# Patient Record
Sex: Male | Born: 1965 | Race: White | Hispanic: No | Marital: Married | Smoking: Never smoker
Health system: Southern US, Community
[De-identification: ages and names within clinical notes are randomized; demographics above are authoritative.]

## PROBLEM LIST (undated history)

## (undated) DIAGNOSIS — E785 Hyperlipidemia, unspecified: Secondary | ICD-10-CM

## (undated) DIAGNOSIS — E669 Obesity, unspecified: Secondary | ICD-10-CM

## (undated) DIAGNOSIS — R768 Other specified abnormal immunological findings in serum: Secondary | ICD-10-CM

## (undated) DIAGNOSIS — M79641 Pain in right hand: Secondary | ICD-10-CM

## (undated) DIAGNOSIS — Z0000 Encounter for general adult medical examination without abnormal findings: Secondary | ICD-10-CM

## (undated) DIAGNOSIS — G709 Myoneural disorder, unspecified: Secondary | ICD-10-CM

## (undated) DIAGNOSIS — R519 Headache, unspecified: Secondary | ICD-10-CM

## (undated) DIAGNOSIS — M545 Low back pain: Secondary | ICD-10-CM

## (undated) DIAGNOSIS — F32A Depression, unspecified: Secondary | ICD-10-CM

## (undated) DIAGNOSIS — T753XXA Motion sickness, initial encounter: Secondary | ICD-10-CM

## (undated) DIAGNOSIS — M25561 Pain in right knee: Secondary | ICD-10-CM

## (undated) DIAGNOSIS — Z9889 Other specified postprocedural states: Secondary | ICD-10-CM

## (undated) DIAGNOSIS — N529 Male erectile dysfunction, unspecified: Secondary | ICD-10-CM

## (undated) DIAGNOSIS — G959 Disease of spinal cord, unspecified: Secondary | ICD-10-CM

## (undated) DIAGNOSIS — R112 Nausea with vomiting, unspecified: Secondary | ICD-10-CM

## (undated) DIAGNOSIS — Z86010 Personal history of colonic polyps: Secondary | ICD-10-CM

## (undated) DIAGNOSIS — R51 Headache: Secondary | ICD-10-CM

## (undated) DIAGNOSIS — G4730 Sleep apnea, unspecified: Secondary | ICD-10-CM

## (undated) DIAGNOSIS — R42 Dizziness and giddiness: Secondary | ICD-10-CM

## (undated) HISTORY — DX: Other specified abnormal immunological findings in serum: R76.8

## (undated) HISTORY — DX: Personal history of colonic polyps: Z86.010

## (undated) HISTORY — DX: Pain in right knee: M25.561

## (undated) HISTORY — PX: POLYPECTOMY: SHX149

## (undated) HISTORY — DX: Myoneural disorder, unspecified: G70.9

## (undated) HISTORY — DX: Dizziness and giddiness: R42

## (undated) HISTORY — PX: SPINE SURGERY: SHX786

## (undated) HISTORY — DX: Other specified postprocedural states: Z98.890

## (undated) HISTORY — DX: Depression, unspecified: F32.A

## (undated) HISTORY — DX: Obesity, unspecified: E66.9

## (undated) HISTORY — DX: Pain in right hand: M79.641

## (undated) HISTORY — DX: Encounter for general adult medical examination without abnormal findings: Z00.00

## (undated) HISTORY — DX: Motion sickness, initial encounter: T75.3XXA

## (undated) HISTORY — DX: Low back pain: M54.5

## (undated) HISTORY — PX: SKIN BIOPSY: SHX1

## (undated) HISTORY — DX: Hyperlipidemia, unspecified: E78.5

## (undated) HISTORY — DX: Headache: R51

## (undated) HISTORY — DX: Sleep apnea, unspecified: G47.30

## (undated) HISTORY — DX: Other specified postprocedural states: R11.2

## (undated) HISTORY — PX: VARICOCELE EXCISION: SUR582

## (undated) HISTORY — PX: VASECTOMY: SHX75

## (undated) HISTORY — DX: Headache, unspecified: R51.9

## (undated) HISTORY — DX: Male erectile dysfunction, unspecified: N52.9

---

## 2009-04-29 LAB — HM COLONOSCOPY

## 2010-04-29 HISTORY — PX: COLONOSCOPY: SHX174

## 2014-03-15 ENCOUNTER — Encounter (HOSPITAL_BASED_OUTPATIENT_CLINIC_OR_DEPARTMENT_OTHER): Payer: Self-pay

## 2014-03-15 ENCOUNTER — Emergency Department (HOSPITAL_BASED_OUTPATIENT_CLINIC_OR_DEPARTMENT_OTHER): Payer: 59

## 2014-03-15 ENCOUNTER — Emergency Department (HOSPITAL_BASED_OUTPATIENT_CLINIC_OR_DEPARTMENT_OTHER)
Admission: EM | Admit: 2014-03-15 | Discharge: 2014-03-15 | Disposition: A | Discharge status: Home / Self Care | Payer: 59 | Attending: Emergency Medicine | Admitting: Emergency Medicine

## 2014-03-15 DIAGNOSIS — R110 Nausea: Secondary | ICD-10-CM | POA: Diagnosis not present

## 2014-03-15 DIAGNOSIS — R51 Headache: Secondary | ICD-10-CM | POA: Insufficient documentation

## 2014-03-15 DIAGNOSIS — H538 Other visual disturbances: Secondary | ICD-10-CM | POA: Diagnosis not present

## 2014-03-15 DIAGNOSIS — R42 Dizziness and giddiness: Secondary | ICD-10-CM

## 2014-03-15 LAB — CBC WITH DIFFERENTIAL/PLATELET
Basophils Absolute: 0 10*3/uL (ref 0.0–0.1)
Basophils Relative: 0 % (ref 0–1)
Eosinophils Absolute: 0.2 10*3/uL (ref 0.0–0.7)
Eosinophils Relative: 4 % (ref 0–5)
HEMATOCRIT: 43.5 % (ref 39.0–52.0)
Hemoglobin: 14.9 g/dL (ref 13.0–17.0)
LYMPHS PCT: 26 % (ref 12–46)
Lymphs Abs: 1.5 10*3/uL (ref 0.7–4.0)
MCH: 31.3 pg (ref 26.0–34.0)
MCHC: 34.3 g/dL (ref 30.0–36.0)
MCV: 91.4 fL (ref 78.0–100.0)
MONO ABS: 0.5 10*3/uL (ref 0.1–1.0)
MONOS PCT: 9 % (ref 3–12)
NEUTROS ABS: 3.6 10*3/uL (ref 1.7–7.7)
Neutrophils Relative %: 61 % (ref 43–77)
Platelets: 215 10*3/uL (ref 150–400)
RBC: 4.76 MIL/uL (ref 4.22–5.81)
RDW: 12.6 % (ref 11.5–15.5)
WBC: 5.9 10*3/uL (ref 4.0–10.5)

## 2014-03-15 LAB — COMPREHENSIVE METABOLIC PANEL
ALT: 18 U/L (ref 0–53)
AST: 21 U/L (ref 0–37)
Albumin: 4.2 g/dL (ref 3.5–5.2)
Alkaline Phosphatase: 80 U/L (ref 39–117)
Anion gap: 13 (ref 5–15)
BUN: 14 mg/dL (ref 6–23)
CO2: 27 mEq/L (ref 19–32)
Calcium: 9.4 mg/dL (ref 8.4–10.5)
Chloride: 102 mEq/L (ref 96–112)
Creatinine, Ser: 1 mg/dL (ref 0.50–1.35)
GFR calc Af Amer: >90 mL/min (ref >90)
GFR calc non Af Amer: 86 mL/min — ABNORMAL LOW (ref >90)
Glucose, Bld: 97 mg/dL (ref 70–99)
Potassium: 3.9 mEq/L (ref 3.7–5.3)
Sodium: 142 mEq/L (ref 137–147)
Total Bilirubin: 0.8 mg/dL (ref 0.3–1.2)
Total Protein: 7.5 g/dL (ref 6.0–8.3)

## 2014-03-15 NOTE — ED Notes (Signed)
Pt ambulated independently to vision chart, denied dizziness.

## 2014-03-15 NOTE — Discharge Instructions (Signed)
Your CT scan today does not show bleeding into your brain or a tumor, however, it is important that you follow up with a Neurologist for further evaluation of your dizziness.

## 2014-03-15 NOTE — ED Provider Notes (Signed)
CSN: [id number]     Arrival date & time 03/15/14  1348 History   First MD Initiated Contact with Patient 03/15/14 1414     Chief Complaint  Patient presents with  . Dizziness     (Consider location/radiation/quality/duration/timing/severity/associated sxs/prior Treatment) Patient is a 48 y.o. male presenting with dizziness.  Dizziness Onset quality:  Gradual Duration:  6 weeks Timing:  Intermittent Progression:  Worsening Chronicity:  New Context: head movement   Relieved by:  Closing eyes Worsened by:  Movement Associated symptoms: headaches, nausea and vision changes   Associated symptoms: no chest pain, no diarrhea, no palpitations, no shortness of breath and no vomiting    Ronald Griffin is a 48 y.o. male who presents to the ED with dizziness that has been off and on x 6 weeks. He describes the symptoms as nausea with a dizzy feeling that comes and goes with occasional episode of blurred vision but other times no visual problems at all. He describes his symptoms as like car sickness with a mild headache. The light headache is constant but the dizziness comes and goes. He denies any focal weakness. Patient went to the eye doctor and and had a check up and they recommended OTC eye glasses but nothing else. Patient does not currently have a PCP. He move here from [place] about a year ago and has not had any problems.   History reviewed. No pertinent past medical history. Past Surgical History  Procedure Laterality Date  . Varicocele excision     No family history on file. History  Substance Use Topics  . Smoking status: Never Smoker   . Smokeless tobacco: Not on file  . Alcohol Use: Yes     Comment: weekly    Review of Systems  Constitutional: Negative for fever and chills.  Eyes: Positive for visual disturbance. Negative for photophobia, pain and itching.  Respiratory: Negative for cough, chest tightness, shortness of breath and wheezing.   Cardiovascular: Negative for chest  pain, palpitations and leg swelling.  Gastrointestinal: Positive for nausea. Negative for vomiting, abdominal pain, diarrhea and constipation.  Genitourinary: Negative for dysuria, frequency and decreased urine volume.  Musculoskeletal: Negative for myalgias, back pain and joint swelling.  Skin: Negative for rash.  Neurological: Positive for dizziness, light-headedness and headaches. Negative for seizures, syncope, facial asymmetry and speech difficulty.  Psychiatric/Behavioral: Negative for confusion. The patient is not nervous/anxious.       Allergies  Review of patient's allergies indicates no known allergies.  Home Medications   Prior to Admission medications   Not on File   BP 121/72 mmHg  Pulse 57  Temp(Src) 98.8 F (37.1 C) (Oral)  Resp 18  Ht 5\' 10"  (1.778 m)  Wt 221 lb (100.245 kg)  BMI 31.71 kg/m2  SpO2 97% Physical Exam  Constitutional: He is oriented to person, place, and time. He appears well-developed and well-nourished. No distress.  HENT:  Head: Normocephalic and atraumatic.  Right Ear: Tympanic membrane normal.  Left Ear: Tympanic membrane normal.  Nose: Nose normal.  Mouth/Throat: Uvula is midline, oropharynx is clear and moist and mucous membranes are normal.  Eyes: Conjunctivae and EOM are normal.  Neck: Normal range of motion. Neck supple.  Cardiovascular: Normal rate and regular rhythm.   Pulmonary/Chest: Effort normal. He has no wheezes. He has no rales.  Abdominal: Soft. Bowel sounds are normal. He exhibits no mass. There is no tenderness.  Musculoskeletal: He exhibits no edema.  Radial and pedal pulses strong, adequate circulation, good touch  sensation.  Neurological: He is alert and oriented to person, place, and time. He has normal strength. No cranial nerve deficit or sensory deficit. He displays a negative Romberg sign. Gait normal.  Reflex Scores:      Bicep reflexes are 2+ on the right side and 2+ on the left side.      Brachioradialis  reflexes are 2+ on the right side and 2+ on the left side.      Patellar reflexes are 2+ on the right side and 2+ on the left side.      Achilles reflexes are 2+ on the right side and 2+ on the left side. Rapid alternating movement without difficulty. Stands on one foot without difficulty. Patient felt dizzy when he bent forward and then stood up.  Psychiatric: He has a normal mood and affect. His behavior is normal.    ED Course  Procedures  I discussed this case with Dr. Colin Rhein.  Results for orders placed or performed during the hospital encounter of 03/15/14 (from the past 24 hour(s))  CBC with Differential     Status: None   Collection Time: 03/15/14  2:15 PM  Result Value Ref Range   WBC 5.9 4.0 - 10.5 K/uL   RBC 4.76 4.22 - 5.81 MIL/uL   Hemoglobin 14.9 13.0 - 17.0 g/dL   HCT 43.5 39.0 - 52.0 %   MCV 91.4 78.0 - 100.0 fL   MCH 31.3 26.0 - 34.0 pg   MCHC 34.3 30.0 - 36.0 g/dL   RDW 12.6 11.5 - 15.5 %   Platelets 215 150 - 400 K/uL   Neutrophils Relative % 61 43 - 77 %   Neutro Abs 3.6 1.7 - 7.7 K/uL   Lymphocytes Relative 26 12 - 46 %   Lymphs Abs 1.5 0.7 - 4.0 K/uL   Monocytes Relative 9 3 - 12 %   Monocytes Absolute 0.5 0.1 - 1.0 K/uL   Eosinophils Relative 4 0 - 5 %   Eosinophils Absolute 0.2 0.0 - 0.7 K/uL   Basophils Relative 0 0 - 1 %   Basophils Absolute 0.0 0.0 - 0.1 K/uL  Comprehensive metabolic panel     Status: Abnormal   Collection Time: 03/15/14  2:15 PM  Result Value Ref Range   Sodium 142 137 - 147 mEq/L   Potassium 3.9 3.7 - 5.3 mEq/L   Chloride 102 96 - 112 mEq/L   CO2 27 19 - 32 mEq/L   Glucose, Bld 97 70 - 99 mg/dL   BUN 14 6 - 23 mg/dL   Creatinine, Ser 1.00 0.50 - 1.35 mg/dL   Calcium 9.4 8.4 - 10.5 mg/dL   Total Protein 7.5 6.0 - 8.3 g/dL   Albumin 4.2 3.5 - 5.2 g/dL   AST 21 0 - 37 U/L   ALT 18 0 - 53 U/L   Alkaline Phosphatase 80 39 - 117 U/L   Total Bilirubin 0.8 0.3 - 1.2 mg/dL   GFR calc non Af Amer 86 (L) >90 mL/min   GFR calc Af  Amer >90 >90 mL/min   Anion gap 13 5 - 15    Ct Head Wo Contrast  03/15/2014   CLINICAL DATA:  Headaches, dizziness, no head injury  EXAM: CT HEAD WITHOUT CONTRAST  TECHNIQUE: Contiguous axial images were obtained from the base of the skull through the vertex without intravenous contrast.  COMPARISON:  None.  FINDINGS: There is no evidence of mass effect, midline shift or extra-axial fluid collections. There is  no evidence of a space-occupying lesion or intracranial hemorrhage. There is no evidence of a cortical-based area of acute infarction.  The ventricles and sulci are appropriate for the patient's age. The basal cisterns are patent.  Visualized portions of the orbits are unremarkable. The visualized portions of the paranasal sinuses and mastoid air cells are unremarkable.  The osseous structures are unremarkable.  IMPRESSION: Normal CT of the brain without intravenous contrast.   Electronically Signed   By: Kathreen Devoid   On: 03/15/2014 16:41    MDM  48 y.o. male with intermittent dizziness x 6 weeks. Stable for discharge without neuro deficits. I have reviewed this patient's vital signs, nurses notes, appropriate labs and imaging.  I have discussed findings with the patient. I also discussed the importance of follow up with a neurologist for further evaluation and the patient voices understanding and agrees with plan. He will call for an appointment. BP 121/72 mmHg  Pulse 57  Temp(Src) 98.8 F (37.1 C) (Oral)  Resp 18  Ht 5\' 10"  (1.778 m)  Wt 221 lb (100.245 kg)  BMI 31.71 kg/m2  SpO2 97%     [facility], NP 03/15/14 2207  Debby Freiberg, MD 03/16/14 [phone]

## 2014-03-15 NOTE — ED Notes (Signed)
Intermittent dizziness x 6 weeks.  Symptoms seem worse today and associated with blurred vision.

## 2014-03-18 NOTE — ED Notes (Signed)
[name] called to confirm that he did have a referral to [facility].  Reviewed discharge instructions.

## 2014-03-29 ENCOUNTER — Ambulatory Visit (INDEPENDENT_AMBULATORY_CARE_PROVIDER_SITE_OTHER): Payer: 59 | Admitting: Neurology

## 2014-03-29 ENCOUNTER — Encounter: Payer: Self-pay | Admitting: Neurology

## 2014-03-29 VITALS — BP 102/62 | HR 53 | Ht 70.0 in | Wt 230.0 lb

## 2014-03-29 DIAGNOSIS — H538 Other visual disturbances: Secondary | ICD-10-CM | POA: Insufficient documentation

## 2014-03-29 DIAGNOSIS — R51 Headache: Secondary | ICD-10-CM

## 2014-03-29 DIAGNOSIS — R519 Headache, unspecified: Secondary | ICD-10-CM | POA: Insufficient documentation

## 2014-03-29 DIAGNOSIS — R42 Dizziness and giddiness: Secondary | ICD-10-CM

## 2014-03-29 HISTORY — DX: Headache, unspecified: R51.9

## 2014-03-29 NOTE — Progress Notes (Signed)
PATIENT: Ronald Griffin DOB: 11/21/1965  HISTORICAL  Ronald Griffin is a 48 years old right-handed male, referred by emergency room for evaluation of dizziness  He was previously healthy, in March 15 2014, he presented to the emergency room for few weeks history of feeling dizzy, lightheaded, unsteady gait, blurry vision, sometimes worse than the others, also with frequent headaches, CBC CMP was normal, CT head without contrast was normal  Symptom onset was since February 10 2014, progressive, getting more frequent, he works as a International aid/development worker, [place]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   No current facility-administered medications on file prior to visit.    PAST MEDICAL HISTORY: Past Medical History  Diagnosis Date  . Dizziness     PAST SURGICAL HISTORY: Past Surgical History  Procedure Laterality Date  . Varicocele excision      FAMILY HISTORY: Family History  Problem Relation Age of Onset  . Cancer Mother   . Lung cancer Mother   . Breast cancer Mother   . Bipolar disorder Father     SOCIAL HISTORY:  History   Social History  . Marital Status: Married    Spouse Name: [name]    Number of Children: 2  . Years of Education: college   Occupational History  . Juvenile detention   Social History Main Topics  . Smoking status: Never Smoker   . Smokeless tobacco: Never Used  . Alcohol Use: 1.2 oz/week    2 Cans of beer per week     Comment: weekly  . Drug Use: No  . Sexual Activity: Not on file   Other Topics Concern  . Not on file   Social History Narrative   Patient works full time for [employer]. Patient lives at home with his wife  [name]).   Education college.   Right handed.   Caffeine 4-6 cups daily.      PHYSICAL EXAM   Filed Vitals:   03/29/14 0923  BP: 102/62  Pulse: 53  Height: 5\' 10"  (1.778 m)  Weight: 230 lb (104.327 kg)    Not recorded     Blood pressure lying down 130/ out of 80, standing up 130/ 90 Body mass index is 33 kg/(m^2).   Generalized: In no acute distress  Neck: Supple, no carotid bruits   Cardiac: Regular rate rhythm  Pulmonary: Clear to auscultation bilaterally  Musculoskeletal: No deformity  Neurological examination  Mentation: Alert oriented to time, place, history taking, and causual conversation  Cranial nerve II-XII: Pupils were equal round reactive to light. Extraocular movements were full.  Visual field were full on confrontational test. Bilateral fundi were sharp.  Facial sensation and strength were normal. Hearing was intact to finger rubbing bilaterally. Uvula tongue midline.  Head turning and shoulder shrug and were normal and symmetric.Tongue protrusion into cheek strength was normal.  Motor: Normal tone, bulk and strength.  Sensory: Intact to fine touch, pinprick, preserved vibratory sensation, and proprioception at toes.  Coordination: Normal finger to nose, heel-to-shin bilaterally there was no truncal ataxia  Gait: Rising up from seated position without assistance, normal stance, without trunk ataxia, moderate stride, good arm swing, smooth turning, able to perform tiptoe, and heel walking  without difficulty.   Romberg signs: Negative  Deep tendon reflexes: Brachioradialis 2/2, biceps 2/2, triceps 2/2, patellar 2/2, Achilles 2/2, plantar responses were flexor bilaterally.   DIAGNOSTIC DATA (LABS, IMAGING, TESTING) - I reviewed patient records, labs, notes, testing and imaging myself where available.  Lab Results  Component Value Date   WBC 5.9 03/15/2014   HGB 14.9 03/15/2014   HCT 43.5 03/15/2014   MCV 91.4 03/15/2014   PLT 215 03/15/2014       Component Value Date/Time   NA 142 03/15/2014 1415   K 3.9 03/15/2014 1415   CL 102 03/15/2014 1415   CO2 27 03/15/2014 1415   GLUCOSE 97 03/15/2014 1415   BUN 14 03/15/2014 1415   CREATININE 1.00 03/15/2014 1415   CALCIUM 9.4 03/15/2014 1415   PROT 7.5 03/15/2014 1415   ALBUMIN 4.2 03/15/2014 1415   AST 21 03/15/2014 1415   ALT 18 03/15/2014 1415   ALKPHOS 80 03/15/2014 1415   BILITOT 0.8 03/15/2014 1415   GFRNONAA 86* 03/15/2014 1415   GFRAA >90 03/15/2014 1415   ASSESSMENT AND PLAN  Ronald Griffin is a 48 y.o. male complains of  few months history of intermittent dizziness, not feeling well, frequent headaches, Normal neurological examination, there was no orthostatic blood pressure changes,  1, need to rule out central nervous system, brainstem/cerebellum structural lesions, MRI of brain  2. Laboratory evaluations 3. Return to clinic in 2 weeks   Marcial Pacas, M.D. Ph.D.  [facility] [address], [place], [place] [address] [phone]

## 2014-03-31 ENCOUNTER — Telehealth: Payer: Self-pay | Admitting: Neurology

## 2014-03-31 NOTE — Telephone Encounter (Signed)
I have called him, left message, may repeat lab test for positive ANA, SSB, keep follow up appt in Dec 15th

## 2014-04-12 ENCOUNTER — Encounter: Payer: Self-pay | Admitting: *Deleted

## 2014-04-12 ENCOUNTER — Ambulatory Visit: Payer: 59 | Admitting: Neurology

## 2014-04-12 LAB — ANA W/REFLEX IF POSITIVE
ANA: POSITIVE — AB
Anti JO-1: <0.2 AI (ref 0.0–0.9)
Centromere Ab Screen: <0.2 AI (ref 0.0–0.9)
Chromatin Ab SerPl-aCnc: <0.2 AI (ref 0.0–0.9)
ENA RNP Ab: 0.2 AI (ref 0.0–0.9)
ENA SSA (RO) Ab: <0.2 AI (ref 0.0–0.9)
ENA SSB (LA) Ab: 1.3 AI — ABNORMAL HIGH (ref 0.0–0.9)
dsDNA Ab: <1 IU/mL (ref 0–9)

## 2014-04-12 LAB — C-REACTIVE PROTEIN: CRP: <0.1 mg/L (ref 0.0–4.9)

## 2014-04-12 LAB — THYROID PANEL WITH TSH
FREE THYROXINE INDEX: 1.5 (ref 1.2–4.9)
T3 Uptake Ratio: 30 % (ref 24–39)
T4, Total: 5.1 ug/dL (ref 4.5–12.0)
TSH: 3.39 u[IU]/mL (ref 0.450–4.500)

## 2014-04-12 LAB — ACETYLCHOLINE RECEPTOR, MODULATING

## 2014-04-12 LAB — ACETYLCHOLINE RECEPTOR, BINDING

## 2014-04-12 LAB — SEDIMENTATION RATE: Sed Rate: 6 mm/hr (ref 0–30)

## 2014-04-12 LAB — VITAMIN B12: VITAMIN B 12: 379 pg/mL (ref 211–946)

## 2014-04-15 ENCOUNTER — Telehealth: Payer: Self-pay | Admitting: Neurology

## 2014-04-15 NOTE — Telephone Encounter (Signed)
I have called and left message, will try again later for blood test result, only abnormality is positive ANA, SSB,   MRI brain is pending

## 2014-04-20 ENCOUNTER — Ambulatory Visit (INDEPENDENT_AMBULATORY_CARE_PROVIDER_SITE_OTHER): Payer: 59

## 2014-04-20 DIAGNOSIS — R42 Dizziness and giddiness: Secondary | ICD-10-CM

## 2014-04-20 DIAGNOSIS — H538 Other visual disturbances: Secondary | ICD-10-CM

## 2014-04-27 ENCOUNTER — Telehealth: Payer: Self-pay

## 2014-04-27 NOTE — Progress Notes (Signed)
Quick Note:  Spoke to patient told him Normal MRI. ______

## 2014-04-27 NOTE — Telephone Encounter (Signed)
Spoke to patient and told him MRI was Normal and he wants to know what the next step is.

## 2014-05-03 NOTE — Telephone Encounter (Signed)
I have called patient, he has tried Estate agent, he feels better.  MRI brain is normal.  Only abnormality is positive ANA, SSB,  He is in the process of finding a primary care physician, I have advised him continue moderate exercise, relay laboratory and imaging findings to his primary care physician

## 2014-11-18 ENCOUNTER — Telehealth: Payer: Self-pay | Admitting: Behavioral Health

## 2014-11-18 NOTE — Telephone Encounter (Signed)
Unable to reach patient at time of Pre-Visit Call.  Left message for patient to return call when available.    

## 2014-11-21 ENCOUNTER — Ambulatory Visit (INDEPENDENT_AMBULATORY_CARE_PROVIDER_SITE_OTHER): Payer: 59 | Admitting: Family Medicine

## 2014-11-21 ENCOUNTER — Encounter: Payer: Self-pay | Admitting: Family Medicine

## 2014-11-21 VITALS — BP 110/76 | HR 60 | Temp 98.0°F | Resp 18 | Ht 71.0 in | Wt 222.0 lb

## 2014-11-21 DIAGNOSIS — T753XXA Motion sickness, initial encounter: Secondary | ICD-10-CM

## 2014-11-21 DIAGNOSIS — Z860100 Personal history of colon polyps, unspecified: Secondary | ICD-10-CM

## 2014-11-21 DIAGNOSIS — M25561 Pain in right knee: Secondary | ICD-10-CM | POA: Insufficient documentation

## 2014-11-21 DIAGNOSIS — R768 Other specified abnormal immunological findings in serum: Secondary | ICD-10-CM

## 2014-11-21 DIAGNOSIS — E785 Hyperlipidemia, unspecified: Secondary | ICD-10-CM | POA: Diagnosis not present

## 2014-11-21 DIAGNOSIS — Z8042 Family history of malignant neoplasm of prostate: Secondary | ICD-10-CM

## 2014-11-21 DIAGNOSIS — Z0000 Encounter for general adult medical examination without abnormal findings: Secondary | ICD-10-CM | POA: Diagnosis not present

## 2014-11-21 DIAGNOSIS — Z1211 Encounter for screening for malignant neoplasm of colon: Secondary | ICD-10-CM

## 2014-11-21 DIAGNOSIS — N529 Male erectile dysfunction, unspecified: Secondary | ICD-10-CM

## 2014-11-21 HISTORY — DX: Other specified abnormal immunological findings in serum: R76.8

## 2014-11-21 HISTORY — DX: Personal history of colonic polyps: Z86.010

## 2014-11-21 HISTORY — DX: Motion sickness, initial encounter: T75.3XXA

## 2014-11-21 HISTORY — DX: Pain in right knee: M25.561

## 2014-11-21 HISTORY — DX: Encounter for general adult medical examination without abnormal findings: Z00.00

## 2014-11-21 HISTORY — DX: Male erectile dysfunction, unspecified: N52.9

## 2014-11-21 HISTORY — DX: Personal history of colon polyps, unspecified: Z86.0100

## 2014-11-21 LAB — CBC
HCT: 41.5 % (ref 39.0–52.0)
HEMOGLOBIN: 13.9 g/dL (ref 13.0–17.0)
MCHC: 33.5 g/dL (ref 30.0–36.0)
MCV: 92.3 fl (ref 78.0–100.0)
PLATELETS: 211 10*3/uL (ref 150.0–400.0)
RBC: 4.5 Mil/uL (ref 4.22–5.81)
RDW: 13.4 % (ref 11.5–15.5)
WBC: 5.3 10*3/uL (ref 4.0–10.5)

## 2014-11-21 LAB — COMPREHENSIVE METABOLIC PANEL
ALBUMIN: 4.5 g/dL (ref 3.5–5.2)
ALK PHOS: 62 U/L (ref 39–117)
ALT: 13 U/L (ref 0–53)
AST: 15 U/L (ref 0–37)
BUN: 15 mg/dL (ref 6–23)
CHLORIDE: 106 meq/L (ref 96–112)
CO2: 27 mEq/L (ref 19–32)
Calcium: 9.3 mg/dL (ref 8.4–10.5)
Creatinine, Ser: 0.86 mg/dL (ref 0.40–1.50)
GFR: 99.71 mL/min (ref >60.00)
Glucose, Bld: 94 mg/dL (ref 70–99)
Potassium: 4.1 mEq/L (ref 3.5–5.1)
SODIUM: 141 meq/L (ref 135–145)
Total Bilirubin: 0.7 mg/dL (ref 0.2–1.2)
Total Protein: 6.6 g/dL (ref 6.0–8.3)

## 2014-11-21 LAB — LIPID PANEL
Cholesterol: 181 mg/dL (ref 0–200)
HDL: 49.4 mg/dL (ref >39.00)
LDL CALC: 108 mg/dL — AB (ref 0–99)
NonHDL: 131.6
Total CHOL/HDL Ratio: 4
Triglycerides: 118 mg/dL (ref 0.0–149.0)
VLDL: 23.6 mg/dL (ref 0.0–40.0)

## 2014-11-21 LAB — TSH: TSH: 2.84 u[IU]/mL (ref 0.35–4.50)

## 2014-11-21 LAB — PSA: PSA: 0.5 ng/mL (ref 0.10–4.00)

## 2014-11-21 MED ORDER — TADALAFIL 5 MG PO TABS: 5.0000 mg | ORAL_TABLET | Freq: Every day | ORAL | Status: DC | PRN

## 2014-11-21 MED ORDER — SCOPOLAMINE 1 MG/3DAYS TD PT72: 1.0000 | MEDICATED_PATCH | TRANSDERMAL | Status: DC

## 2014-11-21 NOTE — Progress Notes (Signed)
Pre visit review using our clinic review tool, if applicable. No additional management support is needed unless otherwise documented below in the visit note. 

## 2014-11-21 NOTE — Assessment & Plan Note (Signed)
Was having daily headaches in December and then had an episode of blurry vision which prompted a work up which was negative. Was persistent til last month. With 10 pound weight loss, massage therapy and chiropractic now headaches are better. Encouraged moist heat and gentle stretching as tolerated. May try NSAIDs and prescription meds as directed and report if symptoms worsen or seek immediate care, report if symptoms return

## 2014-11-21 NOTE — Assessment & Plan Note (Signed)
Referred to gastroenterology to for surveillance

## 2014-11-21 NOTE — Assessment & Plan Note (Signed)
Going on a cruise, given Scopolamine patches to use prn

## 2014-11-21 NOTE — Assessment & Plan Note (Signed)
Encouraged heart healthy diet, increase exercise, avoid trans fats, consider a krill oil cap daily, check lipids

## 2014-11-21 NOTE — Patient Instructions (Signed)
Preventive Care for Adults A healthy lifestyle and preventive care can promote health and wellness. Preventive health guidelines for men include the following key practices:  A routine yearly physical is a good way to check with your health care provider about your health and preventative screening. It is a chance to share any concerns and updates on your health and to receive a thorough exam.  Visit your dentist for a routine exam and preventative care every 6 months. Brush your teeth twice a day and floss once a day. Good oral hygiene prevents tooth decay and gum disease.  The frequency of eye exams is based on your age, health, family medical history, use of contact lenses, and other factors. Follow your health care provider's recommendations for frequency of eye exams.  Eat a healthy diet. Foods such as vegetables, fruits, whole grains, low-fat dairy products, and lean protein foods contain the nutrients you need without too many calories. Decrease your intake of foods high in solid fats, added sugars, and salt. Eat the right amount of calories for you.Get information about a proper diet from your health care provider, if necessary.  Regular physical exercise is one of the most important things you can do for your health. Most adults should get at least 150 minutes of moderate-intensity exercise (any activity that increases your heart rate and causes you to sweat) each week. In addition, most adults need muscle-strengthening exercises on 2 or more days a week.  Maintain a healthy weight. The body mass index (BMI) is a screening tool to identify possible weight problems. It provides an estimate of body fat based on height and weight. Your health care provider can find your BMI and can help you achieve or maintain a healthy weight.For adults 20 years and older:  A BMI below 18.5 is considered underweight.  A BMI of 18.5 to 24.9 is normal.  A BMI of 25 to 29.9 is considered overweight.  A BMI  of 30 and above is considered obese.  Maintain normal blood lipids and cholesterol levels by exercising and minimizing your intake of saturated fat. Eat a balanced diet with plenty of fruit and vegetables. Blood tests for lipids and cholesterol should begin at age 50 and be repeated every 5 years. If your lipid or cholesterol levels are high, you are over 50, or you are at high risk for heart disease, you may need your cholesterol levels checked more frequently.Ongoing high lipid and cholesterol levels should be treated with medicines if diet and exercise are not working.  If you smoke, find out from your health care provider how to quit. If you do not use tobacco, do not start.  Lung cancer screening is recommended for adults aged 73-80 years who are at high risk for developing lung cancer because of a history of smoking. A yearly low-dose CT scan of the lungs is recommended for people who have at least a 30-pack-year history of smoking and are a current smoker or have quit within the past 15 years. A pack year of smoking is smoking an average of 1 pack of cigarettes a day for 1 year (for example: 1 pack a day for 30 years or 2 packs a day for 15 years). Yearly screening should continue until the smoker has stopped smoking for at least 15 years. Yearly screening should be stopped for people who develop a health problem that would prevent them from having lung cancer treatment.  If you choose to drink alcohol, do not have more than  2 drinks per day. One drink is considered to be 12 ounces (355 mL) of beer, 5 ounces (148 mL) of wine, or 1.5 ounces (44 mL) of liquor.  Avoid use of street drugs. Do not share needles with anyone. Ask for help if you need support or instructions about stopping the use of drugs.  High blood pressure causes heart disease and increases the risk of stroke. Your blood pressure should be checked at least every 1-2 years. Ongoing high blood pressure should be treated with  medicines, if weight loss and exercise are not effective.  If you are 45-79 years old, ask your health care provider if you should take aspirin to prevent heart disease.  Diabetes screening involves taking a blood sample to check your fasting blood sugar level. This should be done once every 3 years, after age 45, if you are within normal weight and without risk factors for diabetes. Testing should be considered at a younger age or be carried out more frequently if you are overweight and have at least 1 risk factor for diabetes.  Colorectal cancer can be detected and often prevented. Most routine colorectal cancer screening begins at the age of 50 and continues through age 75. However, your health care provider may recommend screening at an earlier age if you have risk factors for colon cancer. On a yearly basis, your health care provider may provide home test kits to check for hidden blood in the stool. Use of a small camera at the end of a tube to directly examine the colon (sigmoidoscopy or colonoscopy) can detect the earliest forms of colorectal cancer. Talk to your health care provider about this at age 50, when routine screening begins. Direct exam of the colon should be repeated every 5-10 years through age 75, unless early forms of precancerous polyps or small growths are found.  People who are at an increased risk for hepatitis B should be screened for this virus. You are considered at high risk for hepatitis B if:  You were born in a country where hepatitis B occurs often. Talk with your health care provider about which countries are considered high risk.  Your parents were born in a high-risk country and you have not received a shot to protect against hepatitis B (hepatitis B vaccine).  You have HIV or AIDS.  You use needles to inject street drugs.  You live with, or have sex with, someone who has hepatitis B.  You are a man who has sex with other men (MSM).  You get hemodialysis  treatment.  You take certain medicines for conditions such as cancer, organ transplantation, and autoimmune conditions.  Hepatitis C blood testing is recommended for all people born from 1945 through 1965 and any individual with known risks for hepatitis C.  Practice safe sex. Use condoms and avoid high-risk sexual practices to reduce the spread of sexually transmitted infections (STIs). STIs include gonorrhea, chlamydia, syphilis, trichomonas, herpes, HPV, and human immunodeficiency virus (HIV). Herpes, HIV, and HPV are viral illnesses that have no cure. They can result in disability, cancer, and death.  If you are at risk of being infected with HIV, it is recommended that you take a prescription medicine daily to prevent HIV infection. This is called preexposure prophylaxis (PrEP). You are considered at risk if:  You are a man who has sex with other men (MSM) and have other risk factors.  You are a heterosexual man, are sexually active, and are at increased risk for HIV infection.    You take drugs by injection.  You are sexually active with a partner who has HIV.  Talk with your health care provider about whether you are at high risk of being infected with HIV. If you choose to begin PrEP, you should first be tested for HIV. You should then be tested every 3 months for as long as you are taking PrEP.  A one-time screening for abdominal aortic aneurysm (AAA) and surgical repair of large AAAs by ultrasound are recommended for men ages 32 to 67 years who are current or former smokers.  Healthy men should no longer receive prostate-specific antigen (PSA) blood tests as part of routine cancer screening. Talk with your health care provider about prostate cancer screening.  Testicular cancer screening is not recommended for adult males who have no symptoms. Screening includes self-exam, a health care provider exam, and other screening tests. Consult with your health care provider about any symptoms  you have or any concerns you have about testicular cancer.  Use sunscreen. Apply sunscreen liberally and repeatedly throughout the day. You should seek shade when your shadow is shorter than you. Protect yourself by wearing long sleeves, pants, a wide-brimmed hat, and sunglasses year round, whenever you are outdoors.  Once a month, do a whole-body skin exam, using a mirror to look at the skin on your back. Tell your health care provider about new moles, moles that have irregular borders, moles that are larger than a pencil eraser, or moles that have changed in shape or color.  Stay current with required vaccines (immunizations).  Influenza vaccine. All adults should be immunized every year.  Tetanus, diphtheria, and acellular pertussis (Td, Tdap) vaccine. An adult who has not previously received Tdap or who does not know his vaccine status should receive 1 dose of Tdap. This initial dose should be followed by tetanus and diphtheria toxoids (Td) booster doses every 10 years. Adults with an unknown or incomplete history of completing a 3-dose immunization series with Td-containing vaccines should begin or complete a primary immunization series including a Tdap dose. Adults should receive a Td booster every 10 years.  Varicella vaccine. An adult without evidence of immunity to varicella should receive 2 doses or a second dose if he has previously received 1 dose.  Human papillomavirus (HPV) vaccine. Males aged 68-21 years who have not received the vaccine previously should receive the 3-dose series. Males aged 22-26 years may be immunized. Immunization is recommended through the age of 6 years for any male who has sex with males and did not get any or all doses earlier. Immunization is recommended for any person with an immunocompromised condition through the age of 49 years if he did not get any or all doses earlier. During the 3-dose series, the second dose should be obtained 4-8 weeks after the first  dose. The third dose should be obtained 24 weeks after the first dose and 16 weeks after the second dose.  Zoster vaccine. One dose is recommended for adults aged 50 years or older unless certain conditions are present.  Measles, mumps, and rubella (MMR) vaccine. Adults born before [birth year] generally are considered immune to measles and mumps. Adults born in [birth year] or later should have 1 or more doses of MMR vaccine unless there is a contraindication to the vaccine or there is laboratory evidence of immunity to each of the three diseases. A routine second dose of MMR vaccine should be obtained at least 28 days after the first dose for students attending postsecondary  schools, health care workers, or international travelers. People who received inactivated measles vaccine or an unknown type of measles vaccine during 1963-1967 should receive 2 doses of MMR vaccine. People who received inactivated mumps vaccine or an unknown type of mumps vaccine before 1979 and are at high risk for mumps infection should consider immunization with 2 doses of MMR vaccine. Unvaccinated health care workers born before 1957 who lack laboratory evidence of measles, mumps, or rubella immunity or laboratory confirmation of disease should consider measles and mumps immunization with 2 doses of MMR vaccine or rubella immunization with 1 dose of MMR vaccine.  Pneumococcal 13-valent conjugate (PCV13) vaccine. When indicated, a person who is uncertain of his immunization history and has no record of immunization should receive the PCV13 vaccine. An adult aged 19 years or older who has certain medical conditions and has not been previously immunized should receive 1 dose of PCV13 vaccine. This PCV13 should be followed with a dose of pneumococcal polysaccharide (PPSV23) vaccine. The PPSV23 vaccine dose should be obtained at least 8 weeks after the dose of PCV13 vaccine. An adult aged 19 years or older who has certain medical conditions and  previously received 1 or more doses of PPSV23 vaccine should receive 1 dose of PCV13. The PCV13 vaccine dose should be obtained 1 or more years after the last PPSV23 vaccine dose.  Pneumococcal polysaccharide (PPSV23) vaccine. When PCV13 is also indicated, PCV13 should be obtained first. All adults aged 65 years and older should be immunized. An adult younger than age 65 years who has certain medical conditions should be immunized. Any person who resides in a nursing home or long-term care facility should be immunized. An adult smoker should be immunized. People with an immunocompromised condition and certain other conditions should receive both PCV13 and PPSV23 vaccines. People with human immunodeficiency virus (HIV) infection should be immunized as soon as possible after diagnosis. Immunization during chemotherapy or radiation therapy should be avoided. Routine use of PPSV23 vaccine is not recommended for American Indians, [place] Natives, or people younger than 65 years unless there are medical conditions that require PPSV23 vaccine. When indicated, people who have unknown immunization and have no record of immunization should receive PPSV23 vaccine. One-time revaccination 5 years after the first dose of PPSV23 is recommended for people aged 19-64 years who have chronic kidney failure, nephrotic syndrome, asplenia, or immunocompromised conditions. People who received 1-2 doses of PPSV23 before age 65 years should receive another dose of PPSV23 vaccine at age 65 years or later if at least 5 years have passed since the previous dose. Doses of PPSV23 are not needed for people immunized with PPSV23 at or after age 65 years.  Meningococcal vaccine. Adults with asplenia or persistent complement component deficiencies should receive 2 doses of quadrivalent meningococcal conjugate (MenACWY-D) vaccine. The doses should be obtained at least 2 months apart. Microbiologists working with certain meningococcal bacteria,  military recruits, people at risk during an outbreak, and people who travel to or live in countries with a high rate of meningitis should be immunized. A first-year college student up through age 21 years who is living in a residence hall should receive a dose if he did not receive a dose on or after his 16th birthday. Adults who have certain high-risk conditions should receive one or more doses of vaccine.  Hepatitis A vaccine. Adults who wish to be protected from this disease, have certain high-risk conditions, work with hepatitis A-infected animals, work in hepatitis A research labs, or   travel to or work in countries with a high rate of hepatitis A should be immunized. Adults who were previously unvaccinated and who anticipate close contact with an international adoptee during the first 60 days after arrival in the [place] from a country with a high rate of hepatitis A should be immunized.  Hepatitis B vaccine. Adults should be immunized if they wish to be protected from this disease, have certain high-risk conditions, may be exposed to blood or other infectious body fluids, are household contacts or sex partners of hepatitis B positive people, are clients or workers in certain care facilities, or travel to or work in countries with a high rate of hepatitis B.  Haemophilus influenzae type b (Hib) vaccine. A previously unvaccinated person with asplenia or sickle cell disease or having a scheduled splenectomy should receive 1 dose of Hib vaccine. Regardless of previous immunization, a recipient of a hematopoietic stem cell transplant should receive a 3-dose series 6-12 months after his successful transplant. Hib vaccine is not recommended for adults with HIV infection. Preventive Service / Frequency Ages 52 to 17  Blood pressure check.** / Every 1 to 2 years.  Lipid and cholesterol check.** / Every 5 years beginning at age 69.  Hepatitis C blood test.** / For any individual with known risks for  hepatitis C.  Skin self-exam. / Monthly.  Influenza vaccine. / Every year.  Tetanus, diphtheria, and acellular pertussis (Tdap, Td) vaccine.** / Consult your health care provider. 1 dose of Td every 10 years.  Varicella vaccine.** / Consult your health care provider.  HPV vaccine. / 3 doses over 6 months, if 72 or younger.  Measles, mumps, rubella (MMR) vaccine.** / You need at least 1 dose of MMR if you were born in 1957 or later. You may also need a second dose.  Pneumococcal 13-valent conjugate (PCV13) vaccine.** / Consult your health care provider.  Pneumococcal polysaccharide (PPSV23) vaccine.** / 1 to 2 doses if you smoke cigarettes or if you have certain conditions.  Meningococcal vaccine.** / 1 dose if you are age 35 to 60 years and a Market researcher living in a residence hall, or have one of several medical conditions. You may also need additional booster doses.  Hepatitis A vaccine.** / Consult your health care provider.  Hepatitis B vaccine.** / Consult your health care provider.  Haemophilus influenzae type b (Hib) vaccine.** / Consult your health care provider. Ages 35 to 8  Blood pressure check.** / Every 1 to 2 years.  Lipid and cholesterol check.** / Every 5 years beginning at age 57.  Lung cancer screening. / Every year if you are aged 44-80 years and have a 30-pack-year history of smoking and currently smoke or have quit within the past 15 years. Yearly screening is stopped once you have quit smoking for at least 15 years or develop a health problem that would prevent you from having lung cancer treatment.  Fecal occult blood test (FOBT) of stool. / Every year beginning at age 55 and continuing until age 73. You may not have to do this test if you get a colonoscopy every 10 years.  Flexible sigmoidoscopy** or colonoscopy.** / Every 5 years for a flexible sigmoidoscopy or every 10 years for a colonoscopy beginning at age 28 and continuing until age  1.  Hepatitis C blood test.** / For all people born from [birth year] through 1965 and any individual with known risks for hepatitis C.  Skin self-exam. / Monthly.  Influenza vaccine. / Every  year.  Tetanus, diphtheria, and acellular pertussis (Tdap/Td) vaccine.** / Consult your health care provider. 1 dose of Td every 10 years.  Varicella vaccine.** / Consult your health care provider.  Zoster vaccine.** / 1 dose for adults aged 53 years or older.  Measles, mumps, rubella (MMR) vaccine.** / You need at least 1 dose of MMR if you were born in 1957 or later. You may also need a second dose.  Pneumococcal 13-valent conjugate (PCV13) vaccine.** / Consult your health care provider.  Pneumococcal polysaccharide (PPSV23) vaccine.** / 1 to 2 doses if you smoke cigarettes or if you have certain conditions.  Meningococcal vaccine.** / Consult your health care provider.  Hepatitis A vaccine.** / Consult your health care provider.  Hepatitis B vaccine.** / Consult your health care provider.  Haemophilus influenzae type b (Hib) vaccine.** / Consult your health care provider. Ages 77 and over  Blood pressure check.** / Every 1 to 2 years.  Lipid and cholesterol check.**/ Every 5 years beginning at age 85.  Lung cancer screening. / Every year if you are aged 55-80 years and have a 30-pack-year history of smoking and currently smoke or have quit within the past 15 years. Yearly screening is stopped once you have quit smoking for at least 15 years or develop a health problem that would prevent you from having lung cancer treatment.  Fecal occult blood test (FOBT) of stool. / Every year beginning at age 33 and continuing until age 11. You may not have to do this test if you get a colonoscopy every 10 years.  Flexible sigmoidoscopy** or colonoscopy.** / Every 5 years for a flexible sigmoidoscopy or every 10 years for a colonoscopy beginning at age 28 and continuing until age 73.  Hepatitis C blood  test.** / For all people born from [birth year] through 1965 and any individual with known risks for hepatitis C.  Abdominal aortic aneurysm (AAA) screening.** / A one-time screening for ages 50 to 27 years who are current or former smokers.  Skin self-exam. / Monthly.  Influenza vaccine. / Every year.  Tetanus, diphtheria, and acellular pertussis (Tdap/Td) vaccine.** / 1 dose of Td every 10 years.  Varicella vaccine.** / Consult your health care provider.  Zoster vaccine.** / 1 dose for adults aged 34 years or older.  Pneumococcal 13-valent conjugate (PCV13) vaccine.** / Consult your health care provider.  Pneumococcal polysaccharide (PPSV23) vaccine.** / 1 dose for all adults aged 63 years and older.  Meningococcal vaccine.** / Consult your health care provider.  Hepatitis A vaccine.** / Consult your health care provider.  Hepatitis B vaccine.** / Consult your health care provider.  Haemophilus influenzae type b (Hib) vaccine.** / Consult your health care provider. **Family history and personal history of risk and conditions may change your health care provider's recommendations. Document Released: 06/11/2001 Document Revised: 04/20/2013 Document Reviewed: 09/10/2010 [facility] Patient Information 2015 [place], [place]. This information is not intended to replace advice given to you by your health care provider. Make sure you discuss any questions you have with your health care provider.

## 2014-11-21 NOTE — Assessment & Plan Note (Signed)
Encouraged Curcumen, Krill oil caps and vitamin D daily, report worsening symptoms

## 2014-11-21 NOTE — Progress Notes (Signed)
Ronald Griffin  [id number] 1966/03/30 11/21/2014      Progress Note-Follow Up  Subjective  Chief Complaint  Chief Complaint  Patient presents with  . Establish Care    patient states he has a bad right shoulder, interested in being referred for cortisone shots, would like referral to urology     HPI  Patient is a 49 y.o. male in today for routine medical care. Patient is in today to establish care. Overall feels well but is is in need of a primary care doctor. Reports that he has been told to have colonoscopies every 5 years due to a strong family history of colon cancer and his last colonoscopy was roughly 5 years ago. He denies any bowel concerns or changes in bowel habits. He does note some occasional right knee pain and crepitus especially with exercise but no swelling or warmth. No recent injury but he has done a lot of weight lifting in the past. He needs refill on Cialis which he uses intermittently for ED but he denies any other urologic concerns such as urinary hesitancy. He was struggling with frequent headaches late last year and has been seen by neurology since. Workup did show a positive ANA but he's not complaining of any other significant systemic symptoms at this time and headaches have improved.  Past Medical History  Diagnosis Date  . Dizziness   . Frequent headaches   . Preventative health care 11/21/2014  . History of colonic polyps 11/21/2014  . Headache 03/29/2014  . Hyperlipidemia   . Erectile dysfunction 11/21/2014  . Motion sickness 11/21/2014  . Knee pain, right 11/21/2014    Past Surgical History  Procedure Laterality Date  . Varicocele excision    . Vasectomy    . Skin biopsy      Family History  Problem Relation Age of Onset  . Lung cancer Mother   . Breast cancer Mother   . Cancer Mother     breast age 69s, colon late 94s, lung, liver, skin  . Bipolar disorder Father   . Cancer Father 12    colon cancer  . Dementia Father   . Cancer Maternal  Grandmother     ?  Marland Kitchen Heart disease Maternal Grandfather     MI, sudden  . Stroke Paternal Grandmother 109  . Bipolar disorder Paternal Grandfather   . Dementia Paternal Grandfather   . Cancer Paternal Grandfather     colon  . Obesity Brother     History   Social History  . Marital Status: Married    Spouse Name: Ronald Griffin  . Number of Children: 2  . Years of Education: college   Occupational History  . Not on file.   Social History Main Topics  . Smoking status: Never Smoker   . Smokeless tobacco: Never Used  . Alcohol Use: 1.2 oz/week    2 Cans of beer per week     Comment: weekly  . Drug Use: No  . Sexual Activity: Yes     Comment: lives with wife, works in [employer], part time at [employer], no dietary restrictions, exercises regularly   Other Topics Concern  . Not on file   Social History Narrative   Patient works full time for [employer]. [employer]. Patient lives at home with his wife Ronald Griffin).   Education college.   Right handed.   Caffeine 4-6 cups daily.     No current outpatient prescriptions on file prior to visit.   No  current facility-administered medications on file prior to visit.    No Known Allergies  Review of Systems  Review of Systems  Constitutional: Negative for fever, chills and malaise/fatigue.  HENT: Negative for congestion, hearing loss and nosebleeds.   Eyes: Negative for discharge.  Respiratory: Negative for cough, sputum production, shortness of breath and wheezing.   Cardiovascular: Negative for chest pain, palpitations and leg swelling.  Gastrointestinal: Negative for heartburn, nausea, vomiting, abdominal pain, diarrhea, constipation and blood in stool.  Genitourinary: Negative for dysuria, urgency, frequency and hematuria.  Musculoskeletal: Positive for joint pain. Negative for myalgias, back pain and falls.  Skin: Negative for rash.  Neurological: Negative for dizziness, tremors, sensory change, focal weakness,  loss of consciousness, weakness and headaches.  Endo/Heme/Allergies: Negative for polydipsia. Does not bruise/bleed easily.  Psychiatric/Behavioral: Negative for depression and suicidal ideas. The patient is not nervous/anxious and does not have insomnia.     Objective  BP 110/76 mmHg  Pulse 60  Temp(Src) 98 F (36.7 C) (Oral)  Resp 18  Ht 5\' 11"  (1.803 m)  Wt 222 lb (100.699 kg)  BMI 30.98 kg/m2  SpO2 97%  Physical Exam  Physical Exam  Constitutional: He is oriented to person, place, and time and well-developed, well-nourished, and in no distress. No distress.  HENT:  Head: Normocephalic and atraumatic.  Eyes: Conjunctivae are normal.  Neck: Neck supple. No thyromegaly present.  Cardiovascular: Normal rate, regular rhythm and normal heart sounds.   No murmur heard. Pulmonary/Chest: Effort normal and breath sounds normal. No respiratory distress.  Abdominal: He exhibits no distension and no mass. There is no tenderness.  Musculoskeletal: He exhibits no edema.  Neurological: He is alert and oriented to person, place, and time.  Skin: Skin is warm.  Psychiatric: Memory, affect and judgment normal.    Lab Results  Component Value Date   TSH 3.390 03/29/2014   Lab Results  Component Value Date   WBC 5.9 03/15/2014   HGB 14.9 03/15/2014   HCT 43.5 03/15/2014   MCV 91.4 03/15/2014   PLT 215 03/15/2014   Lab Results  Component Value Date   CREATININE 1.00 03/15/2014   BUN 14 03/15/2014   NA 142 03/15/2014   K 3.9 03/15/2014   CL 102 03/15/2014   CO2 27 03/15/2014   Lab Results  Component Value Date   ALT 18 03/15/2014   AST 21 03/15/2014   ALKPHOS 80 03/15/2014   BILITOT 0.8 03/15/2014     Assessment & Plan  History of colonic polyps Referred to gastroenterology to for surveillance  Headache Was having daily headaches in December and then had an episode of blurry vision which prompted a work up which was negative. Was persistent til last month. With 10  pound weight loss, massage therapy and chiropractic now headaches are better. Encouraged moist heat and gentle stretching as tolerated. May try NSAIDs and prescription meds as directed and report if symptoms worsen or seek immediate care, report if symptoms return  Erectile dysfunction Has used Cialis 5 mg daily prn, will refer to urology   Motion sickness Going on a cruise, given Scopolamine patches to use prn  Knee pain, right Encouraged Curcumen, Krill oil caps and vitamin D daily, report worsening symptoms  Hyperlipidemia Encouraged heart healthy diet, increase exercise, avoid trans fats, consider a krill oil cap daily, check lipids  Preventative health care Patient encouraged to maintain heart healthy diet, regular exercise, adequate sleep. Consider daily probiotics. Take medications as prescribed. Given and reviewed copy of  ACP documents from [place] and encouraged to complete and return. Annual labs ordered today

## 2014-11-21 NOTE — Assessment & Plan Note (Signed)
Has used Cialis 5 mg daily prn, will refer to urology

## 2014-11-21 NOTE — Assessment & Plan Note (Signed)
Patient encouraged to maintain heart healthy diet, regular exercise, adequate sleep. Consider daily probiotics. Take medications as prescribed. Given and reviewed copy of ACP documents from [employer] and encouraged to complete and return. Annual labs ordered today

## 2014-12-17 ENCOUNTER — Encounter: Payer: Self-pay | Admitting: Family Medicine

## 2015-04-03 ENCOUNTER — Ambulatory Visit (INDEPENDENT_AMBULATORY_CARE_PROVIDER_SITE_OTHER): Payer: 59 | Admitting: Physician Assistant

## 2015-04-03 ENCOUNTER — Encounter: Payer: Self-pay | Admitting: Physician Assistant

## 2015-04-03 ENCOUNTER — Ambulatory Visit (HOSPITAL_BASED_OUTPATIENT_CLINIC_OR_DEPARTMENT_OTHER)
Admission: RE | Admit: 2015-04-03 | Discharge: 2015-04-03 | Disposition: A | Discharge status: Home / Self Care | Payer: 59 | Source: Ambulatory Visit | Attending: Physician Assistant | Admitting: Physician Assistant

## 2015-04-03 VITALS — BP 118/80 | HR 60 | Temp 98.1°F | Ht 71.0 in | Wt 234.0 lb

## 2015-04-03 DIAGNOSIS — M25511 Pain in right shoulder: Secondary | ICD-10-CM

## 2015-04-03 DIAGNOSIS — G8929 Other chronic pain: Secondary | ICD-10-CM | POA: Insufficient documentation

## 2015-04-03 DIAGNOSIS — M549 Dorsalgia, unspecified: Secondary | ICD-10-CM | POA: Insufficient documentation

## 2015-04-03 DIAGNOSIS — M545 Low back pain, unspecified: Secondary | ICD-10-CM

## 2015-04-03 DIAGNOSIS — M19011 Primary osteoarthritis, right shoulder: Secondary | ICD-10-CM | POA: Insufficient documentation

## 2015-04-03 HISTORY — DX: Low back pain, unspecified: M54.50

## 2015-04-03 MED ORDER — CYCLOBENZAPRINE HCL 10 MG PO TABS: 10.0000 mg | ORAL_TABLET | Freq: Three times a day (TID) | ORAL | Status: DC | PRN

## 2015-04-03 MED ORDER — MELOXICAM 15 MG PO TABS: 15.0000 mg | ORAL_TABLET | Freq: Every day | ORAL | Status: DC

## 2015-04-03 NOTE — Assessment & Plan Note (Signed)
Rx Mobic daily with food. ES tylenol for breakthrough pain. Flexeril as directed. Rest. No heavy lifting. Alternate ice/heat. Topical Aspercreme as directed. Follow-up if not resolving.

## 2015-04-03 NOTE — Patient Instructions (Signed)
Please go downstairs for x-ray. I will call you with your results.  You will be contacted by Sports Medicine for assessment.   Please take the Mobic daily with food. Use flexeril as directed. No heavy lifting or overexertion. Alternate ice and heat to the area. Topical Aspercreme may also be beneficial.

## 2015-04-03 NOTE — Assessment & Plan Note (Signed)
Will obtain x-ray to assess. Supportive measures reviewed. Referral placed to Sports Medicine for assessment.

## 2015-04-03 NOTE — Progress Notes (Signed)
Pre visit review using our clinic review tool, if applicable. No additional management support is needed unless otherwise documented below in the visit note. 

## 2015-04-03 NOTE — Progress Notes (Addendum)
Patient presents to clinic today c/o right sided low back pain since Saturday occurring after an episode of heavy lifting at work. Tried to use his inversion table without improvement in symptoms. Describes pain as sharp and radiating to upper leg. Has tried OTC medications without relief of symptoms. Denies numbness, tingling of extremities. Denies saddle anesthesia. Denies change to bowel habits.  Patient also c/o chronic R shoulder pain x 3+ years described as pain with ROM, specially overhead and flexion of shoulder. Has been getting progressively worse over the past several months. Has history of neck injury but denies shoulder injury. Has noted weakness with pain but ROM is intact. Has never had imaging of the shoulder.   Past Medical History  Diagnosis Date  . Dizziness   . Frequent headaches   . Preventative health care 11/21/2014  . History of colonic polyps 11/21/2014  . Headache 03/29/2014  . Hyperlipidemia   . Erectile dysfunction 11/21/2014  . Motion sickness 11/21/2014  . Knee pain, right 11/21/2014  . Elevated antinuclear antibody (ANA) level 11/21/2014    No current outpatient prescriptions on file prior to visit.   No current facility-administered medications on file prior to visit.    No Known Allergies  Family History  Problem Relation Age of Onset  . Lung cancer Mother   . Breast cancer Mother   . Cancer Mother     breast age 73s, colon late 26s, lung, liver, skin  . Bipolar disorder Father   . Cancer Father 110    colon cancer  . Dementia Father   . Cancer Maternal Grandmother     ?  Marland Kitchen Heart disease Maternal Grandfather     MI, sudden  . Stroke Paternal Grandmother 3  . Bipolar disorder Paternal Grandfather   . Dementia Paternal Grandfather   . Cancer Paternal Grandfather     colon  . Obesity Brother     Social History   Social History  . Marital Status: Married    Spouse Name: [name]  . Number of Children: 2  . Years of Education: college    Social History Main Topics  . Smoking status: Never Smoker   . Smokeless tobacco: Never Used  . Alcohol Use: 1.2 oz/week    2 Cans of beer per week     Comment: weekly  . Drug Use: No  . Sexual Activity: Yes     Comment: lives with wife, works in [employer], part time at [employer], no dietary restrictions, exercises regularly   Other Topics Concern  . None   Social History Narrative   Patient works full time for [employer]. Patient lives at home with his wife [name]).   Education college.   Right handed.   Caffeine 4-6 cups daily.    Review of Systems - See HPI.  All other ROS are negative.  BP 118/80 mmHg  Pulse 60  Temp(Src) 98.1 F (36.7 C) (Oral)  Ht 5\' 11"  (1.803 m)  Wt 234 lb (106.142 kg)  BMI 32.65 kg/m2  SpO2 97%  Physical Exam  Constitutional: He is oriented to person, place, and time and well-developed, well-nourished, and in no distress.  HENT:  Head: Normocephalic and atraumatic.  Eyes: Conjunctivae are normal.  Neck: Neck supple.  Cardiovascular: Normal rate, regular rhythm, normal heart sounds and intact distal pulses.   Pulmonary/Chest: Effort normal and breath sounds normal. No respiratory distress. He has no wheezes. He has no rales. He exhibits no tenderness.  Musculoskeletal:  Right shoulder: Normal.       Lumbar back: He exhibits tenderness, pain and spasm. He exhibits no bony tenderness.  Neurological: He is alert and oriented to person, place, and time.  Skin: Skin is warm and dry. No rash noted.  Psychiatric: Affect normal.  Vitals reviewed.  No results found for this or any previous visit (from the past 2160 hour(s)).  Assessment/Plan: Chronic right shoulder pain Will obtain x-ray to assess. Supportive measures reviewed. Referral placed to Sports Medicine for assessment.  Right-sided low back pain without sciatica Rx Mobic daily with food. ES tylenol for breakthrough pain. Flexeril as directed. Rest. No  heavy lifting. Alternate ice/heat. Topical Aspercreme as directed. Follow-up if not resolving.     Marland Kitchen

## 2015-05-04 ENCOUNTER — Telehealth: Payer: Self-pay | Admitting: Family Medicine

## 2015-05-04 NOTE — Telephone Encounter (Signed)
Called patient.  He says that he hurt himself at work lifting relatively heavy boxes.  States he was lifting throughout the day.  Back was getting tight.  Was totally painful by the end of the day.  By the weekend, he was bedridden.  States pain was in his lower back and radiated down his right leg.  Described as sciatic nerve pain.  States he's better now, but has occasional flare ups.

## 2015-05-04 NOTE — Telephone Encounter (Signed)
Relation to pt: self Call back number: [phone] and fax [phone] and Policy # [id number]   Reason for call:  Patient requesting office notes from 04/03/2015 appointment for his lower back pain. Patient brought form in at the time of appointment and PA filled out and checked box stating "No injury was caused by accident" as per patient this is false therefore requesting notes stating "injury was caused by accident" and please fax.

## 2015-05-04 NOTE — Telephone Encounter (Signed)
When seen in office we discussed back pain after heavy lifting at work -- I apologize if the wrong box was marked. I can take care of that for him tomorrow by writing a letter and providing his office notes. Since I will not be changing forms but writing a letter if you could verify with him exactly what he was doing at work when he injured his back giving more details that will be helpful. Should have this completed by end-of-day tomorrow.

## 2015-05-04 NOTE — Telephone Encounter (Signed)
Left a message for call back.  

## 2015-05-05 ENCOUNTER — Encounter: Payer: Self-pay | Admitting: Physician Assistant

## 2015-05-05 NOTE — Addendum Note (Signed)
Addended by: Raiford Noble on: 05/05/2015 12:29 PM   Modules accepted: Miquel Dunn

## 2015-05-05 NOTE — Telephone Encounter (Signed)
Per Ashlee ok to fax per pt request - faxed to [phone] and pt at machine waiting to receive.

## 2015-05-05 NOTE — Telephone Encounter (Signed)
Note has been addended and letter written. It has been placed at front desk in a folder with his name on it. Please inform him and have him let us know if anything else is needed.

## 2015-05-05 NOTE — Telephone Encounter (Signed)
Left a message for call back.  

## 2015-10-04 ENCOUNTER — Telehealth: Payer: Self-pay | Admitting: Family Medicine

## 2015-10-04 NOTE — Telephone Encounter (Signed)
Relation to [id number] Call back number:[phone]   Reason for call:  Patient requesting a referral to [facility]  due to tore bicep while at work. Please advise

## 2015-10-04 NOTE — Telephone Encounter (Signed)
Called patient to set up appointment with provider. States he has already scheduled appointment with Orthopedics. Advised I would let Dr. Charlett Blake know.

## 2016-12-23 ENCOUNTER — Ambulatory Visit (INDEPENDENT_AMBULATORY_CARE_PROVIDER_SITE_OTHER): Payer: 59 | Admitting: Medical

## 2016-12-23 ENCOUNTER — Encounter: Payer: Self-pay | Admitting: Medical

## 2016-12-23 VITALS — BP 122/72 | HR 66 | Temp 98.2°F | Resp 16 | Ht 71.0 in | Wt 235.8 lb

## 2016-12-23 DIAGNOSIS — M546 Pain in thoracic spine: Secondary | ICD-10-CM | POA: Diagnosis not present

## 2016-12-23 DIAGNOSIS — R252 Cramp and spasm: Secondary | ICD-10-CM

## 2016-12-23 MED ORDER — DICLOFENAC SODIUM 75 MG PO TBEC: 75.0000 mg | DELAYED_RELEASE_TABLET | Freq: Two times a day (BID) | ORAL | 0 refills | Status: DC

## 2016-12-23 NOTE — Progress Notes (Signed)
Subjective:    Patient ID: Ronald Griffin, male    DOB: [birth year], 51 y.o.   MRN: [id number]  HPI  Pt in for recent back pain.   Pt states he was lifting some weights at home and hurt his back. He did this on Saturday evening. Pt had to take 2 days off work. 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 He states he has some training tomorrow and he feels ready to do that training.   He expresses that he had pain in the same area intermittently. He feels that he knows when he is ready to work.   Review of Systems  Constitutional: Negative for chills, fatigue and fever.  Respiratory: Negative for cough, chest tightness, shortness of breath and wheezing.   Cardiovascular: Negative for chest pain and palpitations.  Musculoskeletal: Positive for back pain. Negative for arthralgias, joint swelling, myalgias and neck stiffness.       Rt thumb- mild twitch at base of the thumb.  At the end of the interview he added question about his intermittent twitching of right thumb. He notes it only occurs when he holds things for extended period of time. About 45 seconds to a minute.  Skin: Negative for rash.  Neurological: Negative for dizziness, syncope, speech difficulty, numbness and headaches.  Hematological: Negative for adenopathy. Does not bruise/bleed easily.  Psychiatric/Behavioral: Negative for agitation, confusion, dysphoric mood, self-injury and suicidal ideas. The patient is not nervous/anxious.     Past Medical History:  Diagnosis Date  . Dizziness   . Elevated antinuclear antibody (ANA) level 11/21/2014  . Erectile dysfunction 11/21/2014  . Frequent headaches   . Headache 03/29/2014  . History of colonic polyps 11/21/2014  . Hyperlipidemia   . Knee pain, right 11/21/2014  . Motion sickness 11/21/2014  . Preventative health care 11/21/2014     Social History   Social History  . Marital status: Married    Spouse name: [name]  . Number of  children: 2  . Years of education: college   Occupational History  . Not on file.   Social History Main Topics  . Smoking status: Never Smoker  . Smokeless tobacco: Never Used  . Alcohol use 1.2 oz/week    2 Cans of beer per week     Comment: weekly  . Drug use: No  . Sexual activity: Yes     Comment: lives with wife, works in [employer], part time at [employer], no dietary restrictions, exercises regularly   Other Topics Concern  . Not on file   Social History Narrative   Patient works full time for [employer]. [employer]. Patient lives at home with his wife [name]).   Education college.   Right handed.   Caffeine 4-6 cups daily.     Past Surgical History:  Procedure Laterality Date  . SKIN BIOPSY    . VARICOCELE EXCISION    . VASECTOMY      Family History  Problem Relation Age of Onset  . Lung cancer Mother   . Breast cancer Mother   . Cancer Mother        breast age 59s, colon late 42s, lung, liver, skin  . Bipolar disorder Father   . Cancer Father 24       colon cancer  . Dementia Father   . Cancer Maternal Grandmother        ?  Marland Kitchen Heart disease Maternal Grandfather  MI, sudden  . Stroke Paternal Grandmother 80  . Bipolar disorder Paternal Grandfather   . Dementia Paternal Grandfather   . Cancer Paternal Grandfather        colon  . Obesity Brother     No Known Allergies  Current Outpatient Prescriptions on File Prior to Visit  Medication Sig Dispense Refill  . cyclobenzaprine (FLEXERIL) 10 MG tablet Take 1 tablet (10 mg total) by mouth 3 (three) times daily as needed for muscle spasms. 30 tablet 0  . meloxicam (MOBIC) 15 MG tablet Take 1 tablet (15 mg total) by mouth daily. 30 tablet 0   No current facility-administered medications on file prior to visit.     BP 122/72   Pulse 66   Temp 98.2 F (36.8 C) (Oral)   Resp 16   Ht 5\' 11"  (1.803 m)   Wt 235 lb 12.8 oz (107 kg)   SpO2 97%   BMI 32.89 kg/m       Objective:    Physical Exam  General Mental Status- Alert. General Appearance- Not in acute distress.   Skin General: Color- Normal Color. Moisture- Normal Moisture.  Neck Carotid Arteries- Normal color. Moisture- Normal Moisture. No carotid bruits. No JVD.  Chest and Lung Exam Auscultation: Breath Sounds:-Normal.  Cardiovascular Auscultation:Rythm- Regular. Murmurs & Other Heart Sounds:Auscultation of the heart reveals- No Murmurs.  Abdomen Inspection:-Inspeection Normal. Palpation/Percussion:Note:No mass. Palpation and Percussion of the abdomen reveal- Non Tender, Non Distended + BS, no rebound or guarding.  Neurologic Cranial Nerve exam:- CN III-XII intact(No nystagmus), symmetric smile. Strength:- 5/5 equal and symmetric strength both upper and lower extremities.  Back- no mid t-spine tenderness to palpation. Some paraspinal tenderness left of mid spine.  Right hand on holding his cell phone between the thumb and index finger he demonstrates that the right thenar eminence started to twitch.     Assessment & Plan:  For your back pain left side thorax will rx diclofenac nsaid. Can continue work tomorrow but if pain re- flares let us know. Will fill out your insurance form.   For rt thumb/thenar imminence cramping and twitching will get cmp and magnesium level. Will follow these labs and notify you.  If other area have thumb twitching  Worsening or new areas then refer to neurologist.  Will write return to work note.  Follow up 7-10 days or as needed.  Brax Walen, Percell Miller, PA-C

## 2016-12-23 NOTE — Patient Instructions (Addendum)
For your back pain left side thorax will rx diclofenac nsaid. Can continue work tomorrow but if pain re- flares let us know. Will fill out your insurance form.   For rt thumb/thenar imminence cramping and twitching will get cmp and magnesium level. Will follow these labs and notify you.   Will write return to work note.  Follow up 7-10 days or as needed.

## 2016-12-24 ENCOUNTER — Telehealth: Payer: Self-pay | Admitting: Medical

## 2016-12-24 LAB — MAGNESIUM: Magnesium: 2.1 mg/dL (ref 1.5–2.5)

## 2016-12-24 LAB — COMPLETE METABOLIC PANEL WITH GFR
ALT: 20 U/L (ref 9–46)
AST: 18 U/L (ref 10–35)
Albumin: 4.7 g/dL (ref 3.6–5.1)
Alkaline Phosphatase: 74 U/L (ref 40–115)
BUN: 22 mg/dL (ref 7–25)
CHLORIDE: 106 mmol/L (ref 98–110)
CO2: 23 mmol/L (ref 20–32)
Calcium: 9.7 mg/dL (ref 8.6–10.3)
Creat: 0.93 mg/dL (ref 0.70–1.33)
GFR, Est African American: >89 mL/min (ref >=60)
GFR, Est Non African American: >89 mL/min (ref >=60)
GLUCOSE: 91 mg/dL (ref 65–99)
POTASSIUM: 5.2 mmol/L (ref 3.5–5.3)
SODIUM: 142 mmol/L (ref 135–146)
Total Bilirubin: 0.6 mg/dL (ref 0.2–1.2)
Total Protein: 7.1 g/dL (ref 6.1–8.1)

## 2016-12-24 NOTE — Telephone Encounter (Signed)
I believe you sent me result note on this patient but I accidentally clicked off of it before answering the message.  I can't find a message in the chart? Will you send it again.

## 2016-12-25 ENCOUNTER — Telehealth: Payer: Self-pay | Admitting: Medical

## 2016-12-25 NOTE — Telephone Encounter (Signed)
Pt can pick up his form but for section that states charges he needs to call billing. Number given on sheet.

## 2016-12-25 NOTE — Telephone Encounter (Signed)
Left pt a message letting him know form is ready for pick up at front desk.

## 2016-12-31 ENCOUNTER — Encounter (HOSPITAL_BASED_OUTPATIENT_CLINIC_OR_DEPARTMENT_OTHER): Payer: Self-pay

## 2016-12-31 ENCOUNTER — Emergency Department (HOSPITAL_BASED_OUTPATIENT_CLINIC_OR_DEPARTMENT_OTHER): Payer: Worker's Compensation

## 2016-12-31 DIAGNOSIS — Y9389 Activity, other specified: Secondary | ICD-10-CM | POA: Insufficient documentation

## 2016-12-31 DIAGNOSIS — X58XXXA Exposure to other specified factors, initial encounter: Secondary | ICD-10-CM | POA: Insufficient documentation

## 2016-12-31 DIAGNOSIS — Y999 Unspecified external cause status: Secondary | ICD-10-CM | POA: Diagnosis not present

## 2016-12-31 DIAGNOSIS — S20212A Contusion of left front wall of thorax, initial encounter: Secondary | ICD-10-CM | POA: Insufficient documentation

## 2016-12-31 DIAGNOSIS — Z79899 Other long term (current) drug therapy: Secondary | ICD-10-CM | POA: Diagnosis not present

## 2016-12-31 DIAGNOSIS — Y9289 Other specified places as the place of occurrence of the external cause: Secondary | ICD-10-CM | POA: Diagnosis not present

## 2016-12-31 DIAGNOSIS — S299XXA Unspecified injury of thorax, initial encounter: Secondary | ICD-10-CM | POA: Diagnosis present

## 2016-12-31 NOTE — ED Triage Notes (Signed)
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

## 2017-01-01 ENCOUNTER — Emergency Department (HOSPITAL_BASED_OUTPATIENT_CLINIC_OR_DEPARTMENT_OTHER)
Admission: EM | Admit: 2017-01-01 | Discharge: 2017-01-01 | Disposition: A | Discharge status: Home / Self Care | Payer: Worker's Compensation | Attending: Emergency Medicine | Admitting: Emergency Medicine

## 2017-01-01 DIAGNOSIS — S20212A Contusion of left front wall of thorax, initial encounter: Secondary | ICD-10-CM

## 2017-01-01 NOTE — Discharge Instructions (Signed)
X-ray was negative for any rib fractures.  You can take 800 mg of ibuprofen every 8 hours with food as needed for pain control. You can use a pillow under your left arm when urine at home to help control your pain.   Please make sure to take deep breaths several times throughout the day to decrease your risk for pneumonia.   If you have any new or worsening symptoms, including another injury, or shortness of breath, please return to the emergency department for reevaluation.
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
## 2017-02-07 ENCOUNTER — Telehealth: Payer: Self-pay | Admitting: Family Medicine

## 2017-02-07 NOTE — Telephone Encounter (Signed)
Would you like patient to come in for a OV to discuss new Cialis/Viagra that is suppose to be cheaper or do you know which one it is?   Please advise

## 2017-02-07 NOTE — Telephone Encounter (Signed)
°  Relation to WH:QPRF Call back [phone] Pharmacy: [facility] - [place], [place] - [address] [phone] (Phone) [phone] (Fax)     Reason for call:  Patient states theres a new cialis / viagra and it suppose to be much cheaper,patient would like discuss the new medication and would like PCP to send Rx, please advise

## 2017-02-08 NOTE — Telephone Encounter (Signed)
He can likely have it but I have not seen him in 2 years so would need to check labs and vitals then prescribe to be the safest. Then we can discuss side effects and prescribe

## 2017-02-10 NOTE — Telephone Encounter (Signed)
lvm advising patient of message below °

## 2017-02-14 ENCOUNTER — Ambulatory Visit (INDEPENDENT_AMBULATORY_CARE_PROVIDER_SITE_OTHER): Payer: 59 | Admitting: Family Medicine

## 2017-02-14 DIAGNOSIS — E669 Obesity, unspecified: Secondary | ICD-10-CM

## 2017-02-14 DIAGNOSIS — N529 Male erectile dysfunction, unspecified: Secondary | ICD-10-CM | POA: Diagnosis not present

## 2017-02-14 DIAGNOSIS — E785 Hyperlipidemia, unspecified: Secondary | ICD-10-CM

## 2017-02-14 DIAGNOSIS — M79641 Pain in right hand: Secondary | ICD-10-CM

## 2017-02-14 DIAGNOSIS — Z1211 Encounter for screening for malignant neoplasm of colon: Secondary | ICD-10-CM | POA: Diagnosis not present

## 2017-02-14 LAB — LIPID PANEL
Cholesterol: 234 mg/dL — ABNORMAL HIGH (ref 0–200)
HDL: 53.3 mg/dL (ref >39.00)
NONHDL: 180.57
Total CHOL/HDL Ratio: 4
Triglycerides: 201 mg/dL — ABNORMAL HIGH (ref 0.0–149.0)
VLDL: 40.2 mg/dL — ABNORMAL HIGH (ref 0.0–40.0)

## 2017-02-14 LAB — COMPREHENSIVE METABOLIC PANEL
ALK PHOS: 67 U/L (ref 39–117)
ALT: 21 U/L (ref 0–53)
AST: 21 U/L (ref 0–37)
Albumin: 4.5 g/dL (ref 3.5–5.2)
BILIRUBIN TOTAL: 1 mg/dL (ref 0.2–1.2)
BUN: 15 mg/dL (ref 6–23)
CO2: 29 mEq/L (ref 19–32)
Calcium: 9.6 mg/dL (ref 8.4–10.5)
Chloride: 101 mEq/L (ref 96–112)
Creatinine, Ser: 0.9 mg/dL (ref 0.40–1.50)
GFR: 93.79 mL/min (ref >60.00)
GLUCOSE: 99 mg/dL (ref 70–99)
Potassium: 4.5 mEq/L (ref 3.5–5.1)
Sodium: 138 mEq/L (ref 135–145)
TOTAL PROTEIN: 7.2 g/dL (ref 6.0–8.3)

## 2017-02-14 LAB — CBC
HCT: 44.1 % (ref 39.0–52.0)
Hemoglobin: 14.7 g/dL (ref 13.0–17.0)
MCHC: 33.4 g/dL (ref 30.0–36.0)
MCV: 93.8 fl (ref 78.0–100.0)
Platelets: 217 10*3/uL (ref 150.0–400.0)
RBC: 4.7 Mil/uL (ref 4.22–5.81)
RDW: 13.4 % (ref 11.5–15.5)
WBC: 5.5 10*3/uL (ref 4.0–10.5)

## 2017-02-14 LAB — TSH: TSH: 4.45 u[IU]/mL (ref 0.35–4.50)

## 2017-02-14 LAB — LDL CHOLESTEROL, DIRECT: Direct LDL: 140 mg/dL

## 2017-02-14 MED ORDER — SILDENAFIL CITRATE 20 MG PO TABS: 20.0000 mg | ORAL_TABLET | Freq: Three times a day (TID) | ORAL | 1 refills | Status: DC

## 2017-02-14 NOTE — Progress Notes (Signed)
Subjective:  I acted as a Education administrator for Dr. Charlett Blake. [name], [place]  Patient ID: Ronald Griffin, male    DOB: [birth year], 51 y.o.   MRN: [id number]  No chief complaint on file.   HPI  Patient is in today for an acute visit to restart his care. He feels well today. No recent febrile illness or hospitalizations. He is requesting a prescription for Revatio for his ED. He did not pick it up previously due to cost. He is noting some pain and numbness into right thumb recently to obvious injury. Denies CP/palp/SOB/HA/congestion/fevers/GI or GU c/o. Taking meds as prescribed  Patient Care Team: Mosie Lukes, MD as PCP - General (Family Medicine)   Past Medical History:  Diagnosis Date  . Dizziness   . Elevated antinuclear antibody (ANA) level 11/21/2014  . Erectile dysfunction 11/21/2014  . Frequent headaches   . Headache 03/29/2014  . History of colonic polyps 11/21/2014  . Hyperlipidemia   . Knee pain, right 11/21/2014  . Motion sickness 11/21/2014  . Obesity 02/16/2017  . Preventative health care 11/21/2014    Past Surgical History:  Procedure Laterality Date  . SKIN BIOPSY    . VARICOCELE EXCISION    . VASECTOMY      Family History  Problem Relation Age of Onset  . Lung cancer Mother   . Breast cancer Mother   . Cancer Mother        breast age 28s, colon late 32s, lung, liver, skin  . Bipolar disorder Father   . Cancer Father 33       colon cancer  . Dementia Father   . Cancer Maternal Grandmother        ?  Marland Kitchen Heart disease Maternal Grandfather        MI, sudden  . Stroke Paternal Grandmother 90  . Bipolar disorder Paternal Grandfather   . Dementia Paternal Grandfather   . Cancer Paternal Grandfather        colon  . Obesity Brother     Social History   Social History  . Marital status: Married    Spouse name: [name]  . Number of children: 2  . Years of education: college   Occupational History  . Not on file.   Social History Main Topics  . Smoking status:  Never Smoker  . Smokeless tobacco: Never Used  . Alcohol use 1.2 oz/week    2 Cans of beer per week     Comment: weekly  . Drug use: No  . Sexual activity: Yes     Comment: lives with wife, works in [employer], part time at [employer], no dietary restrictions, exercises regularly   Other Topics Concern  . Not on file   Social History Narrative   Patient works full time for [employer]. Patient lives at home with his wife [name]).   Education college.   Right handed.   Caffeine 4-6 cups daily.     Outpatient Medications Prior to Visit  Medication Sig Dispense Refill  . diclofenac (VOLTAREN) 75 MG EC tablet Take 1 tablet (75 mg total) by mouth 2 (two) times daily. 20 tablet 0  . cyclobenzaprine (FLEXERIL) 10 MG tablet Take 1 tablet (10 mg total) by mouth 3 (three) times daily as needed for muscle spasms. 30 tablet 0   No facility-administered medications prior to visit.     No Known Allergies  Review of Systems  Constitutional: Negative for fever and malaise/fatigue.  HENT: Negative for congestion.  Eyes: Negative for blurred vision.  Respiratory: Negative for cough and shortness of breath.   Cardiovascular: Negative for chest pain, palpitations and leg swelling.  Gastrointestinal: Negative for vomiting.  Musculoskeletal: Positive for joint pain. Negative for back pain.  Skin: Negative for rash.  Neurological: Negative for loss of consciousness and headaches.       Objective:    Physical Exam  Constitutional: He is oriented to person, place, and time. He appears well-developed and well-nourished. No distress.  HENT:  Head: Normocephalic and atraumatic.  Eyes: Conjunctivae are normal.  Neck: Normal range of motion. No thyromegaly present.  Cardiovascular: Normal rate and regular rhythm.   Pulmonary/Chest: Effort normal and breath sounds normal. He has no wheezes.  Abdominal: Soft. Bowel sounds are normal. There is no tenderness.    Musculoskeletal: Normal range of motion. He exhibits no edema or deformity.  Neurological: He is alert and oriented to person, place, and time.  Skin: Skin is warm and dry. He is not diaphoretic.  Psychiatric: He has a normal mood and affect.    There were no vitals taken for this visit. Wt Readings from Last 3 Encounters:  12/31/16 239 lb 6.7 oz (108.6 kg)  12/23/16 235 lb 12.8 oz (107 kg)  04/03/15 234 lb (106.1 kg)   BP Readings from Last 3 Encounters:  12/31/16 130/71  12/23/16 122/72  04/03/15 118/80     Immunization History  Administered Date(s) Administered  . Tdap 04/29/2010    Health Maintenance  Topic Date Due  . Hepatitis C Screening  May 17, 1965  . HIV Screening  01/19/1979  . COLONOSCOPY  04/29/2014  . INFLUENZA VACCINE  07/27/2017 (Originally 11/27/2016)  . TETANUS/TDAP  04/29/2020    Lab Results  Component Value Date   WBC 5.5 02/14/2017   HGB 14.7 02/14/2017   HCT 44.1 02/14/2017   PLT 217.0 02/14/2017   GLUCOSE 99 02/14/2017   CHOL 234 (H) 02/14/2017   TRIG 201.0 (H) 02/14/2017   HDL 53.30 02/14/2017   LDLDIRECT 140.0 02/14/2017   LDLCALC 108 (H) 11/21/2014   ALT 21 02/14/2017   AST 21 02/14/2017   NA 138 02/14/2017   K 4.5 02/14/2017   CL 101 02/14/2017   CREATININE 0.90 02/14/2017   BUN 15 02/14/2017   CO2 29 02/14/2017   TSH 4.45 02/14/2017   PSA 0.50 11/21/2014    Lab Results  Component Value Date   TSH 4.45 02/14/2017   Lab Results  Component Value Date   WBC 5.5 02/14/2017   HGB 14.7 02/14/2017   HCT 44.1 02/14/2017   MCV 93.8 02/14/2017   PLT 217.0 02/14/2017   Lab Results  Component Value Date   NA 138 02/14/2017   K 4.5 02/14/2017   CO2 29 02/14/2017   GLUCOSE 99 02/14/2017   BUN 15 02/14/2017   CREATININE 0.90 02/14/2017   BILITOT 1.0 02/14/2017   ALKPHOS 67 02/14/2017   AST 21 02/14/2017   ALT 21 02/14/2017   PROT 7.2 02/14/2017   ALBUMIN 4.5 02/14/2017   CALCIUM 9.6 02/14/2017   ANIONGAP 13 03/15/2014   GFR  93.79 02/14/2017   Lab Results  Component Value Date   CHOL 234 (H) 02/14/2017   Lab Results  Component Value Date   HDL 53.30 02/14/2017   Lab Results  Component Value Date   LDLCALC 108 (H) 11/21/2014   Lab Results  Component Value Date   TRIG 201.0 (H) 02/14/2017   Lab Results  Component Value Date   CHOLHDL  4 02/14/2017   No results found for: HGBA1C       Assessment & Plan:   Problem List Items Addressed This Visit    Hyperlipidemia - Primary    Encouraged heart healthy diet, increase exercise, avoid trans fats, consider a krill oil cap daily. Encouraged to start statin      Relevant Medications   sildenafil (REVATIO) 20 MG tablet   Other Relevant Orders   CBC (Completed)   Comprehensive metabolic panel (Completed)   Lipid panel (Completed)   Erectile dysfunction    Given rx for Revatio      Relevant Orders   CBC (Completed)   Comprehensive metabolic panel (Completed)   TSH (Completed)   Colon cancer screening    Patient referred to gastroenterology for colonoscopy      Relevant Orders   Ambulatory referral to Gastroenterology   Obesity    Encouraged DASH diet, decrease po intake and increase exercise as tolerated. Needs 7-8 hours of sleep nightly. Avoid trans fats, eat small, frequent meals every 4-5 hours with lean proteins, complex carbs and healthy fats. Minimize simple carbs         I have discontinued Mr. Lederman's cyclobenzaprine. I am also having him start on sildenafil. Additionally, I am having him maintain his diclofenac.  Meds ordered this encounter  Medications  . sildenafil (REVATIO) 20 MG tablet    Sig: Take 1-4 tablets (20-80 mg total) by mouth 3 (three) times daily.    Dispense:  35 tablet    Refill:  1    CMA served as scribe during this visit. History, Physical and Plan performed by medical provider. Documentation and orders reviewed and attested to.  Penni Homans, MD

## 2017-02-14 NOTE — Patient Instructions (Addendum)
Encouraged increased rest and hydration, add probiotics, zinc such as Coldeze or Xicam. Treat fevers as needed. Mucinex twice daily, vitamin C 500 to 1000 mg, Elderberry liquid, aged or black garlic Upper Respiratory Infection, Adult Most upper respiratory infections (URIs) are caused by a virus. A URI affects the nose, throat, and upper air passages. The most common type of URI is often called "the common cold." Follow these instructions at home:  Take medicines only as told by your doctor.  Gargle warm saltwater or take cough drops to comfort your throat as told by your doctor.  Use a warm mist humidifier or inhale steam from a shower to increase air moisture. This may make it easier to breathe.  Drink enough fluid to keep your pee (urine) clear or pale yellow.  Eat soups and other clear broths.  Have a healthy diet.  Rest as needed.  Go back to work when your fever is gone or your doctor says it is okay. ? You may need to stay home longer to avoid giving your URI to others. ? You can also wear a face mask and wash your hands often to prevent spread of the virus.  Use your inhaler more if you have asthma.  Do not use any tobacco products, including cigarettes, chewing tobacco, or electronic cigarettes. If you need help quitting, ask your doctor. Contact a doctor if:  You are getting worse, not better.  Your symptoms are not helped by medicine.  You have chills.  You are getting more short of breath.  You have brown or red mucus.  You have yellow or brown discharge from your nose.  You have pain in your face, especially when you bend forward.  You have a fever.  You have puffy (swollen) neck glands.  You have pain while swallowing.  You have white areas in the back of your throat. Get help right away if:  You have very bad or constant: ? Headache. ? Ear pain. ? Pain in your forehead, behind your eyes, and over your cheekbones (sinus pain). ? Chest pain.  You  have long-lasting (chronic) lung disease and any of the following: ? Wheezing. ? Long-lasting cough. ? Coughing up blood. ? A change in your usual mucus.  You have a stiff neck.  You have changes in your: ? Vision. ? Hearing. ? Thinking. ? Mood. This information is not intended to replace advice given to you by your health care provider. Make sure you discuss any questions you have with your health care provider. Document Released: 10/02/2007 Document Revised: 12/17/2015 Document Reviewed: 07/21/2013 Elsevier Interactive Patient Education  2018 [employer]. Essential Tremor A tremor is trembling or shaking that you cannot control. Most tremors affect the hands or arms. Tremors can also affect the head, vocal cords, face, and other parts of the body. Essential tremor is a tremor without a known cause. What are the causes? Essential tremor has no known cause. What increases the risk? You may be at greater risk of essential tremor if: You have a family member with essential tremor. You are age 25 or older. You take certain medicines.  What are the signs or symptoms? The main sign of a tremor is uncontrolled and unintentional rhythmic shaking of a body part. You may have difficulty eating with a spoon or fork. You may have difficulty writing. You may nod your head up and down or side to side. You may have a quivering voice.  Your tremors: May get worse over time.  May come and go. May be more noticeable on one side of your body. May get worse due to stress, fatigue, caffeine, and extreme heat or cold.  How is this diagnosed? Your health care provider can diagnose essential tremor based on your symptoms, medical history, and a physical examination. There is no single test to diagnose an essential tremor. However, your health care provider may perform a variety of tests to rule out other conditions. Tests may include: Blood and urine tests. Imaging studies of your brain, such  as: CT scan. MRI. A test that measures involuntary muscle movement (electromyogram).  How is this treated? Your tremors may go away without treatment. Mild tremors may not need treatment if they do not affect your day-to-day life. Severe tremors may need to be treated using one or a combination of the following options: Medicines. This may include medicine that is injected. Lifestyle changes. Physical therapy.  Follow these instructions at home: Take medicines only as directed by your health care provider. Limit alcohol intake to no more than 1 drink per day for nonpregnant women and 2 drinks per day for men. One drink equals 12 oz of beer, 5 oz of wine, or 1 oz of hard liquor. Do not use any tobacco products, including cigarettes, chewing tobacco, or electronic cigarettes. If you need help quitting, ask your health care provider. Take medicines only as directed by your health care provider. Avoid extreme heat or cold. Limit the amount of caffeine you consumeas directed by your health care provider. Try to get eight hours of sleep each night. Find ways to manage your stress, such as meditation or yoga. Keep all follow-up visits as directed by your health care provider. This is important. This includes any physical therapy visits. Contact a health care provider if: You experience any changes in the location or intensity of your tremors. You start having a tremor after starting a new medicine. You have tremor with other symptoms such as: Numbness. Tingling. Pain. Weakness. Your tremor gets worse. Your tremor interferes with your daily life. This information is not intended to replace advice given to you by your health care provider. Make sure you discuss any questions you have with your health care provider. Document Released: 05/06/2014 Document Revised: 09/21/2015 Document Reviewed: 10/11/2013 Elsevier Interactive Patient Education  [employer].

## 2017-02-16 ENCOUNTER — Encounter: Payer: Self-pay | Admitting: Family Medicine

## 2017-02-16 DIAGNOSIS — M79641 Pain in right hand: Secondary | ICD-10-CM

## 2017-02-16 DIAGNOSIS — E669 Obesity, unspecified: Secondary | ICD-10-CM

## 2017-02-16 DIAGNOSIS — Z1211 Encounter for screening for malignant neoplasm of colon: Secondary | ICD-10-CM | POA: Insufficient documentation

## 2017-02-16 HISTORY — DX: Obesity, unspecified: E66.9

## 2017-02-16 HISTORY — DX: Pain in right hand: M79.641

## 2017-02-16 NOTE — Assessment & Plan Note (Signed)
Given rx for Revatio

## 2017-02-16 NOTE — Assessment & Plan Note (Signed)
Encouraged DASH diet, decrease po intake and increase exercise as tolerated. Needs 7-8 hours of sleep nightly. Avoid trans fats, eat small, frequent meals every 4-5 hours with lean proteins, complex carbs and healthy fats. Minimize simple carbs 

## 2017-02-16 NOTE — Assessment & Plan Note (Signed)
CTs vs tendonitis, encouraged rest, ice and topical treatments if no resolution will need referral likely to sports med.

## 2017-02-16 NOTE — Assessment & Plan Note (Signed)
Patient referred to gastroenterology for colonoscopy

## 2017-02-16 NOTE — Assessment & Plan Note (Signed)
Encouraged heart healthy diet, increase exercise, avoid trans fats, consider a krill oil cap daily. Encouraged to start statin

## 2017-03-17 ENCOUNTER — Other Ambulatory Visit: Payer: Self-pay | Admitting: Family Medicine

## 2017-03-17 ENCOUNTER — Encounter: Payer: Self-pay | Admitting: Family Medicine

## 2017-03-17 DIAGNOSIS — Z1211 Encounter for screening for malignant neoplasm of colon: Secondary | ICD-10-CM

## 2017-03-17 DIAGNOSIS — Z800 Family history of malignant neoplasm of digestive organs: Secondary | ICD-10-CM

## 2017-03-19 ENCOUNTER — Telehealth: Payer: Self-pay

## 2017-03-19 NOTE — Telephone Encounter (Signed)
Rec'd from [facility] forwarded 5 pages to GI Provider Historical

## 2017-04-18 ENCOUNTER — Telehealth: Payer: Self-pay | Admitting: Internal Medicine

## 2017-04-18 NOTE — Telephone Encounter (Signed)
Referral received for patient to have next colon at this office. Last colon was in [place] about 8 years ago. Patient not requesting certain doctor. Referral and previous colon report placed on DOD for referral date 11.19.18; Dr.Pyrtle's desk for review.

## 2017-04-21 NOTE — Telephone Encounter (Signed)
Dr.Pyrtle reviewed records and states okay to schedule colon. Left message on vm for patient to call back and schedule. Records in referral folder for now.

## 2017-05-20 NOTE — Telephone Encounter (Signed)
Patient has not returned phone call to schedule. Records will be in "records reviewed" folder. °

## 2017-06-19 ENCOUNTER — Encounter: Payer: Self-pay | Admitting: Family Medicine

## 2017-07-16 ENCOUNTER — Encounter: Payer: Self-pay | Admitting: Internal Medicine

## 2017-08-04 ENCOUNTER — Ambulatory Visit (HOSPITAL_BASED_OUTPATIENT_CLINIC_OR_DEPARTMENT_OTHER)
Admission: RE | Admit: 2017-08-04 | Discharge: 2017-08-04 | Disposition: A | Discharge status: Home / Self Care | Payer: 59 | Source: Ambulatory Visit | Attending: Medical | Admitting: Medical

## 2017-08-04 ENCOUNTER — Telehealth: Payer: Self-pay | Admitting: Medical

## 2017-08-04 ENCOUNTER — Ambulatory Visit: Payer: 59 | Admitting: Medical

## 2017-08-04 ENCOUNTER — Encounter: Payer: Self-pay | Admitting: Medical

## 2017-08-04 VITALS — BP 124/84 | HR 73 | Temp 98.3°F | Resp 16 | Ht 71.0 in | Wt 242.6 lb

## 2017-08-04 DIAGNOSIS — G629 Polyneuropathy, unspecified: Secondary | ICD-10-CM | POA: Diagnosis not present

## 2017-08-04 DIAGNOSIS — M5442 Lumbago with sciatica, left side: Secondary | ICD-10-CM | POA: Diagnosis present

## 2017-08-04 DIAGNOSIS — M5432 Sciatica, left side: Secondary | ICD-10-CM

## 2017-08-04 DIAGNOSIS — M5441 Lumbago with sciatica, right side: Secondary | ICD-10-CM

## 2017-08-04 DIAGNOSIS — R252 Cramp and spasm: Secondary | ICD-10-CM | POA: Diagnosis not present

## 2017-08-04 DIAGNOSIS — M5431 Sciatica, right side: Secondary | ICD-10-CM

## 2017-08-04 DIAGNOSIS — M5136 Other intervertebral disc degeneration, lumbar region: Secondary | ICD-10-CM | POA: Insufficient documentation

## 2017-08-04 LAB — COMPREHENSIVE METABOLIC PANEL
ALBUMIN: 4.5 g/dL (ref 3.5–5.2)
ALK PHOS: 64 U/L (ref 39–117)
ALT: 30 U/L (ref 0–53)
AST: 21 U/L (ref 0–37)
BUN: 18 mg/dL (ref 6–23)
CHLORIDE: 105 meq/L (ref 96–112)
CO2: 28 mEq/L (ref 19–32)
Calcium: 9.4 mg/dL (ref 8.4–10.5)
Creatinine, Ser: 0.92 mg/dL (ref 0.40–1.50)
GFR: 91.28 mL/min (ref >60.00)
Glucose, Bld: 93 mg/dL (ref 70–99)
POTASSIUM: 4.1 meq/L (ref 3.5–5.1)
Sodium: 139 mEq/L (ref 135–145)
TOTAL PROTEIN: 7.3 g/dL (ref 6.0–8.3)
Total Bilirubin: 0.7 mg/dL (ref 0.2–1.2)

## 2017-08-04 LAB — VITAMIN B12: Vitamin B-12: 318 pg/mL (ref 211–911)

## 2017-08-04 LAB — MAGNESIUM: MAGNESIUM: 2 mg/dL (ref 1.5–2.5)

## 2017-08-04 MED ORDER — MELOXICAM 15 MG PO TABS: 15.0000 mg | ORAL_TABLET | Freq: Every day | ORAL | 0 refills | Status: DC

## 2017-08-04 MED ORDER — GABAPENTIN 100 MG PO CAPS: 100.0000 mg | ORAL_CAPSULE | Freq: Every day | ORAL | 0 refills | Status: DC

## 2017-08-04 MED ORDER — CYCLOBENZAPRINE HCL 10 MG PO TABS: 10.0000 mg | ORAL_TABLET | Freq: Every day | ORAL | 0 refills | Status: DC

## 2017-08-04 MED FILL — MELOXICAM 15 MG TABLET: 15 | 15 days supply | Qty: 15 | Fill #0

## 2017-08-04 MED FILL — CYCLOBENZAPRINE HCL 10 MG T: 10 | 7 days supply | Qty: 7 | Fill #0

## 2017-08-04 MED FILL — GABAPENTIN 100 MG CAPSULE: 100 | 10 days supply | Qty: 10 | Fill #0

## 2017-08-04 NOTE — Telephone Encounter (Signed)
Dr. Charlett Blake,  I sent you note on Mr. Pilling. Just wanted you to review as his symptoms are little atypical. Most of his symptoms are burning and tingling. I am doing cmp and b vitamin studies and xray.   MRI of head negative in 2015. Reviewed chart due to some atypical features. No optic neuritis type complaints today.  Just wanted you to be aware.  Thanks, Mackie Pai, PA-C

## 2017-08-04 NOTE — Telephone Encounter (Signed)
I pain does not improve would proceed with MRI also might check labs for sed rate, lyme and RMSF cuz these can present with neurologic symptom. Thanks Dr B

## 2017-08-04 NOTE — Progress Notes (Signed)
Subjective:    Patient ID: Ronald Griffin, male    DOB: March 28, 1966, 52 y.o.   MRN: [id number]  HPI   Pt in states on Saturday he felt some numbness to his left leg and his foot fell asleep.  Then rt leg and foot started to feel numbness as well.   He states numbness both anterior and posterior aspect of both legs.  When he sits and puts pressure on buttock his numbness sensation worsens. When stands numbness and tingling sensation improves.  Pt did call medical advise service on line to get opinion. Only had pt check pulses of lower ext compared to upper extremities.  He has some tingling in both Sciatica areas.   Pt states skin on buttock and lower leg tender to light touch and burning sensation.  Pt tried some ibuprofen over the counter. But it did not help much at all. Then he tried old meloxicam and vicodin. Then later tried flexeril. He states these helped some.(he had some old tabs from previous visits).  Pt does have history of sciatica in past in August. Pt admits that time in past had more pain.   Some muscle cramps recently. Left hip  Cramps when changes position. Also some in calf cramping.  Also mentions remote fall. 2.5 weeks ago. Fell on lower back area. But symptoms started on Saturday.    Review of Systems  Constitutional: Negative for chills, fatigue, fever and unexpected weight change.  Eyes: Negative for photophobia, pain, discharge, itching and visual disturbance.  Respiratory: Negative for cough, chest tightness, shortness of breath and wheezing.   Cardiovascular: Negative for chest pain and palpitations.  Gastrointestinal: Negative for abdominal pain, nausea and vomiting.  Genitourinary: Negative for difficulty urinating, dysuria, enuresis, hematuria, penile swelling and scrotal swelling.  Musculoskeletal: Positive for back pain.       More sciatica type. Rt side is worse.  Skin: Negative for rash.  Neurological: Negative for dizziness, seizures,  syncope, weakness and headaches.       See hpi  Psychiatric/Behavioral: Negative for agitation, confusion, dysphoric mood, self-injury and suicidal ideas.     Past Medical History:  Diagnosis Date  . Dizziness   . Elevated antinuclear antibody (ANA) level 11/21/2014  . Erectile dysfunction 11/21/2014  . Frequent headaches   . Hand pain, right 02/16/2017  . Headache 03/29/2014  . History of colonic polyps 11/21/2014  . Hyperlipidemia   . Knee pain, right 11/21/2014  . Motion sickness 11/21/2014  . Obesity 02/16/2017  . Preventative health care 11/21/2014     Social History   Socioeconomic History  . Marital status: Married    Spouse name: [name]  . Number of children: 2  . Years of education: college  . Highest education level: Not on file  Occupational History  . Not on file  Social Needs  . Financial resource strain: Not on file  . Food insecurity:    Worry: Not on file    Inability: Not on file  . Transportation needs:    Medical: Not on file    Non-medical: Not on file  Tobacco Use  . Smoking status: Never Smoker  . Smokeless tobacco: Never Used  Substance and Sexual Activity  . Alcohol use: Yes    Alcohol/week: 1.2 oz    Types: 2 Cans of beer per week    Comment: weekly  . Drug use: No  . Sexual activity: Yes    Comment: lives with wife, works in [employer], part time  at [employer], no dietary restrictions, exercises regularly  Lifestyle  . Physical activity:    Days per week: Not on file    Minutes per session: Not on file  . Stress: Not on file  Relationships  . Social connections:    Talks on phone: Not on file    Gets together: Not on file    Attends religious service: Not on file    Active member of club or organization: Not on file    Attends meetings of clubs or organizations: Not on file    Relationship status: Not on file  . Intimate partner violence:    Fear of current or ex partner: Not on file    Emotionally abused: Not on file     Physically abused: Not on file    Forced sexual activity: Not on file  Other Topics Concern  . Not on file  Social History Narrative   Patient works full time for [employer]. Patient lives at home with his wife [name]).   Education college.   Right handed.   Caffeine 4-6 cups daily.     Past Surgical History:  Procedure Laterality Date  . SKIN BIOPSY    . VARICOCELE EXCISION    . VASECTOMY      Family History  Problem Relation Age of Onset  . Lung cancer Mother   . Breast cancer Mother   . Cancer Mother        breast age 59s, colon late 76s, lung, liver, skin  . Bipolar disorder Father   . Cancer Father 58       colon cancer  . Dementia Father   . Cancer Maternal Grandmother        ?  Marland Kitchen Heart disease Maternal Grandfather        MI, sudden  . Stroke Paternal Grandmother 49  . Bipolar disorder Paternal Grandfather   . Dementia Paternal Grandfather   . Cancer Paternal Grandfather        colon  . Obesity Brother     No Known Allergies  Current Outpatient Medications on File Prior to Visit  Medication Sig Dispense Refill  . diclofenac (VOLTAREN) 75 MG EC tablet Take 1 tablet (75 mg total) by mouth 2 (two) times daily. 20 tablet 0  . sildenafil (REVATIO) 20 MG tablet Take 1-4 tablets (20-80 mg total) by mouth 3 (three) times daily. 35 tablet 1   No current facility-administered medications on file prior to visit.     BP 124/84   Pulse 73   Temp 98.3 F (36.8 C) (Oral)   Resp 16   Ht 5\' 11"  (1.803 m)   Wt 242 lb 9.6 oz (110 kg)   SpO2 100%   BMI 33.84 kg/m       Objective:   Physical Exam  General Appearance- Not in acute distress.    Chest and Lung Exam Auscultation: Breath sounds:-Normal. Clear even and unlabored. Adventitious sounds:- No Adventitious sounds.  Cardiovascular Auscultation:Rythm - Regular, rate and rythm. Heart Sounds -Normal heart sounds.  Abdomen Inspection:-Inspection Normal.  Palpation/Perucssion:  Palpation and Percussion of the abdomen reveal- Non Tender, No Rebound tenderness, No rigidity(Guarding) and No Palpable abdominal masses.  Liver:-Normal.  Spleen:- Normal.   Back No mid lumbar spine tenderness to palpation. But si area tenderness.  Pain on straight leg lift. Pain on lateral movements and flexion/extension of the spine.  Lower ext neurologic  L5-S1 sensation intact bilaterally. Normal patellar reflexes bilaterally. No foot drop bilaterally.  Pt has slight burning to left lower legs when rubbing legs slightly on both pretibial area will have tingling and burning sensation.  Bilateral lower ext- left side 4/5 strength. Rt side 5/5.    Assessment & Plan:  For your lower back pain and partial sciatica features will get back x-ray today.  Will rx meloxicam for pain and flexeril muscle relaxant. Some pain appears neuropathic and rx gabapentin for night time use.  For leg cramps will get cmp and mg level.  Some of your symptoms don't appear exclusively from sciatica type. So adding b12 and b1 level.  Please update me how you are doing over next 2 days. If you have any leg weakness or incontinence please let me know. Reviewed signs and symptoms that would indicate ED evaluation immediatley.  Follow up in  7 days or as needed  [employer], [employer]

## 2017-08-04 NOTE — Patient Instructions (Signed)
For your lower back pain and partial sciatica features will get back x-ray today.  Will rx meloxicam for pain and flexeril muscle relaxant. Some pain appears neuropathic and rx gabapentin for night time use.  For leg cramps will get cmp and mg level.  Some of your symptoms don't appear exclusively from sciatica type. So adding b12 and b1 level.  Please update me how you are doing over next 2 days. If you have any leg weakness or incontinence please let me know. Reviewed signs and symptoms that would indicate ED evaluation immediatley.  Follow up in  7 days or as needed

## 2017-08-05 LAB — TIQ-NTM

## 2017-08-05 LAB — VITAMIN B1

## 2017-08-07 ENCOUNTER — Telehealth: Payer: Self-pay | Admitting: Family Medicine

## 2017-08-07 NOTE — Telephone Encounter (Signed)
Copied from [place] [id number]. Topic: Quick Communication - Lab Results >> Aug 06, 2017 10:54 AM Lennox Solders wrote: Pt saw edward saguier on 08-04-17 and would like blood work results and back xray  >> Aug 07, 2017 10:30 AM Selinda Flavin B, NT wrote: Patient calling to check status on getting lab and xray results. He would like to get these results before the weekend because he is still having symptoms of the numbness in his legs.     Patient calling back to check status on getting lab and x-ray results. Would like a call ASAP to prevent patient from going to Urgent Care.

## 2017-08-08 ENCOUNTER — Ambulatory Visit: Payer: Self-pay | Admitting: *Deleted

## 2017-08-08 ENCOUNTER — Other Ambulatory Visit (INDEPENDENT_AMBULATORY_CARE_PROVIDER_SITE_OTHER): Payer: 59

## 2017-08-08 DIAGNOSIS — G629 Polyneuropathy, unspecified: Secondary | ICD-10-CM | POA: Diagnosis not present

## 2017-08-08 DIAGNOSIS — R252 Cramp and spasm: Secondary | ICD-10-CM | POA: Diagnosis not present

## 2017-08-08 LAB — SEDIMENTATION RATE: Sed Rate: 7 mm/hr (ref 0–20)

## 2017-08-08 NOTE — Telephone Encounter (Signed)
Patient talked to Dr. Charlett Blake today .

## 2017-08-08 NOTE — Telephone Encounter (Signed)
He needs to be seen by someone. I am way over booked this am but we could do an 11:45 ish or the ER

## 2017-08-08 NOTE — Telephone Encounter (Signed)
Spoke with patient he has appointment next week with PCP

## 2017-08-08 NOTE — Addendum Note (Signed)
Addended by: Magdalene Molly A on: 08/08/2017 12:06 PM   Modules accepted: Orders

## 2017-08-08 NOTE — Telephone Encounter (Signed)
Would you like for the patient to follow up at the ER.  Nurse felt like he should be seen today.

## 2017-08-08 NOTE — Telephone Encounter (Signed)
Patient states he does not have shortness of breath, he states does not be seen he wanted to know what should he do next as far as the pain he is having.     Advised the patient I will order lab work per Dr. Charlett Blake and also try to get an MRI approved.   He will come in today to have lab work done and come in to see PCP on Thursday to discuss FMLA paperwork for his condition.   Patient voiced his understanding.     Dr. Charlett Blake can you proceed with the MRI

## 2017-08-08 NOTE — Telephone Encounter (Signed)
Pt  Called  For results   - [facility]   [phone]  Accessed   Xray  Results -  How  Is  Patient  Doing ?    Pt  Reports   Still having  Numbness  In legs  Little to no  Improvement  Symptoms  Still present  Pt  Reports  Has  Localized pain in   L side pain  radiating to  Back   Pt  Reports  Burning  Sensation   Both legs - worse  On left   Pt  Reports  Swelling of  Both  Ankles  For  2  Days  -  New  Symptom    [phone]

## 2017-08-09 NOTE — Telephone Encounter (Signed)
If his symptoms persist and his labs are negative he will need to be seen so we can evaluate and decide on next set of tests

## 2017-08-11 ENCOUNTER — Encounter: Payer: Self-pay | Admitting: Medical

## 2017-08-14 ENCOUNTER — Ambulatory Visit: Payer: 59 | Admitting: Family Medicine

## 2017-08-14 ENCOUNTER — Encounter: Payer: Self-pay | Admitting: Family Medicine

## 2017-08-14 VITALS — BP 120/90 | HR 78 | Temp 97.8°F | Resp 16 | Ht 71.0 in | Wt 240.4 lb

## 2017-08-14 DIAGNOSIS — M545 Low back pain, unspecified: Secondary | ICD-10-CM

## 2017-08-14 DIAGNOSIS — E669 Obesity, unspecified: Secondary | ICD-10-CM | POA: Diagnosis not present

## 2017-08-14 DIAGNOSIS — M5441 Lumbago with sciatica, right side: Secondary | ICD-10-CM

## 2017-08-14 DIAGNOSIS — M5442 Lumbago with sciatica, left side: Secondary | ICD-10-CM

## 2017-08-14 LAB — TEST AUTHORIZATION

## 2017-08-14 LAB — ROCKY MTN SPOTTED FVR ABS PNL(IGG+IGM)
RMSF IGG: NOT DETECTED
RMSF IgM: NOT DETECTED

## 2017-08-14 LAB — LYME DISEASE ABS IGG, IGM, IFA, CSF

## 2017-08-14 LAB — B. BURGDORFI ANTIBODIES

## 2017-08-14 MED ORDER — METHYLPREDNISOLONE 4 MG PO TABS: ORAL_TABLET | ORAL | 0 refills | Status: DC

## 2017-08-14 NOTE — Patient Instructions (Signed)

## 2017-08-16 ENCOUNTER — Ambulatory Visit (HOSPITAL_BASED_OUTPATIENT_CLINIC_OR_DEPARTMENT_OTHER): Payer: 59

## 2017-08-18 ENCOUNTER — Encounter: Payer: Self-pay | Admitting: Family Medicine

## 2017-08-18 NOTE — Assessment & Plan Note (Addendum)
[place] when some stais he was on broke away on 07/02/2017. He has had increasing back pain with weakness, numbness and pain into left leg>right leg. He is given a prescription for Medrol and an MRI of low back is order. No strenuous exercise or lifting is kept out of work until the MRI is complete due to his symptoms. He will seek care if they worsen suddenly. Spent 45 minutes in visit.

## 2017-08-18 NOTE — Progress Notes (Signed)
Patient ID: Ronald Griffin, male   DOB: 1966-01-31, 52 y.o.   MRN: [id number]   Subjective:    Patient ID: [name], male    DOB: 01/01/1966, 52 y.o.   MRN: [id number]  Chief Complaint  Patient presents with  . Numbness    Pt reports numbness and discomfort in lower left side of back with bilateral leg numbness x 2 weeks. Pt has completed flexeril and neurontin without much improvement. Continues to take meloxicam. Pt requests work note to be out of work through Monday and brings FMLA forms for intermittent absences.    HPI Patient is in today for evaluation of his ongoing low back pain with radiculopathy to b/l legs and to have FMLA paperwork completed. He fell on March 6 while on some stairs that collapsed. He initially had some pain but manageable so only sought some urgent care but slowly over the past few weeks his have worsened. His back is worsening ad he has trouble getting comfortable. He is noting increasing weakness in his legs with some numbness and pain as well. No incontinence. Left side is worse than right side. Denies CP/palp/SOB/HA/congestion/fevers/GI or GU c/o. Taking meds as prescribed  Past Medical History:  Diagnosis Date  . Dizziness   . Elevated antinuclear antibody (ANA) level 11/21/2014  . Erectile dysfunction 11/21/2014  . Frequent headaches   . Hand pain, right 02/16/2017  . Headache 03/29/2014  . History of colonic polyps 11/21/2014  . Hyperlipidemia   . Knee pain, right 11/21/2014  . Low back pain 04/03/2015  . Motion sickness 11/21/2014  . Obesity 02/16/2017  . Preventative health care 11/21/2014    Past Surgical History:  Procedure Laterality Date  . SKIN BIOPSY    . VARICOCELE EXCISION    . VASECTOMY      Family History  Problem Relation Age of Onset  . Lung cancer Mother   . Breast cancer Mother   . Cancer Mother        breast age 59s, colon late 48s, lung, liver, skin  . Bipolar disorder Father   . Cancer Father 50       colon cancer  .  Dementia Father   . Cancer Maternal Grandmother        ?  Marland Kitchen Heart disease Maternal Grandfather        MI, sudden  . Stroke Paternal Grandmother 72  . Bipolar disorder Paternal Grandfather   . Dementia Paternal Grandfather   . Cancer Paternal Grandfather        colon  . Obesity Brother     Social History   Socioeconomic History  . Marital status: Married    Spouse name: [name]  . Number of children: 2  . Years of education: college  . Highest education level: Not on file  Occupational History  . Not on file  Social Needs  . Financial resource strain: Not on file  . Food insecurity:    Worry: Not on file    Inability: Not on file  . Transportation needs:    Medical: Not on file    Non-medical: Not on file  Tobacco Use  . Smoking status: Never Smoker  . Smokeless tobacco: Never Used  Substance and Sexual Activity  . Alcohol use: Yes    Alcohol/week: 1.2 oz    Types: 2 Cans of beer per week    Comment: weekly  . Drug use: No  . Sexual activity: Yes    Comment: lives with wife,  works in [employer], part time at [employer], no dietary restrictions, exercises regularly  Lifestyle  . Physical activity:    Days per week: Not on file    Minutes per session: Not on file  . Stress: Not on file  Relationships  . Social connections:    Talks on phone: Not on file    Gets together: Not on file    Attends religious service: Not on file    Active member of club or organization: Not on file    Attends meetings of clubs or organizations: Not on file    Relationship status: Not on file  . Intimate partner violence:    Fear of current or ex partner: Not on file    Emotionally abused: Not on file    Physically abused: Not on file    Forced sexual activity: Not on file  Other Topics Concern  . Not on file  Social History Narrative   Patient works full time for [employer]. [employer]. Patient lives at home with his wife [name]).   Education college.   Right  handed.   Caffeine 4-6 cups daily.     Outpatient Medications Prior to Visit  Medication Sig Dispense Refill  . meloxicam (MOBIC) 15 MG tablet Take 1 tablet (15 mg total) by mouth daily. 15 tablet 0  . sildenafil (REVATIO) 20 MG tablet Take 1-4 tablets (20-80 mg total) by mouth 3 (three) times daily. 35 tablet 1  . cyclobenzaprine (FLEXERIL) 10 MG tablet Take 1 tablet (10 mg total) by mouth at bedtime. (Patient not taking: Reported on 08/14/2017) 7 tablet 0  . diclofenac (VOLTAREN) 75 MG EC tablet Take 1 tablet (75 mg total) by mouth 2 (two) times daily. (Patient not taking: Reported on 08/14/2017) 20 tablet 0  . gabapentin (NEURONTIN) 100 MG capsule Take 1 capsule (100 mg total) by mouth at bedtime. (Patient not taking: Reported on 08/14/2017) 10 capsule 0   No facility-administered medications prior to visit.     No Known Allergies  Review of Systems  Constitutional: Negative for fever and malaise/fatigue.  HENT: Negative for congestion.   Eyes: Negative for blurred vision.  Respiratory: Negative for shortness of breath.   Cardiovascular: Negative for chest pain, palpitations and leg swelling.  Gastrointestinal: Negative for abdominal pain, blood in stool and nausea.  Genitourinary: Negative for dysuria and frequency.  Musculoskeletal: Positive for back pain and joint pain. Negative for falls.  Skin: Negative for rash.  Neurological: Positive for sensory change and weakness. Negative for dizziness, loss of consciousness and headaches.  Endo/Heme/Allergies: Negative for environmental allergies.  Psychiatric/Behavioral: Negative for depression. The patient is not nervous/anxious.        Objective:    Physical Exam  Constitutional: He is oriented to person, place, and time. He appears well-developed and well-nourished. No distress.  HENT:  Head: Normocephalic and atraumatic.  Nose: Nose normal.  Eyes: Right eye exhibits no discharge. Left eye exhibits no discharge.  Neck: Normal  range of motion. Neck supple.  Cardiovascular: Normal rate and regular rhythm.  No murmur heard. Pulmonary/Chest: Effort normal and breath sounds normal.  Abdominal: Soft. Bowel sounds are normal. There is no tenderness.  Musculoskeletal: He exhibits no edema.  Neurological: He is alert and oriented to person, place, and time.  Skin: Skin is warm and dry.  Psychiatric: He has a normal mood and affect.  Nursing note and vitals reviewed.   BP 120/90 (BP Location: Left Arm, Cuff Size: Large)   Pulse  78   Temp 97.8 F (36.6 C) (Oral)   Resp 16   Ht 5\' 11"  (1.803 m)   Wt 240 lb 6.4 oz (109 kg)   SpO2 98%   BMI 33.53 kg/m  Wt Readings from Last 3 Encounters:  08/14/17 240 lb 6.4 oz (109 kg)  08/04/17 242 lb 9.6 oz (110 kg)  12/31/16 239 lb 6.7 oz (108.6 kg)     Lab Results  Component Value Date   WBC 5.5 02/14/2017   HGB 14.7 02/14/2017   HCT 44.1 02/14/2017   PLT 217.0 02/14/2017   GLUCOSE 93 08/04/2017   CHOL 234 (H) 02/14/2017   TRIG 201.0 (H) 02/14/2017   HDL 53.30 02/14/2017   LDLDIRECT 140.0 02/14/2017   LDLCALC 108 (H) 11/21/2014   ALT 30 08/04/2017   AST 21 08/04/2017   NA 139 08/04/2017   K 4.1 08/04/2017   CL 105 08/04/2017   CREATININE 0.92 08/04/2017   BUN 18 08/04/2017   CO2 28 08/04/2017   TSH 4.45 02/14/2017   PSA 0.50 11/21/2014    Lab Results  Component Value Date   TSH 4.45 02/14/2017   Lab Results  Component Value Date   WBC 5.5 02/14/2017   HGB 14.7 02/14/2017   HCT 44.1 02/14/2017   MCV 93.8 02/14/2017   PLT 217.0 02/14/2017   Lab Results  Component Value Date   NA 139 08/04/2017   K 4.1 08/04/2017   CO2 28 08/04/2017   GLUCOSE 93 08/04/2017   BUN 18 08/04/2017   CREATININE 0.92 08/04/2017   BILITOT 0.7 08/04/2017   ALKPHOS 64 08/04/2017   AST 21 08/04/2017   ALT 30 08/04/2017   PROT 7.3 08/04/2017   ALBUMIN 4.5 08/04/2017   CALCIUM 9.4 08/04/2017   ANIONGAP 13 03/15/2014   GFR 91.28 08/04/2017   Lab Results  Component  Value Date   CHOL 234 (H) 02/14/2017   Lab Results  Component Value Date   HDL 53.30 02/14/2017   Lab Results  Component Value Date   LDLCALC 108 (H) 11/21/2014   Lab Results  Component Value Date   TRIG 201.0 (H) 02/14/2017   Lab Results  Component Value Date   CHOLHDL 4 02/14/2017   No results found for: HGBA1C     Assessment & Plan:   Problem List Items Addressed This Visit    Low back pain    Fell when some stais he was on broke away on 07/02/2017. He has had increasing back pain with weakness, numbness and pain into left leg>right leg. He is given a prescription for Medrol and an MRI of low back is order. No strenuous exercise or lifting is kept out of work until the MRI is complete due to his symptoms. He will seek care if they worsen suddenly. Spent 45 minutes in visit.       Relevant Medications   methylPREDNISolone (MEDROL) 4 MG tablet   Obesity    Encouraged DASH diet, decrease po intake and increase exercise as tolerated. Needs 7-8 hours of sleep nightly. Avoid trans fats, eat small, frequent meals every 4-5 hours with lean proteins, complex carbs and healthy fats. Minimize simple carbs       Other Visit Diagnoses    Low back pain radiating to both legs    -  Primary   Relevant Medications   methylPREDNISolone (MEDROL) 4 MG tablet   Other Relevant Orders   MR Lumbar Spine Wo Contrast      I have discontinued  Pablo Ledger. Konicki's diclofenac, cyclobenzaprine, and gabapentin. I am also having him start on methylPREDNISolone. Additionally, I am having him maintain his sildenafil and meloxicam.  Meds ordered this encounter  Medications  . methylPREDNISolone (MEDROL) 4 MG tablet    Sig: 5 tab po qd X 1d then 4 tab po qd X 1d then 3 tab po qd X 1d then 2 tab po qd then 1 tab po qd    Dispense:  15 tablet    Refill:  0     Penni Homans, MD

## 2017-08-18 NOTE — Assessment & Plan Note (Signed)
Encouraged DASH diet, decrease po intake and increase exercise as tolerated. Needs 7-8 hours of sleep nightly. Avoid trans fats, eat small, frequent meals every 4-5 hours with lean proteins, complex carbs and healthy fats. Minimize simple carbs 

## 2017-08-20 ENCOUNTER — Telehealth: Payer: Self-pay | Admitting: *Deleted

## 2017-08-20 ENCOUNTER — Ambulatory Visit (HOSPITAL_COMMUNITY)
Admission: RE | Admit: 2017-08-20 | Discharge: 2017-08-20 | Disposition: A | Discharge status: Home / Self Care | Payer: 59 | Source: Ambulatory Visit | Attending: Family Medicine | Admitting: Family Medicine

## 2017-08-20 DIAGNOSIS — M4805 Spinal stenosis, thoracolumbar region: Secondary | ICD-10-CM | POA: Insufficient documentation

## 2017-08-20 DIAGNOSIS — M545 Low back pain, unspecified: Secondary | ICD-10-CM

## 2017-08-20 NOTE — Telephone Encounter (Signed)
Received completed FMLA paperwork from [place]; faxed with confirmation/SLS 04/24

## 2017-08-25 ENCOUNTER — Telehealth: Payer: Self-pay | Admitting: Family Medicine

## 2017-08-25 DIAGNOSIS — M5441 Lumbago with sciatica, right side: Secondary | ICD-10-CM

## 2017-08-25 DIAGNOSIS — M545 Low back pain: Secondary | ICD-10-CM

## 2017-08-25 DIAGNOSIS — M5442 Lumbago with sciatica, left side: Secondary | ICD-10-CM

## 2017-08-25 DIAGNOSIS — M79605 Pain in left leg: Secondary | ICD-10-CM

## 2017-08-25 NOTE — Telephone Encounter (Signed)
Orders placed for ortho referral

## 2017-08-26 ENCOUNTER — Encounter: Payer: Self-pay | Admitting: Family Medicine

## 2017-09-01 ENCOUNTER — Ambulatory Visit (AMBULATORY_SURGERY_CENTER): Payer: Self-pay | Admitting: *Deleted

## 2017-09-01 ENCOUNTER — Other Ambulatory Visit: Payer: Self-pay

## 2017-09-01 VITALS — Ht 70.0 in | Wt 240.0 lb

## 2017-09-01 DIAGNOSIS — Z800 Family history of malignant neoplasm of digestive organs: Secondary | ICD-10-CM

## 2017-09-01 MED ORDER — NA SULFATE-K SULFATE-MG SULF 17.5-3.13-1.6 GM/177ML PO SOLN: ORAL | 0 refills | Status: DC

## 2017-09-01 NOTE — Progress Notes (Signed)
Patient denies any allergies to eggs or soy. Patient denies any problems with anesthesia/sedation. Patient denies any oxygen use at home. Patient denies taking any diet/weight loss medications or blood thinners. EMMI education assisgned to patient on colonoscopy, this was explained and instructions given to patient. Suprep coupon given to pt.  

## 2017-09-02 ENCOUNTER — Encounter: Payer: Self-pay | Admitting: Internal Medicine

## 2017-09-02 NOTE — Telephone Encounter (Signed)
Referral has been changed to reflect Dr.Pool. Could not find Dr.Pool in the search bar in the referral

## 2017-09-02 NOTE — Telephone Encounter (Signed)
Resent referral  

## 2017-09-02 NOTE — Addendum Note (Signed)
Addended by: Magdalene Molly A on: 09/02/2017 08:08 AM   Modules accepted: Orders

## 2017-09-02 NOTE — Addendum Note (Signed)
Addended by: Magdalene Molly A on: 09/02/2017 10:23 AM   Modules accepted: Orders

## 2017-09-02 NOTE — Telephone Encounter (Signed)
Completed as much as possible, called patient to get needed information to complete [Dates for work & Activity Ability]; [facility] with contact name and number for return call RE: completion of paperwork/SLS 05/06

## 2017-09-13 ENCOUNTER — Telehealth: Payer: Self-pay | Admitting: Physician Assistant

## 2017-09-13 NOTE — Telephone Encounter (Signed)
[facility]  [facility] on weekends.  His colonoscopy is on Monday.   In place of Rx Suprep, instructed him to purchase Miralax 255gm and 64 oz Gatorade.  Mix these together and use this as his split dose prep.  Also asked him to take a dulcolax tonight and in AM.  Could take a 3rd Dulcolax at start of prep if had not had significant BM after 2 doses of Dulcolax.    Azucena Freed PA-C

## 2017-09-15 ENCOUNTER — Encounter: Payer: Self-pay | Admitting: Internal Medicine

## 2017-09-15 ENCOUNTER — Ambulatory Visit (AMBULATORY_SURGERY_CENTER): Payer: 59 | Admitting: Internal Medicine

## 2017-09-15 ENCOUNTER — Other Ambulatory Visit: Payer: Self-pay

## 2017-09-15 VITALS — BP 100/63 | HR 51 | Temp 98.9°F | Resp 10 | Ht 70.0 in | Wt 240.0 lb

## 2017-09-15 DIAGNOSIS — K635 Polyp of colon: Secondary | ICD-10-CM | POA: Diagnosis not present

## 2017-09-15 DIAGNOSIS — Z800 Family history of malignant neoplasm of digestive organs: Secondary | ICD-10-CM | POA: Diagnosis not present

## 2017-09-15 DIAGNOSIS — Z1211 Encounter for screening for malignant neoplasm of colon: Secondary | ICD-10-CM

## 2017-09-15 DIAGNOSIS — D123 Benign neoplasm of transverse colon: Secondary | ICD-10-CM

## 2017-09-15 DIAGNOSIS — D125 Benign neoplasm of sigmoid colon: Secondary | ICD-10-CM

## 2017-09-15 MED ORDER — SODIUM CHLORIDE 0.9 % IV SOLN: 500.0000 mL | Freq: Once | INTRAVENOUS | Status: DC

## 2017-09-15 NOTE — Progress Notes (Signed)
No changes in medical or surgical since PV per pt 

## 2017-09-15 NOTE — Progress Notes (Signed)
Report given to PACU, vss 

## 2017-09-15 NOTE — Op Note (Signed)
[facility] Patient Name: Ronald Griffin Procedure Date: 09/15/2017 1:35 PM MRN: [id number] Endoscopist: Jerene Bears , MD Age: 52 Referring MD:  Date of Birth: [birth year] Gender: Male Account #: [id number] Procedure:                Colonoscopy Indications:              Screening in patient at increased risk: Family                            history of 1st-degree relative with colorectal                            cancer; last colonoscopy 5 years ago in [place]:                Monitored Anesthesia Care Procedure:                Pre-Anesthesia Assessment:                           - Prior to the procedure, a History and Physical                            was performed, and patient medications and                            allergies were reviewed. The patient's tolerance of                            previous anesthesia was also reviewed. The risks                            and benefits of the procedure and the sedation                            options and risks were discussed with the patient.                            All questions were answered, and informed consent                            was obtained. Prior Anticoagulants: The patient has                            taken no previous anticoagulant or antiplatelet                            agents. ASA Grade Assessment: II - A patient with                            mild systemic disease. After reviewing the risks                            and benefits, the patient was deemed in  satisfactory condition to undergo the procedure.                           After obtaining informed consent, the colonoscope                            was passed under direct vision. Throughout the                            procedure, the patient's blood pressure, pulse, and                            oxygen saturations were monitored continuously. The                            Colonoscope was introduced  through the anus and                            advanced to the cecum, identified by appendiceal                            orifice and ileocecal valve. The colonoscopy was                            performed without difficulty. The patient tolerated                            the procedure well. The quality of the bowel                            preparation was good. Scope In: 1:44:03 PM Scope Out: 2:02:42 PM Scope Withdrawal Time: 0 hours 15 minutes 15 seconds  Total Procedure Duration: 0 hours 18 minutes 39 seconds  Findings:                 The digital rectal exam was normal.                           A 5 mm polyp was found in the transverse colon. The                            polyp was sessile. The polyp was removed with a                            cold snare. Resection and retrieval were complete.                           Five sessile polyps were found in the sigmoid                            colon. The polyps were 4 to 7 mm in size. These                            polyps were removed  with a cold snare. Resection                            and retrieval were complete.                           Internal hemorrhoids were found during                            retroflexion. The hemorrhoids were medium-sized. Complications:            No immediate complications. Estimated Blood Loss:     Estimated blood loss was minimal. Impression:               - One 5 mm polyp in the transverse colon, removed                            with a cold snare. Resected and retrieved.                           - Five 4 to 7 mm polyps in the sigmoid colon,                            removed with a cold snare. Resected and retrieved.                           - Internal hemorrhoids. Recommendation:           - Patient has a contact number available for                            emergencies. The signs and symptoms of potential                            delayed complications were discussed with the                             patient. Return to normal activities tomorrow.                            Written discharge instructions were provided to the                            patient.                           - Resume previous diet.                           - Continue present medications.                           - Await pathology results.                           - Repeat colonoscopy is recommended for  surveillance. The colonoscopy date will be                            determined after pathology results from today's                            exam become available for review. Jerene Bears, MD 09/15/2017 2:08:43 PM This report has been signed electronically.

## 2017-09-15 NOTE — Patient Instructions (Signed)
YOU HAD AN ENDOSCOPIC PROCEDURE TODAY AT THE [facility]:   Refer to the procedure report that was given to you for any specific questions about what was found during the examination.  If the procedure report does not answer your questions, please call your gastroenterologist to clarify.  If you requested that your care partner not be given the details of your procedure findings, then the procedure report has been included in a sealed envelope for you to review at your convenience later.  YOU SHOULD EXPECT: Some feelings of bloating in the abdomen. Passage of more gas than usual.  Walking can help get rid of the air that was put into your GI tract during the procedure and reduce the bloating. If you had a lower endoscopy (such as a colonoscopy or flexible sigmoidoscopy) you may notice spotting of blood in your stool or on the toilet paper. If you underwent a bowel prep for your procedure, you may not have a normal bowel movement for a few days.  Please Note:  You might notice some irritation and congestion in your nose or some drainage.  This is from the oxygen used during your procedure.  There is no need for concern and it should clear up in a day or so.  SYMPTOMS TO REPORT IMMEDIATELY:   Following lower endoscopy (colonoscopy or flexible sigmoidoscopy):  Excessive amounts of blood in the stool  Significant tenderness or worsening of abdominal pains  Swelling of the abdomen that is new, acute  Fever of 100F or higher  For urgent or emergent issues, a gastroenterologist can be reached at any hour by calling [phone].   DIET:  We do recommend a small meal at first, but then you may proceed to your regular diet.  Drink plenty of fluids but you should avoid alcoholic beverages for 24 hours.  MEDICATIONS: Continue present medications.  Please see handouts given to you by your recovery nurse.  ACTIVITY:  You should plan to take it easy for the rest of today and you should NOT  DRIVE or use heavy machinery until tomorrow (because of the sedation medicines used during the test).    FOLLOW UP: Our staff will call the number listed on your records the next business day following your procedure to check on you and address any questions or concerns that you may have regarding the information given to you following your procedure. If we do not reach you, we will leave a message.  However, if you are feeling well and you are not experiencing any problems, there is no need to return our call.  We will assume that you have returned to your regular daily activities without incident.  If any biopsies were taken you will be contacted by phone or by letter within the next 1-3 weeks.  Please call us at [phone] if you have not heard about the biopsies in 3 weeks.   Thank you for allowing us to provide for your healthcare needs today.   SIGNATURES/CONFIDENTIALITY: You and/or your care partner have signed paperwork which will be entered into your electronic medical record.  These signatures attest to the fact that that the information above on your After Visit Summary has been reviewed and is understood.  Full responsibility of the confidentiality of this discharge information lies with you and/or your care-partner. 

## 2017-09-15 NOTE — Progress Notes (Signed)
Called to room to assist during endoscopic procedure.  Patient ID and intended procedure confirmed with present staff. Received instructions for my participation in the procedure from the performing physician.  

## 2017-09-16 ENCOUNTER — Telehealth: Payer: Self-pay

## 2017-09-16 ENCOUNTER — Telehealth: Payer: Self-pay | Admitting: *Deleted

## 2017-09-16 NOTE — Telephone Encounter (Signed)
Left message

## 2017-09-16 NOTE — Telephone Encounter (Signed)
First call attempt, left message on voicemail.

## 2017-09-19 ENCOUNTER — Encounter: Payer: Self-pay | Admitting: Internal Medicine

## 2017-09-25 ENCOUNTER — Other Ambulatory Visit (HOSPITAL_COMMUNITY): Payer: Self-pay | Admitting: Neurosurgery

## 2017-09-25 DIAGNOSIS — M4714 Other spondylosis with myelopathy, thoracic region: Secondary | ICD-10-CM

## 2017-10-01 ENCOUNTER — Ambulatory Visit (HOSPITAL_COMMUNITY)
Admission: RE | Admit: 2017-10-01 | Discharge: 2017-10-01 | Disposition: A | Discharge status: Home / Self Care | Payer: 59 | Source: Ambulatory Visit | Attending: Neurosurgery | Admitting: Neurosurgery

## 2017-10-01 DIAGNOSIS — M4714 Other spondylosis with myelopathy, thoracic region: Secondary | ICD-10-CM | POA: Diagnosis present

## 2017-10-01 DIAGNOSIS — M5124 Other intervertebral disc displacement, thoracic region: Secondary | ICD-10-CM | POA: Insufficient documentation

## 2017-10-10 ENCOUNTER — Telehealth: Payer: Self-pay | Admitting: Family Medicine

## 2017-10-10 NOTE — Telephone Encounter (Signed)
Call patient to check status before requesting extention from Dr. Charlett Blake. Unable to leave message for return call,mailbox full.

## 2017-10-10 NOTE — Telephone Encounter (Signed)
Copied from [facility] [id number]. Topic: General - Other >> Oct 10, 2017 11:42 AM Lennox Solders wrote: Reason for CRM: pt is on FMLA and his leave will end on 10-23-17 and pt does not see dr pool until 10-23-17. Pt would like dr blyth nurse to return his call. Pt would like nurse recommendation concerning FMLA extension

## 2017-10-11 NOTE — Telephone Encounter (Signed)
His FMLA can be extended I assume his symptoms are still significant. He needs a follow up visit with me to do this or it will be questioned.

## 2017-10-20 ENCOUNTER — Encounter: Payer: Self-pay | Admitting: Family Medicine

## 2017-10-20 ENCOUNTER — Ambulatory Visit: Payer: 59 | Admitting: Family Medicine

## 2017-10-20 DIAGNOSIS — M5441 Lumbago with sciatica, right side: Secondary | ICD-10-CM

## 2017-10-20 DIAGNOSIS — E785 Hyperlipidemia, unspecified: Secondary | ICD-10-CM | POA: Diagnosis not present

## 2017-10-20 DIAGNOSIS — M5442 Lumbago with sciatica, left side: Secondary | ICD-10-CM | POA: Diagnosis not present

## 2017-10-20 NOTE — Progress Notes (Signed)
Subjective:  I acted as a Education administrator for Dr. Charlett Blake. [name], [place]. [facility]  Patient ID: Ronald Griffin, male    DOB: 1965/07/31, 52 y.o.   MRN: [id number]  No chief complaint on file.   HPI  Patient is in today for follow pu and evaluaiton of his ongoing back pain with radiculopathy. Continues to struggle with significant low back pain daily that limits his ability to manage his ADLs and has been unable to return to work. He has significant radiculopathy into his left foot with poor control of the foot and poor balance as a result. No recent falls. He is preparing for his consultation and is ready to proceed with the next step. He is managing without meds but is having to adjust his activity significantly. Denies CP/palp/SOB/HA/congestion/fevers/GI or GU c/o. Taking meds as prescribed. No incontinence.   Patient Care Team: Mosie Lukes, MD as PCP - General (Family Medicine) Earnie Larsson, MD as Consulting Physician (Neurosurgery)   Past Medical History:  Diagnosis Date  . Dizziness   . Elevated antinuclear antibody (ANA) level 11/21/2014  . Erectile dysfunction 11/21/2014  . Frequent headaches   . Hand pain, right 02/16/2017  . Headache 03/29/2014  . History of colonic polyps 11/21/2014  . Hyperlipidemia   . Knee pain, right 11/21/2014  . Low back pain 04/03/2015  . Motion sickness 11/21/2014  . Obesity 02/16/2017  . Post-operative nausea and vomiting   . Preventative health care 11/21/2014    Past Surgical History:  Procedure Laterality Date  . COLONOSCOPY  2012   in [place]    . VARICOCELE EXCISION    . VASECTOMY      Family History  Problem Relation Age of Onset  . Lung cancer Mother   . Breast cancer Mother   . Cancer Mother        breast age 30s, colon late 9s, lung, liver, skin  . Colon cancer Mother 45  . Bipolar disorder Father   . Cancer Father 80       colon cancer  . Dementia Father   . Colon cancer Father 3  . Cancer Maternal Grandmother        ?    Marland Kitchen Heart disease Maternal Grandfather        MI, sudden  . Stroke Paternal Grandmother 25  . Bipolar disorder Paternal Grandfather   . Dementia Paternal Grandfather   . Cancer Paternal Grandfather        colon  . Colon cancer Paternal Grandfather   . Obesity Brother   . Colon cancer Cousin 18  . Esophageal cancer Neg Hx   . Stomach cancer Neg Hx     Social History   Socioeconomic History  . Marital status: Married    Spouse name: Ronald Griffin  . Number of children: 2  . Years of education: college  . Highest education level: Not on file  Occupational History  . Not on file  Social Needs  . Financial resource strain: Not on file  . Food insecurity:    Worry: Not on file    Inability: Not on file  . Transportation needs:    Medical: Not on file    Non-medical: Not on file  Tobacco Use  . Smoking status: Never Smoker  . Smokeless tobacco: Never Used  Substance and Sexual Activity  . Alcohol use: Yes    Alcohol/week: 1.8 - 3.0 oz    Types: 3 - 5 Standard drinks or  equivalent per week    Comment: weekly 3-5 times per pt  . Drug use: No  . Sexual activity: Yes    Comment: lives with wife, works in [employer], part time at [employer], no dietary restrictions, exercises regularly  Lifestyle  . Physical activity:    Days per week: Not on file    Minutes per session: Not on file  . Stress: Not on file  Relationships  . Social connections:    Talks on phone: Not on file    Gets together: Not on file    Attends religious service: Not on file    Active member of club or organization: Not on file    Attends meetings of clubs or organizations: Not on file    Relationship status: Not on file  . Intimate partner violence:    Fear of current or ex partner: Not on file    Emotionally abused: Not on file    Physically abused: Not on file    Forced sexual activity: Not on file  Other Topics Concern  . Not on file  Social History Narrative   Patient works full time for  [employer]. Patient lives at home with his wife Ronald Griffin).   Education college.   Right handed.   Caffeine 4-6 cups daily.     Outpatient Medications Prior to Visit  Medication Sig Dispense Refill  . 0.9 %  sodium chloride infusion      No facility-administered medications prior to visit.     No Known Allergies  Review of Systems  Constitutional: Negative for fever and malaise/fatigue.  HENT: Negative for congestion.   Eyes: Negative for blurred vision.  Respiratory: Negative for shortness of breath.   Cardiovascular: Negative for chest pain, palpitations and leg swelling.  Gastrointestinal: Negative for abdominal pain, blood in stool and nausea.  Genitourinary: Negative for dysuria and frequency.  Musculoskeletal: Positive for back pain and joint pain. Negative for falls and neck pain.  Skin: Negative for rash.  Neurological: Positive for focal weakness. Negative for dizziness, loss of consciousness, weakness and headaches.  Endo/Heme/Allergies: Negative for environmental allergies.  Psychiatric/Behavioral: Negative for depression. The patient is not nervous/anxious.        Objective:    Physical Exam  Constitutional: He is oriented to person, place, and time. No distress.  HENT:  Head: Normocephalic and atraumatic.  Eyes: Conjunctivae are normal.  Neck: Neck supple. No thyromegaly present.  Cardiovascular: Normal rate, regular rhythm and normal heart sounds.  No murmur heard. Pulmonary/Chest: Effort normal and breath sounds normal. No respiratory distress.  Abdominal: He exhibits no distension and no mass. There is no tenderness.  Musculoskeletal: He exhibits no edema.  Neurological: He is alert and oriented to person, place, and time. Coordination abnormal.  Has uneven gait due to weakness in left leg  Skin: Skin is warm.  Psychiatric: Judgment normal.    BP 120/86 (BP Location: Left Arm, Patient Position: Sitting, Cuff Size: Normal)   Pulse 83    Temp 98 F (36.7 C) (Oral)   Resp 18   Ht 5\' 10"  (1.778 m)   Wt 237 lb 4.8 oz (107.6 kg)   SpO2 98%   BMI 34.05 kg/m  Wt Readings from Last 3 Encounters:  10/20/17 237 lb 4.8 oz (107.6 kg)  09/15/17 240 lb (108.9 kg)  09/01/17 240 lb (108.9 kg)   BP Readings from Last 3 Encounters:  10/20/17 120/86  09/15/17 100/63  08/14/17 120/90  Immunization History  Administered Date(s) Administered  . Tdap 04/29/2010    Health Maintenance  Topic Date Due  . INFLUENZA VACCINE  11/27/2017  . TETANUS/TDAP  04/29/2020  . COLONOSCOPY  09/15/2020  . Hepatitis C Screening  Completed  . HIV Screening  Completed    Lab Results  Component Value Date   WBC 5.5 02/14/2017   HGB 14.7 02/14/2017   HCT 44.1 02/14/2017   PLT 217.0 02/14/2017   GLUCOSE 93 08/04/2017   CHOL 234 (H) 02/14/2017   TRIG 201.0 (H) 02/14/2017   HDL 53.30 02/14/2017   LDLDIRECT 140.0 02/14/2017   LDLCALC 108 (H) 11/21/2014   ALT 30 08/04/2017   AST 21 08/04/2017   NA 139 08/04/2017   K 4.1 08/04/2017   CL 105 08/04/2017   CREATININE 0.92 08/04/2017   BUN 18 08/04/2017   CO2 28 08/04/2017   TSH 4.45 02/14/2017   PSA 0.50 11/21/2014    Lab Results  Component Value Date   TSH 4.45 02/14/2017   Lab Results  Component Value Date   WBC 5.5 02/14/2017   HGB 14.7 02/14/2017   HCT 44.1 02/14/2017   MCV 93.8 02/14/2017   PLT 217.0 02/14/2017   Lab Results  Component Value Date   NA 139 08/04/2017   K 4.1 08/04/2017   CO2 28 08/04/2017   GLUCOSE 93 08/04/2017   BUN 18 08/04/2017   CREATININE 0.92 08/04/2017   BILITOT 0.7 08/04/2017   ALKPHOS 64 08/04/2017   AST 21 08/04/2017   ALT 30 08/04/2017   PROT 7.3 08/04/2017   ALBUMIN 4.5 08/04/2017   CALCIUM 9.4 08/04/2017   ANIONGAP 13 03/15/2014   GFR 91.28 08/04/2017   Lab Results  Component Value Date   CHOL 234 (H) 02/14/2017   Lab Results  Component Value Date   HDL 53.30 02/14/2017   Lab Results  Component Value Date   LDLCALC 108  (H) 11/21/2014   Lab Results  Component Value Date   TRIG 201.0 (H) 02/14/2017   Lab Results  Component Value Date   CHOLHDL 4 02/14/2017   No results found for: HGBA1C       Assessment & Plan:   Problem List Items Addressed This Visit    Hyperlipidemia    Encouraged heart healthy diet, increase exercise, avoid trans fats, consider a krill oil cap daily      Low back pain    Continues to struggle with significant low back pain daily that limits his ability to manage his ADLs and has been unable to return to work. He has significant radiculopathy into his left foot with poor control of the foot and poor balance as a result. No recent falls. He is preparing for his consultation and is ready to proceed with the next step. He is managing without meds but is having to adjust his activity significantly. Spent 20 minutes of a 25 minutes visit in counseling and plan of care. Patient in need of documentation to access his FMLA at work. Provided.          We will stop administering sodium chloride.  No orders of the defined types were placed in this encounter.   CMA served as Education administrator during this visit. History, Physical and Plan performed by medical provider. Documentation and orders reviewed and attested to.  Penni Homans, MD

## 2017-10-20 NOTE — Patient Instructions (Signed)
Degenerative Disk Disease Degenerative disk disease is a condition caused by the changes that occur in spinal disks as you grow older. Spinal disks are soft and compressible disks located between the bones of your spine (vertebrae). These disks act like shock absorbers. Degenerative disk disease can affect the whole spine. However, the neck and lower back are most commonly affected. Many changes can occur in the spinal disks with aging, such as:  The spinal disks may dry and shrink.  Small tears may occur in the tough, outer covering of the disk (annulus).  The disk space may become smaller due to loss of water.  Abnormal growths in the bone (spurs) may occur. This can put pressure on the nerve roots exiting the spinal canal, causing pain.  The spinal canal may become narrowed.  What increases the risk?  Being overweight.  Having a family history of degenerative disk disease.  Smoking.  There is increased risk if you are doing heavy lifting or have a sudden injury. What are the signs or symptoms? Symptoms vary from person to person and may include:  Pain that varies in intensity. Some people have no pain, while others have severe pain. The location of the pain depends on the part of your backbone that is affected. ? You will have neck or arm pain if a disk in the neck area is affected. ? You will have pain in your back, buttocks, or legs if a disk in the lower back is affected.  Pain that becomes worse while bending, reaching up, or with twisting movements.  Pain that may start gradually and then get worse as time passes. It may also start after a major or minor injury.  Numbness or tingling in the arms or legs.  How is this diagnosed? Your health care provider will ask you about your symptoms and about activities or habits that may cause the pain. He or she may also ask about any injuries, diseases, or treatments you have had. Your health care provider will examine you to check  for the range of movement that is possible in the affected area, to check for strength in your extremities, and to check for sensation in the areas of the arms and legs supplied by different nerve roots. You may also have:  An X-ray of the spine.  Other imaging tests, such as MRI.  How is this treated? Your health care provider will advise you on the best plan for treatment. Treatment may include:  Medicines.  Rehabilitation exercises.  Follow these instructions at home:  Follow proper lifting and walking techniques as advised by your health care provider.  Maintain good posture.  Exercise regularly as advised by your health care provider.  Perform relaxation exercises.  Change your sitting, standing, and sleeping habits as advised by your health care provider.  Change positions frequently.  Lose weight or maintain a healthy weight as advised by your health care provider.  Do not use any tobacco products, including cigarettes, chewing tobacco, or electronic cigarettes. If you need help quitting, ask your health care provider.  Wear supportive footwear.  Take medicines only as directed by your health care provider. Contact a health care provider if:  Your pain does not go away within 1-4 weeks.  You have significant appetite or weight loss. Get help right away if:  Your pain is severe.  You notice weakness in your arms, hands, or legs.  You begin to lose control of your bladder or bowel movements.  You have   fevers or night sweats. This information is not intended to replace advice given to you by your health care provider. Make sure you discuss any questions you have with your health care provider. Document Released: 02/10/2007 Document Revised: 09/21/2015 Document Reviewed: 08/17/2013 Elsevier Interactive Patient Education  2018 [employer]vier Inc[employer]  

## 2017-10-26 NOTE — Assessment & Plan Note (Addendum)
Continues to struggle with significant low back pain daily that limits his ability to manage his ADLs and has been unable to return to work. He has significant radiculopathy into his left foot with poor control of the foot and poor balance as a result. No recent falls. He is preparing for his consultation and is ready to proceed with the next step. He is managing without meds but is having to adjust his activity significantly. Spent 20 minutes of a 25 minutes visit in counseling and plan of care. Patient in need of documentation to access his FMLA at work. Provided.

## 2017-10-26 NOTE — Assessment & Plan Note (Signed)
Encouraged heart healthy diet, increase exercise, avoid trans fats, consider a krill oil cap daily 

## 2017-12-18 ENCOUNTER — Other Ambulatory Visit: Payer: Self-pay | Admitting: Neurosurgery

## 2018-01-15 NOTE — Pre-Procedure Instructions (Signed)
Ronald Griffin  01/15/2018      [facility] [place], [address] [place] [address] Phone: [phone] Fax: [phone]    Your procedure is scheduled on February 02, 2018.  Report to [facility] Admitting at 830 AM.  Call this number if you have problems the morning of surgery:  [phone]   Remember:  Do not eat or drink after midnight.    Take these medicines the morning of surgery with A SIP OF WATER-NONE  7 days prior to surgery STOP taking any Aspirin (unless otherwise instructed by your surgeon), Aleve, Naproxen, Ibuprofen, Motrin, Advil, Goody's, BC's, all herbal medications, fish oil, and all vitamins    Do not wear jewelry  Do not wear lotions, powders, or colognes, or deodorant.  Men may shave face and neck.  Do not bring valuables to the hospital.  [facility] is not responsible for any belongings or valuables.  Contacts, dentures or bridgework may not be worn into surgery.  Leave your suitcase in the car.  After surgery it may be brought to your room.  For patients admitted to the hospital, discharge time will be determined by your treatment team.  Patients discharged the day of surgery will not be allowed to drive home.   [place]- Preparing For Surgery  Before surgery, you can play an important role. Because skin is not sterile, your skin needs to be as free of germs as possible. You can reduce the number of germs on your skin by washing with CHG (chlorahexidine gluconate) Soap before surgery.  CHG is an antiseptic cleaner which kills germs and bonds with the skin to continue killing germs even after washing.    Oral Hygiene is also important to reduce your risk of infection.  Remember - BRUSH YOUR TEETH THE MORNING OF SURGERY WITH YOUR REGULAR TOOTHPASTE  Please do not use if you have an allergy to CHG or antibacterial soaps. If your skin  becomes reddened/irritated stop using the CHG.  Do not shave (including legs and underarms) for at least 48 hours prior to first CHG shower. It is OK to shave your face.  Please follow these instructions carefully.   1. Shower the NIGHT BEFORE SURGERY and the MORNING OF SURGERY with CHG.   2. If you chose to wash your hair, wash your hair first as usual with your normal shampoo.  3. After you shampoo, rinse your hair and body thoroughly to remove the shampoo.  4. Use CHG as you would any other liquid soap. You can apply CHG directly to the skin and wash gently with a scrungie or a clean washcloth.   5. Apply the CHG Soap to your body ONLY FROM THE NECK DOWN.  Do not use on open wounds or open sores. Avoid contact with your eyes, ears, mouth and genitals (private parts). Wash Face and genitals (private parts)  with your normal soap.  6. Wash thoroughly, paying special attention to the area where your surgery will be performed.  7. Thoroughly rinse your body with warm water from the neck down.  8. DO NOT shower/wash with your normal soap after using and rinsing off the CHG Soap.  9. Pat yourself dry with a CLEAN TOWEL.  10. Wear CLEAN PAJAMAS to bed the night before surgery, wear comfortable clothes the morning of surgery  11. Place CLEAN SHEETS on your bed  the night of your first shower and DO NOT SLEEP WITH PETS.  Day of Surgery:  Do not apply any deodorants/lotions.  Please wear clean clothes to the hospital/surgery center.   Remember to brush your teeth WITH YOUR REGULAR TOOTHPASTE.   Please read over the following fact sheets that you were given.

## 2018-01-16 ENCOUNTER — Encounter (HOSPITAL_COMMUNITY)
Admission: RE | Admit: 2018-01-16 | Discharge: 2018-01-16 | Disposition: A | Discharge status: Home / Self Care | Payer: Self-pay | Source: Ambulatory Visit | Attending: Neurosurgery | Admitting: Neurosurgery

## 2018-01-16 ENCOUNTER — Encounter (HOSPITAL_COMMUNITY): Payer: Self-pay

## 2018-01-16 ENCOUNTER — Other Ambulatory Visit (HOSPITAL_COMMUNITY): Payer: Self-pay

## 2018-01-16 DIAGNOSIS — Z01812 Encounter for preprocedural laboratory examination: Secondary | ICD-10-CM | POA: Insufficient documentation

## 2018-01-16 LAB — BASIC METABOLIC PANEL
Anion gap: 9 (ref 5–15)
BUN: 16 mg/dL (ref 6–20)
CALCIUM: 9.3 mg/dL (ref 8.9–10.3)
CO2: 22 mmol/L (ref 22–32)
Chloride: 107 mmol/L (ref 98–111)
Creatinine, Ser: 0.87 mg/dL (ref 0.61–1.24)
Glucose, Bld: 119 mg/dL — ABNORMAL HIGH (ref 70–99)
POTASSIUM: 4.1 mmol/L (ref 3.5–5.1)
SODIUM: 138 mmol/L (ref 135–145)

## 2018-01-16 LAB — CBC WITH DIFFERENTIAL/PLATELET
ABS IMMATURE GRANULOCYTES: 0 10*3/uL (ref 0.0–0.1)
BASOS ABS: 0.1 10*3/uL (ref 0.0–0.1)
BASOS PCT: 1 %
Eosinophils Absolute: 0.3 10*3/uL (ref 0.0–0.7)
Eosinophils Relative: 4 %
HCT: 44.7 % (ref 39.0–52.0)
Hemoglobin: 14.8 g/dL (ref 13.0–17.0)
IMMATURE GRANULOCYTES: 0 %
Lymphocytes Relative: 24 %
Lymphs Abs: 1.4 10*3/uL (ref 0.7–4.0)
MCH: 30.6 pg (ref 26.0–34.0)
MCHC: 33.1 g/dL (ref 30.0–36.0)
MCV: 92.4 fL (ref 78.0–100.0)
MONO ABS: 0.6 10*3/uL (ref 0.1–1.0)
Monocytes Relative: 10 %
NEUTROS ABS: 3.6 10*3/uL (ref 1.7–7.7)
NEUTROS PCT: 61 %
PLATELETS: 217 10*3/uL (ref 150–400)
RBC: 4.84 MIL/uL (ref 4.22–5.81)
RDW: 13.1 % (ref 11.5–15.5)
WBC: 5.9 10*3/uL (ref 4.0–10.5)

## 2018-01-16 LAB — SURGICAL PCR SCREEN
MRSA, PCR: NEGATIVE
STAPHYLOCOCCUS AUREUS: NEGATIVE

## 2018-01-16 MED ORDER — CHLORHEXIDINE GLUCONATE CLOTH 2 % EX PADS: 6.0000 | MEDICATED_PAD | Freq: Once | CUTANEOUS | Status: DC

## 2018-01-19 ENCOUNTER — Ambulatory Visit: Payer: 59 | Admitting: Family Medicine

## 2018-01-19 ENCOUNTER — Other Ambulatory Visit (HOSPITAL_COMMUNITY): Payer: Self-pay

## 2018-01-27 ENCOUNTER — Telehealth: Payer: Self-pay | Admitting: Family Medicine

## 2018-01-27 NOTE — Telephone Encounter (Signed)
Copied from [place] [id number]. Topic: Quick Communication - See Telephone Encounter >> Jan 27, 2018 11:19 AM Rosalin Hawking wrote: CRM for notification. See Telephone encounter for: 01/27/18.   LVM for pt to schedule an appt with provider due to pt is needing documents to be filled out. Pt would need 30 min appt to filled out documents.

## 2018-01-30 ENCOUNTER — Other Ambulatory Visit: Payer: Self-pay

## 2018-01-30 ENCOUNTER — Encounter (HOSPITAL_COMMUNITY): Payer: Self-pay | Admitting: *Deleted

## 2018-01-30 NOTE — Progress Notes (Signed)
Pt denies SOB, chest pain, and being under the care of a cardiologist. Pt denies having a stress test, echo and cardiac cath. Pt denies having an EKG and chest x ray within the last year. Pt made aware to stop taking  Aspirin, vitamins, fish oil and herbal medications. Do not take any NSAIDs ie: Ibuprofen, Advil, Naproxen (Aleve), Motrin, BC and Goody Powder. Pt verbalized understanding of all pre-op instructions.

## 2018-02-06 ENCOUNTER — Other Ambulatory Visit: Payer: Self-pay

## 2018-02-06 ENCOUNTER — Encounter (HOSPITAL_COMMUNITY): Payer: Self-pay | Admitting: *Deleted

## 2018-02-06 NOTE — Progress Notes (Signed)
Pt had a PAT appt on 01/16/18 for this surgery, surgery date was moved to 02/02/18 - he had a pre-op call done on the 4th and now surgery has been moved to the 14th. Pt states nothing has changed with his allergies, medications, medical and surgical history. Pt has his instructions from his original PAT appt and will follow those. Pt given time of arrival of 5:30 AM.

## 2018-02-08 NOTE — Anesthesia Preprocedure Evaluation (Addendum)
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    GI/Hepatic negative GI ROS, Neg liver ROS,   Endo/Other  negative endocrine ROS  Renal/GU negative Renal ROS     Musculoskeletal   Abdominal   Peds  Hematology negative hematology ROS (+)   Anesthesia Other Findings   Reproductive/Obstetrics                            Lab Results  Component Value Date   WBC 5.9 01/16/2018   HGB 14.8 01/16/2018   HCT 44.7 01/16/2018   MCV 92.4 01/16/2018   PLT 217 01/16/2018   Lab Results  Component Value Date   CREATININE 0.87 01/16/2018   BUN 16 01/16/2018   NA 138 01/16/2018   K 4.1 01/16/2018   CL 107 01/16/2018   CO2 22 01/16/2018    Anesthesia Physical Anesthesia Plan  ASA: II  Anesthesia Plan: General   Post-op Pain Management:    Induction: Intravenous  PONV Risk Score and Plan: 3 and Dexamethasone, Ondansetron, Treatment may vary due to age or medical condition, Scopolamine patch - Pre-op and Propofol infusion  Airway Management Planned: Oral ETT  Additional Equipment:   Intra-op Plan:   Post-operative Plan: Extubation in OR  Informed Consent: I have reviewed the patients History and Physical, chart, labs and discussed the procedure including the risks, benefits and alternatives for the proposed anesthesia with the patient or authorized representative who has indicated his/her understanding and  acceptance.   Dental advisory given  Plan Discussed with: CRNA  Anesthesia Plan Comments:        Anesthesia Quick Evaluation

## 2018-02-09 ENCOUNTER — Ambulatory Visit (HOSPITAL_COMMUNITY): Payer: 59 | Admitting: Anesthesiology

## 2018-02-09 ENCOUNTER — Encounter (HOSPITAL_COMMUNITY): Payer: Self-pay | Admitting: Certified Registered"

## 2018-02-09 ENCOUNTER — Encounter (HOSPITAL_COMMUNITY)
Admission: RE | Disposition: A | Discharge status: Home / Self Care | Payer: Self-pay | Source: Ambulatory Visit | Attending: Neurosurgery

## 2018-02-09 ENCOUNTER — Ambulatory Visit (HOSPITAL_COMMUNITY): Payer: 59

## 2018-02-09 ENCOUNTER — Observation Stay (HOSPITAL_COMMUNITY)
Admission: RE | Admit: 2018-02-09 | Discharge: 2018-02-10 | Disposition: A | Discharge status: Home / Self Care | Payer: 59 | Source: Ambulatory Visit | Attending: Neurosurgery | Admitting: Neurosurgery

## 2018-02-09 ENCOUNTER — Other Ambulatory Visit: Payer: Self-pay

## 2018-02-09 DIAGNOSIS — Z79899 Other long term (current) drug therapy: Secondary | ICD-10-CM | POA: Insufficient documentation

## 2018-02-09 DIAGNOSIS — M5104 Intervertebral disc disorders with myelopathy, thoracic region: Principal | ICD-10-CM | POA: Insufficient documentation

## 2018-02-09 DIAGNOSIS — Z419 Encounter for procedure for purposes other than remedying health state, unspecified: Secondary | ICD-10-CM

## 2018-02-09 DIAGNOSIS — E785 Hyperlipidemia, unspecified: Secondary | ICD-10-CM | POA: Insufficient documentation

## 2018-02-09 DIAGNOSIS — M546 Pain in thoracic spine: Secondary | ICD-10-CM | POA: Insufficient documentation

## 2018-02-09 HISTORY — DX: Disease of spinal cord, unspecified: G95.9

## 2018-02-09 HISTORY — PX: THORACIC DISCECTOMY: SHX6113

## 2018-02-09 LAB — BASIC METABOLIC PANEL
ANION GAP: 7 (ref 5–15)
BUN: 16 mg/dL (ref 6–20)
CHLORIDE: 108 mmol/L (ref 98–111)
CO2: 24 mmol/L (ref 22–32)
Calcium: 8.7 mg/dL — ABNORMAL LOW (ref 8.9–10.3)
Creatinine, Ser: 1 mg/dL (ref 0.61–1.24)
GFR calc non Af Amer: >60 mL/min (ref >60)
Glucose, Bld: 105 mg/dL — ABNORMAL HIGH (ref 70–99)
POTASSIUM: 3.7 mmol/L (ref 3.5–5.1)
SODIUM: 139 mmol/L (ref 135–145)

## 2018-02-09 LAB — CBC
HEMATOCRIT: 42.1 % (ref 39.0–52.0)
Hemoglobin: 13.8 g/dL (ref 13.0–17.0)
MCH: 30.2 pg (ref 26.0–34.0)
MCHC: 32.8 g/dL (ref 30.0–36.0)
MCV: 92.1 fL (ref 80.0–100.0)
Platelets: 222 10*3/uL (ref 150–400)
RBC: 4.57 MIL/uL (ref 4.22–5.81)
RDW: 12.7 % (ref 11.5–15.5)
WBC: 5.4 10*3/uL (ref 4.0–10.5)
nRBC: 0 % (ref 0.0–0.2)

## 2018-02-09 SURGERY — THORACIC DISCECTOMY
Anesthesia: General | Site: Back | Laterality: Left

## 2018-02-09 MED ORDER — SODIUM CHLORIDE 0.9% FLUSH
3.0000 mL | Freq: Two times a day (BID) | INTRAVENOUS | Status: DC
  Administered 2018-02-09: 3 mL via INTRAVENOUS

## 2018-02-09 MED ORDER — LACTATED RINGERS IV SOLN: Freq: Once | INTRAVENOUS | Status: DC

## 2018-02-09 MED ORDER — THROMBIN 5000 UNITS EX SOLR
CUTANEOUS | Status: AC
  Filled 2018-02-09: qty 5000

## 2018-02-09 MED ORDER — PROPOFOL 10 MG/ML IV BOLUS
INTRAVENOUS | Status: DC | PRN
  Administered 2018-02-09: 200 mg via INTRAVENOUS

## 2018-02-09 MED ORDER — FENTANYL CITRATE (PF) 100 MCG/2ML IJ SOLN
INTRAMUSCULAR | Status: AC
  Filled 2018-02-09: qty 2

## 2018-02-09 MED ORDER — THROMBIN 5000 UNITS EX SOLR
CUTANEOUS | Status: AC
  Filled 2018-02-09: qty 10000

## 2018-02-09 MED ORDER — LACTATED RINGERS IV SOLN
INTRAVENOUS | Status: DC | PRN
  Administered 2018-02-09 (×2): via INTRAVENOUS

## 2018-02-09 MED ORDER — LIDOCAINE 2% (20 MG/ML) 5 ML SYRINGE
INTRAMUSCULAR | Status: DC | PRN
  Administered 2018-02-09: 100 mg via INTRAVENOUS

## 2018-02-09 MED ORDER — FENTANYL CITRATE (PF) 100 MCG/2ML IJ SOLN
25.0000 ug | INTRAMUSCULAR | Status: DC | PRN
  Administered 2018-02-09 (×2): 50 ug via INTRAVENOUS

## 2018-02-09 MED ORDER — ONDANSETRON HCL 4 MG/2ML IJ SOLN
INTRAMUSCULAR | Status: DC | PRN
  Administered 2018-02-09: 4 mg via INTRAVENOUS

## 2018-02-09 MED ORDER — ONDANSETRON HCL 4 MG/2ML IJ SOLN: 4.0000 mg | Freq: Once | INTRAMUSCULAR | Status: DC | PRN

## 2018-02-09 MED ORDER — KETOROLAC TROMETHAMINE 15 MG/ML IJ SOLN
30.0000 mg | Freq: Four times a day (QID) | INTRAMUSCULAR | Status: DC
  Administered 2018-02-09: 30 mg via INTRAVENOUS
  Filled 2018-02-09: qty 2

## 2018-02-09 MED ORDER — ACETAMINOPHEN 325 MG PO TABS: 650.0000 mg | ORAL_TABLET | ORAL | Status: DC | PRN

## 2018-02-09 MED ORDER — MIDAZOLAM HCL 2 MG/2ML IJ SOLN
INTRAMUSCULAR | Status: AC
  Filled 2018-02-09: qty 2

## 2018-02-09 MED ORDER — PHENOL 1.4 % MT LIQD: 1.0000 | OROMUCOSAL | Status: DC | PRN

## 2018-02-09 MED ORDER — THROMBIN 5000 UNITS EX SOLR
CUTANEOUS | Status: DC | PRN
  Administered 2018-02-09 (×2): 5000 [IU] via TOPICAL

## 2018-02-09 MED ORDER — SODIUM CHLORIDE 0.9 % IV SOLN
INTRAVENOUS | Status: DC | PRN
  Administered 2018-02-09: 500 mL

## 2018-02-09 MED ORDER — PROPOFOL 500 MG/50ML IV EMUL
INTRAVENOUS | Status: DC | PRN
  Administered 2018-02-09: 25 ug/kg/min via INTRAVENOUS

## 2018-02-09 MED ORDER — ONDANSETRON HCL 4 MG/2ML IJ SOLN
4.0000 mg | Freq: Four times a day (QID) | INTRAMUSCULAR | Status: DC | PRN
  Administered 2018-02-09: 4 mg via INTRAVENOUS
  Filled 2018-02-09: qty 2

## 2018-02-09 MED ORDER — BUPIVACAINE HCL (PF) 0.25 % IJ SOLN
INTRAMUSCULAR | Status: DC | PRN
  Administered 2018-02-09: 19 mL

## 2018-02-09 MED ORDER — HYDROCODONE-ACETAMINOPHEN 10-325 MG PO TABS
1.0000 | ORAL_TABLET | ORAL | Status: DC | PRN
  Administered 2018-02-09 – 2018-02-10 (×5): 1 via ORAL
  Filled 2018-02-09 (×5): qty 1

## 2018-02-09 MED ORDER — CEFAZOLIN SODIUM-DEXTROSE 1-4 GM/50ML-% IV SOLN
1.0000 g | Freq: Three times a day (TID) | INTRAVENOUS | Status: AC
  Administered 2018-02-09 (×2): 1 g via INTRAVENOUS
  Filled 2018-02-09 (×2): qty 50

## 2018-02-09 MED ORDER — PROMETHAZINE HCL 25 MG/ML IJ SOLN: 6.2500 mg | INTRAMUSCULAR | Status: DC | PRN

## 2018-02-09 MED ORDER — HEMOSTATIC AGENTS (NO CHARGE) OPTIME
TOPICAL | Status: DC | PRN
  Administered 2018-02-09: 1

## 2018-02-09 MED ORDER — CYCLOBENZAPRINE HCL 10 MG PO TABS: 10.0000 mg | ORAL_TABLET | Freq: Three times a day (TID) | ORAL | Status: DC | PRN

## 2018-02-09 MED ORDER — KETOROLAC TROMETHAMINE 30 MG/ML IJ SOLN
30.0000 mg | Freq: Four times a day (QID) | INTRAMUSCULAR | Status: DC
  Administered 2018-02-09 – 2018-02-10 (×2): 30 mg via INTRAVENOUS
  Filled 2018-02-09 (×2): qty 1

## 2018-02-09 MED ORDER — ROCURONIUM BROMIDE 10 MG/ML (PF) SYRINGE
PREFILLED_SYRINGE | INTRAVENOUS | Status: DC | PRN
  Administered 2018-02-09: 50 mg via INTRAVENOUS
  Administered 2018-02-09: 30 mg via INTRAVENOUS
  Administered 2018-02-09: 20 mg via INTRAVENOUS

## 2018-02-09 MED ORDER — HYDROCODONE-ACETAMINOPHEN 10-325 MG PO TABS: 2.0000 | ORAL_TABLET | ORAL | Status: DC | PRN

## 2018-02-09 MED ORDER — PROPOFOL 10 MG/ML IV BOLUS
INTRAVENOUS | Status: AC
  Filled 2018-02-09: qty 20

## 2018-02-09 MED ORDER — SUGAMMADEX SODIUM 200 MG/2ML IV SOLN
INTRAVENOUS | Status: DC | PRN
  Administered 2018-02-09: 200 mg via INTRAVENOUS

## 2018-02-09 MED ORDER — HYDROMORPHONE HCL 1 MG/ML IJ SOLN
1.0000 mg | INTRAMUSCULAR | Status: DC | PRN
  Administered 2018-02-09: 1 mg via INTRAVENOUS
  Filled 2018-02-09: qty 1

## 2018-02-09 MED ORDER — THROMBIN 5000 UNITS EX SOLR
OROMUCOSAL | Status: DC | PRN
  Administered 2018-02-09: 09:00:00 via TOPICAL

## 2018-02-09 MED ORDER — ACETAMINOPHEN 650 MG RE SUPP: 650.0000 mg | RECTAL | Status: DC | PRN

## 2018-02-09 MED ORDER — BUPIVACAINE HCL (PF) 0.25 % IJ SOLN
INTRAMUSCULAR | Status: AC
  Filled 2018-02-09: qty 30

## 2018-02-09 MED ORDER — MENTHOL 3 MG MT LOZG: 1.0000 | LOZENGE | OROMUCOSAL | Status: DC | PRN

## 2018-02-09 MED ORDER — FENTANYL CITRATE (PF) 250 MCG/5ML IJ SOLN
INTRAMUSCULAR | Status: AC
  Filled 2018-02-09: qty 5

## 2018-02-09 MED ORDER — MIDAZOLAM HCL 5 MG/5ML IJ SOLN
INTRAMUSCULAR | Status: DC | PRN
  Administered 2018-02-09: 2 mg via INTRAVENOUS

## 2018-02-09 MED ORDER — FENTANYL CITRATE (PF) 250 MCG/5ML IJ SOLN
INTRAMUSCULAR | Status: DC | PRN
  Administered 2018-02-09 (×2): 50 ug via INTRAVENOUS
  Administered 2018-02-09: 100 ug via INTRAVENOUS
  Administered 2018-02-09: 50 ug via INTRAVENOUS

## 2018-02-09 MED ORDER — HYDROCODONE-ACETAMINOPHEN 5-325 MG PO TABS: 1.0000 | ORAL_TABLET | ORAL | Status: DC | PRN

## 2018-02-09 MED ORDER — CEFAZOLIN SODIUM-DEXTROSE 2-4 GM/100ML-% IV SOLN
2.0000 g | INTRAVENOUS | Status: AC
  Administered 2018-02-09: 2 g via INTRAVENOUS
  Filled 2018-02-09: qty 100

## 2018-02-09 MED ORDER — SODIUM CHLORIDE 0.9% FLUSH: 3.0000 mL | INTRAVENOUS | Status: DC | PRN

## 2018-02-09 MED ORDER — KETOROLAC TROMETHAMINE 30 MG/ML IJ SOLN
INTRAMUSCULAR | Status: DC | PRN
  Administered 2018-02-09: 30 mg via INTRAVENOUS

## 2018-02-09 MED ORDER — DEXAMETHASONE SODIUM PHOSPHATE 10 MG/ML IJ SOLN
10.0000 mg | INTRAMUSCULAR | Status: AC
  Administered 2018-02-09: 10 mg via INTRAVENOUS
  Filled 2018-02-09: qty 1

## 2018-02-09 MED ORDER — SCOPOLAMINE 1 MG/3DAYS TD PT72
MEDICATED_PATCH | TRANSDERMAL | Status: AC
  Filled 2018-02-09: qty 1

## 2018-02-09 MED ORDER — ONDANSETRON HCL 4 MG PO TABS: 4.0000 mg | ORAL_TABLET | Freq: Four times a day (QID) | ORAL | Status: DC | PRN

## 2018-02-09 MED ORDER — SODIUM CHLORIDE 0.9 % IV SOLN: 250.0000 mL | INTRAVENOUS | Status: DC

## 2018-02-09 MED ORDER — SCOPOLAMINE 1 MG/3DAYS TD PT72
MEDICATED_PATCH | TRANSDERMAL | Status: DC | PRN
  Administered 2018-02-09: 1 via TRANSDERMAL

## 2018-02-09 SURGICAL SUPPLY — 47 items
BAG DECANTER FOR FLEXI CONT (MISCELLANEOUS) ×3 IMPLANT
BENZOIN TINCTURE PRP APPL 2/3 (GAUZE/BANDAGES/DRESSINGS) ×3 IMPLANT
BUR CUTTER 7.0 ROUND (BURR) ×3 IMPLANT
BUR MATCHSTICK NEURO 3.0 LAGG (BURR) ×3 IMPLANT
CANISTER SUCT 3000ML PPV (MISCELLANEOUS) ×3 IMPLANT
CARTRIDGE OIL MAESTRO DRILL (MISCELLANEOUS) ×1 IMPLANT
CLOSURE WOUND 1/2 X4 (GAUZE/BANDAGES/DRESSINGS) ×1
COVER WAND RF STERILE (DRAPES) ×3 IMPLANT
DECANTER SPIKE VIAL GLASS SM (MISCELLANEOUS) ×3 IMPLANT
DERMABOND ADVANCED (GAUZE/BANDAGES/DRESSINGS) ×2
DERMABOND ADVANCED .7 DNX12 (GAUZE/BANDAGES/DRESSINGS) ×1 IMPLANT
DIFFUSER DRILL AIR PNEUMATIC (MISCELLANEOUS) ×3 IMPLANT
DRAPE HALF SHEET 40X57 (DRAPES) IMPLANT
DRAPE LAPAROTOMY 100X72X124 (DRAPES) ×3 IMPLANT
DRAPE MICROSCOPE LEICA (MISCELLANEOUS) ×3 IMPLANT
DRAPE POUCH INSTRU U-SHP 10X18 (DRAPES) ×3 IMPLANT
DRAPE SURG 17X23 STRL (DRAPES) ×12 IMPLANT
DRSG OPSITE POSTOP 4X6 (GAUZE/BANDAGES/DRESSINGS) ×3 IMPLANT
ELECT REM PT RETURN 9FT ADLT (ELECTROSURGICAL) ×3
ELECTRODE REM PT RTRN 9FT ADLT (ELECTROSURGICAL) ×1 IMPLANT
GAUZE 4X4 16PLY RFD (DISPOSABLE) IMPLANT
GAUZE SPONGE 4X4 12PLY STRL (GAUZE/BANDAGES/DRESSINGS) ×3 IMPLANT
GLOVE ECLIPSE 9.0 STRL (GLOVE) ×3 IMPLANT
GLOVE EXAM NITRILE LRG STRL (GLOVE) IMPLANT
GLOVE EXAM NITRILE XL STR (GLOVE) IMPLANT
GLOVE EXAM NITRILE XS STR PU (GLOVE) IMPLANT
GOWN STRL REUS W/ TWL LRG LVL3 (GOWN DISPOSABLE) IMPLANT
GOWN STRL REUS W/ TWL XL LVL3 (GOWN DISPOSABLE) ×1 IMPLANT
GOWN STRL REUS W/TWL 2XL LVL3 (GOWN DISPOSABLE) IMPLANT
GOWN STRL REUS W/TWL LRG LVL3 (GOWN DISPOSABLE)
GOWN STRL REUS W/TWL XL LVL3 (GOWN DISPOSABLE) ×2
KIT BASIN OR (CUSTOM PROCEDURE TRAY) ×3 IMPLANT
KIT TURNOVER KIT B (KITS) ×3 IMPLANT
NEEDLE HYPO 22GX1.5 SAFETY (NEEDLE) ×3 IMPLANT
NEEDLE SPNL 22GX3.5 QUINCKE BK (NEEDLE) ×3 IMPLANT
NS IRRIG 1000ML POUR BTL (IV SOLUTION) ×3 IMPLANT
OIL CARTRIDGE MAESTRO DRILL (MISCELLANEOUS) ×3
PACK LAMINECTOMY NEURO (CUSTOM PROCEDURE TRAY) ×3 IMPLANT
PAD ARMBOARD 7.5X6 YLW CONV (MISCELLANEOUS) ×9 IMPLANT
RUBBERBAND STERILE (MISCELLANEOUS) ×6 IMPLANT
SPONGE SURGIFOAM ABS GEL SZ50 (HEMOSTASIS) IMPLANT
STRIP CLOSURE SKIN 1/2X4 (GAUZE/BANDAGES/DRESSINGS) ×2 IMPLANT
SUT VIC AB 2-0 CT1 18 (SUTURE) ×3 IMPLANT
SUT VIC AB 3-0 SH 8-18 (SUTURE) ×3 IMPLANT
TOWEL GREEN STERILE (TOWEL DISPOSABLE) ×3 IMPLANT
TOWEL GREEN STERILE FF (TOWEL DISPOSABLE) ×3 IMPLANT
WATER STERILE IRR 1000ML POUR (IV SOLUTION) ×3 IMPLANT

## 2018-02-09 NOTE — Anesthesia Postprocedure Evaluation (Signed)
Anesthesia Post Note  Patient: Ronald Griffin  Procedure(s) Performed: Microdiscectomy - Left - Thoracic Seven-Thoracid Eight - Thoracic Eight - Thoracic Nine (Left Back)     Patient location during evaluation: PACU Anesthesia Type: General Level of consciousness: awake and alert Pain management: pain level controlled Vital Signs Assessment: post-procedure vital signs reviewed and stable Respiratory status: spontaneous breathing, nonlabored ventilation, respiratory function stable and patient connected to nasal cannula oxygen Cardiovascular status: blood pressure returned to baseline and stable Postop Assessment: no apparent nausea or vomiting Anesthetic complications: no    Last Vitals:  Vitals:   02/09/18 1100 02/09/18 1125  BP: 121/68 132/77  Pulse: (!) 53 (!) 59  Resp: 12 18  Temp: 36.4 C 36.6 C  SpO2: 100% 95%    Last Pain:  Vitals:   02/09/18 1200  TempSrc:   PainSc: 7                  Tiajuana Amass

## 2018-02-09 NOTE — Transfer of Care (Signed)
Immediate Anesthesia Transfer of Care Note  Patient: Ronald Griffin  Procedure(s) Performed: Microdiscectomy - Left - Thoracic Seven-Thoracid Eight - Thoracic Eight - Thoracic Nine (Left Back)  Patient Location: PACU  Anesthesia Type:General  Level of Consciousness: awake and alert   Airway & Oxygen Therapy: Patient Spontanous Breathing and Patient connected to nasal cannula oxygen  Post-op Assessment: Report given to RN and Post -op Vital signs reviewed and stable  Post vital signs: Reviewed and stable  Last Vitals:  Vitals Value Taken Time  BP    Temp    Pulse    Resp    SpO2      Last Pain:  Vitals:   02/09/18 0643  TempSrc: Oral  PainSc:       Patients Stated Pain Goal: 3 (51/70/01 7494)  Complications: No apparent anesthesia complications

## 2018-02-09 NOTE — H&P (Signed)
Ronald Griffin is an 52 y.o. male.   Chief Complaint: Back pain HPI: 52 year old male with lower thoracic back pain and radiation into his left chest wall with some vague lower extremity symptoms.  Work-up demonstrates evidence of a left paracentral disc herniation at T7-T8 with significant cord compression and D formation of the cord itself.  At T8-9 on the left side the patient has evidence of spondylosis with moderate stenosis and some high signal within the posterior aspect of the spinal cord.  Remainder of his thoracic spine appears healthy.  Patient is failed conservative management.  He presents now for 2 level thoracic microdiscectomy in hopes of improving his symptoms.  Past Medical History:  Diagnosis Date  . Dizziness   . Elevated antinuclear antibody (ANA) level 11/21/2014  . Erectile dysfunction 11/21/2014  . Frequent headaches   . Hand pain, right 02/16/2017  . Headache 03/29/2014  . History of colonic polyps 11/21/2014  . Hyperlipidemia   . Knee pain, right 11/21/2014  . Low back pain 04/03/2015  . Motion sickness 11/21/2014  . Myelopathy ([place])    thoracic  . Obesity 02/16/2017  . Post-operative nausea and vomiting   . Preventative health care 11/21/2014    Past Surgical History:  Procedure Laterality Date  . COLONOSCOPY  2012   in [place]    . VARICOCELE EXCISION    . VASECTOMY      Family History  Problem Relation Age of Onset  . Lung cancer Mother   . Breast cancer Mother   . Cancer Mother        breast age 50s, colon late 3s, lung, liver, skin  . Colon cancer Mother 27  . Bipolar disorder Father   . Cancer Father 80       colon cancer  . Dementia Father   . Colon cancer Father 60  . Cancer Maternal Grandmother        ?  Marland Kitchen Heart disease Maternal Grandfather        MI, sudden  . Stroke Paternal Grandmother 82  . Bipolar disorder Paternal Grandfather   . Dementia Paternal Grandfather   . Cancer Paternal Grandfather        colon  . Colon  cancer Paternal Grandfather   . Obesity Brother   . Colon cancer Cousin 82  . Esophageal cancer Neg Hx   . Stomach cancer Neg Hx    Social History:  reports that he has never smoked. He has never used smokeless tobacco. He reports that he drinks about 3.0 - 5.0 standard drinks of alcohol per week. He reports that he does not use drugs.  Allergies: No Known Allergies  No medications prior to admission.    Results for orders placed or performed during the hospital encounter of 02/09/18 (from the past 48 hour(s))  CBC     Status: None   Collection Time: 02/09/18  6:47 AM  Result Value Ref Range   WBC 5.4 4.0 - 10.5 K/uL   RBC 4.57 4.22 - 5.81 MIL/uL   Hemoglobin 13.8 13.0 - 17.0 g/dL   HCT 42.1 39.0 - 52.0 %   MCV 92.1 80.0 - 100.0 fL   MCH 30.2 26.0 - 34.0 pg   MCHC 32.8 30.0 - 36.0 g/dL   RDW 12.7 11.5 - 15.5 %   Platelets 222 150 - 400 K/uL   nRBC 0.0 0.0 - 0.2 %    Comment: Performed at [facility], [place]  [address] [place], [place] [address]  Basic metabolic panel     Status: Abnormal   Collection Time: 02/09/18  6:47 AM  Result Value Ref Range   Sodium 139 135 - 145 mmol/L   Potassium 3.7 3.5 - 5.1 mmol/L   Chloride 108 98 - 111 mmol/L   CO2 24 22 - 32 mmol/L   Glucose, Bld 105 (H) 70 - 99 mg/dL   BUN 16 6 - 20 mg/dL   Creatinine, Ser 1.00 0.61 - 1.24 mg/dL   Calcium 8.7 (L) 8.9 - 10.3 mg/dL   GFR calc non Af Amer >60 >60 mL/min   GFR calc Af Amer >60 >60 mL/min    Comment: (NOTE) The eGFR has been calculated using the CKD EPI equation. This calculation has not been validated in all clinical situations. eGFR's persistently <60 mL/min signify possible Chronic Kidney Disease.    Anion gap 7 5 - 15    Comment: Performed at [facility] [address], [place],  [address]   No results found.  Pertinent items noted in HPI and remainder of comprehensive ROS otherwise negative.  Blood pressure 132/75, pulse 64, temperature 98.4 F (36.9 C),  temperature source Oral, resp. rate 20, height 5' 10.5" (1.791 m), weight 110.5 kg, SpO2 95 %.  Patient is awake and alert.  He is oriented and appropriate.  Cranial nerve function is intact.  Speech is fluent.  Judgment and insight are intact.  Upper extremity strength is normal bilateral.  Lower extremity strength with some very mild spastic weakness in his left lower extremity.  Sensory examination with a relative sensory level on the left around T8.  Reflexes are hyperactive in his lower extremities.  Toes are upgoing to plantar stimulation.  Gait is mildly spastic.  Examination head ears eyes nose throat is unremarkable her chest and abdomen are benign.  Extremities are free from injury deformity. 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  Plan left T7-8, left T8-9 laminotomy and transpedicular microdiscectomy's.  Risks and benefits of been explained.  Patient wishes to proceed.  Mallie Mussel A  02/09/2018, 7:53 AM

## 2018-02-09 NOTE — Op Note (Signed)
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  Postoperative diagnosis: Same  Procedure Name: Left T7-8 and left T8-9 laminotomy and transpedicular microdiscectomy's  Surgeon:Labresha Mellor A.Halford Goetzke, M.D.  Asst. Surgeon: None  Anesthesia: General  Indication: 52 year old male with lower thoracic pain and left lower extremity numbness paresthesias and spastic weakness.  Work-up demonstrates evidence of significant disc degeneration with disc herniations on the left side at T7-8 and T8-9 with significant cord compression and cord D formation with some high signal abnormality within the dorsal cord at T8-9.  Patient presents now for laminotomy and transpedicular microdiscectomy is in hopes of improving his symptoms.  Operative note: After induction of anesthesia, patient position prone onto Wilson frame and properly padded.  Thoracic region prepped and draped sterilely.  Incision made overlying T8.  Dissection performed on the left.  Retractor placed.  X-ray taken.  Levels confirmed.  Laminotomy then performed first at T8-9 using high-speed drill and Kerrison Rogers to remove the inferior aspect lamina of T8 the medial aspect of T8-9 facet joint and the superior rim of the T9 lamina.  Ligament flavum elevated and resected.  Procedure was then repeated at T7-8.  Microscope to run field and starting first at T7-8 the pedicle of T8 was partially removed with the superior medial aspect pedicle resected by the drill.  This then entered the posterior vertebral body of T8 and then subsequently the disc space of T7-8.  Disc space then incised 15 blade.  Disc herniation identified.  Disc herniation was then dissected free and pushed downward into the disc space where it was then removed using micro pituitaries.  Almost the disc herniation were completely resected.  All loose or of degenerative disc  material was removed in the interspace.  At this point there is no evidence of injury to the thecal sac nerve roots or spinal cord.  There is no evidence of any residual compression.  Procedure was then repeated in a similar fashion at T8-9 again without complications.  Wound was then irrigated fanlike solution.  Gelfoam was placed topically for hemostasis of both levels.  Wound is then closed in layers with Vicryl sutures.  Steri-Strips and sterile dressing were applied.  No apparent complications.  Patient tolerated the procedure well and he returns to the recovery room postop.

## 2018-02-09 NOTE — Anesthesia Procedure Notes (Signed)
Procedure Name: Intubation Date/Time: 02/09/2018 8:11 AM Performed by: Imagene Riches, CRNA Pre-anesthesia Checklist: Patient identified, Emergency Drugs available, Suction available and Patient being monitored Patient Re-evaluated:Patient Re-evaluated prior to induction Oxygen Delivery Method: Circle System Utilized Preoxygenation: Pre-oxygenation with 100% oxygen Induction Type: IV induction Ventilation: Mask ventilation without difficulty Laryngoscope Size: Miller and 3 Grade View: Grade II Tube type: Oral Tube size: 7.5 mm Number of attempts: 1 Airway Equipment and Method: Stylet and Oral airway Placement Confirmation: ETT inserted through vocal cords under direct vision,  positive ETCO2 and breath sounds checked- equal and bilateral Secured at: 23 cm Tube secured with: Tape Dental Injury: Teeth and Oropharynx as per pre-operative assessment

## 2018-02-09 NOTE — Brief Op Note (Signed)
02/09/2018  9:48 AM  PATIENT:  Ronald Griffin  52 y.o. male  PRE-OPERATIVE DIAGNOSIS:  Myelopathy  POST-OPERATIVE DIAGNOSIS:  Myelopathy  PROCEDURE:  Procedure(s) with comments: Microdiscectomy - Left - Thoracic Seven-Thoracid Eight - Thoracic Eight - Thoracic Nine (Left) - Microdiscectomy - Left - Thoracic Seven-Thoracid Eight - Thoracic Eight - Thoracic Nine  SURGEON:  Surgeon(s) and Role:    * Earnie Larsson, MD - Primary  PHYSICIAN ASSISTANT:   ASSISTANTS:    ANESTHESIA:   general  EBL:  200 mL   BLOOD ADMINISTERED:none  DRAINS: none   LOCAL MEDICATIONS USED:  MARCAINE     SPECIMEN:  No Specimen  DISPOSITION OF SPECIMEN:  N/A  COUNTS:  YES  TOURNIQUET:  * No tourniquets in log *  DICTATION: .Dragon Dictation  PLAN OF CARE: Admit to inpatient   PATIENT DISPOSITION:  PACU - hemodynamically stable.   Delay start of Pharmacological VTE agent (>24hrs) due to surgical blood loss or risk of bleeding: yes

## 2018-02-10 ENCOUNTER — Encounter (HOSPITAL_COMMUNITY): Payer: Self-pay | Admitting: Neurosurgery

## 2018-02-10 MED ORDER — HYDROCODONE-ACETAMINOPHEN 5-325 MG PO TABS: 1.0000 | ORAL_TABLET | ORAL | 0 refills | Status: DC | PRN

## 2018-02-10 MED ORDER — CYCLOBENZAPRINE HCL 10 MG PO TABS: 10.0000 mg | ORAL_TABLET | Freq: Three times a day (TID) | ORAL | 0 refills | Status: DC | PRN

## 2018-02-10 NOTE — Discharge Instructions (Signed)

## 2018-02-10 NOTE — Progress Notes (Signed)
Patient alert and oriented, mae's well, voiding adequate amount of urine, swallowing without difficulty, no c/o pain at time of discharge. Patient discharged home with family. Script and discharged instructions given to patient. Patient and family stated understanding of instructions given. Patient has an appointment with Dr. Pool  

## 2018-02-10 NOTE — Discharge Summary (Signed)
Physician Discharge Summary  Patient ID: Ronald Griffin MRN: [id number] DOB/AGE: July 21, 1965 52 y.o.  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  Postoperatively he is doing very well.  Preoperative pain numbness and weakness are improved.  Standing and ambulating without difficulty.  Patient feels ready for discharge home.  Consults:   Significant Diagnostic Studies:   Treatments:   Discharge Exam: Blood pressure 113/70, pulse 66, temperature 99.4 F (37.4 C), temperature source Oral, resp. rate 16, height 5' 10.5" (1.791 m), weight 110.5 kg, SpO2 95 %. Awake and alert.  Oriented and appropriate.  Cranial nerve function intact.  Motor and sensory function extremities normal.  Wound clean and dry.  Chest and abdomen benign.  Disposition: Discharge disposition: 01-Home or Self Care        Allergies as of 02/10/2018   No Known Allergies     Medication List    TAKE these medications   cyclobenzaprine 10 MG tablet Commonly known as:  FLEXERIL Take 1 tablet (10 mg total) by mouth 3 (three) times daily as needed for muscle spasms.   HYDROcodone-acetaminophen 5-325 MG tablet Commonly known as:  NORCO/VICODIN Take 1-2 tablets by mouth every 4 (four) hours as needed for moderate pain ((score 4 to 6)).        Signed: Cooper Render Amyr Sluder 02/10/2018, 8:20 AM

## 2018-02-10 NOTE — Evaluation (Signed)
Occupational Therapy Evaluation Patient Details Name: Ronald Griffin MRN: [id number] DOB: Apr 22, 1966 Today's Date: 02/10/2018    History of Present Illness 52 yo male s/p Left T7-8 and left T8-9 laminotomy and transpedicular microdiscectomy. PMH including Myelopathy and dizziness.    Clinical Impression   PTA, pt was living with his wife and was independent. Currently, pt performing ADLs and functional mobility at Mod I level. Provided education on back precautions, brace management, bed mobility, LB ADLs, toileting, and shower transfer; pt demonstrated understanding. Answered all pt questions. Recommend dc home once medically stable per physician. All acute OT needs met and will sign off. Thank you.    Follow Up Recommendations  No OT follow up;Supervision - Intermittent    Equipment Recommendations  None recommended by OT    Recommendations for Other Services       Precautions / Restrictions Precautions Precautions: Back Precaution Booklet Issued: Yes (comment) Precaution Comments: Reviewed back precautions Required Braces or Orthoses: Other Brace/Splint(No brace per MD order) Restrictions Weight Bearing Restrictions: No      Mobility Bed Mobility Overal bed mobility: Modified Independent             General bed mobility comments: Educating pt on log roll technique and pt demosntrating understanding. Requiring increased time  Transfers Overall transfer level: Modified independent               General transfer comment: Increased time    Balance Overall balance assessment: No apparent balance deficits (not formally assessed)                                         ADL either performed or assessed with clinical judgement   ADL Overall ADL's : Modified independent                                       General ADL Comments: Pt performing ADLs and functional mobility at Mod I  level. Providing education on back  precautions, brace managment, bed mobility, LB ADLs, toileting, and shower transfer. Pt demonstrating understanding.      Vision         Perception     Praxis      Pertinent Vitals/Pain Pain Assessment: 0-10 Pain Score: 4  Pain Descriptors / Indicators: Discomfort;Operative site guarding;Sore Pain Intervention(s): Monitored during session;Repositioned;Premedicated before session     Hand Dominance     Extremity/Trunk Assessment Upper Extremity Assessment Upper Extremity Assessment: Overall WFL for tasks assessed   Lower Extremity Assessment Lower Extremity Assessment: Overall WFL for tasks assessed   Cervical / Trunk Assessment Cervical / Trunk Assessment: Other exceptions Cervical / Trunk Exceptions: s/p back sx   Communication Communication Communication: No difficulties   Cognition Arousal/Alertness: Awake/alert Behavior During Therapy: WFL for tasks assessed/performed Overall Cognitive Status: Within Functional Limits for tasks assessed                                     General Comments  Pt reports two falls due to drop foot at left foot. Able to perform stairs to reach second floor at home and use walk in shower and sleep in master bedroom    Exercises     Shoulder Instructions  Home Living Family/patient expects to be discharged to:: Private residence Living Arrangements: Spouse/significant other Available Help at Discharge: Family;Available 24 hours/day(First couple of days 24/7) Type of Home: House       Home Layout: Two level;Able to live on main level with bedroom/bathroom;Bed/bath upstairs Alternate Level Stairs-Number of Steps: Flight Alternate Level Stairs-Rails: Can reach both Bathroom Shower/Tub: Walk-in shower;Tub/shower unit   Bathroom Toilet: Standard     Home Equipment: None          Prior Functioning/Environment Level of Independence: Independent        Comments: ADLs, IADLs, and 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        OT Problem List: Decreased range of motion;Decreased activity tolerance;Decreased knowledge of precautions;Pain      OT Treatment/Interventions:      OT Goals(Current goals can be found in the care plan section) Acute Rehab OT Goals Patient Stated Goal: "Go home and return to working out when ready" OT Goal Formulation: All assessment and education complete, DC therapy  OT Frequency:     Barriers to D/C:            Co-evaluation              AM-PAC PT "6 Clicks" Daily Activity     Outcome Measure Help from another person eating meals?: None Help from another person taking care of personal grooming?: None Help from another person toileting, which includes using toliet, bedpan, or urinal?: None Help from another person bathing (including washing, rinsing, drying)?: None Help from another person to put on and taking off regular upper body clothing?: None Help from another person to put on and taking off regular lower body clothing?: None 6 Click Score: 24   End of Session Nurse Communication: Mobility status  Activity Tolerance: Patient tolerated treatment well Patient left: in bed;with call bell/phone within reach  OT Visit Diagnosis: Other abnormalities of gait and mobility (R26.89);Pain Pain - part of body: (Back)                Time: 7939-6886 OT Time Calculation (min): 13 min Charges:  OT General Charges $OT Visit: 1 Visit OT Evaluation $OT Eval Low Complexity: Koppel, OTR/L Acute Rehab Pager: [phone] Office: [place] 02/10/2018, 8:36 AM

## 2018-02-12 ENCOUNTER — Telehealth: Payer: Self-pay

## 2018-02-12 NOTE — Telephone Encounter (Signed)
TCM follow up call made to patient states he does not need appointment at this time. Advised to call office when/if he feels the need. Patient agreed.

## 2018-02-20 ENCOUNTER — Telehealth: Payer: Self-pay | Admitting: *Deleted

## 2018-02-20 NOTE — Telephone Encounter (Signed)
Received request for Medical records from [place], forwarded to Medical Records via email/scan/SLS

## 2018-03-02 ENCOUNTER — Encounter: Payer: Self-pay | Admitting: Family Medicine

## 2018-03-02 ENCOUNTER — Ambulatory Visit: Payer: BLUE CROSS/BLUE SHIELD | Admitting: Family Medicine

## 2018-03-02 VITALS — BP 124/86 | HR 70 | Temp 98.2°F | Resp 16 | Ht 70.0 in | Wt 246.0 lb

## 2018-03-02 DIAGNOSIS — E785 Hyperlipidemia, unspecified: Secondary | ICD-10-CM

## 2018-03-02 DIAGNOSIS — R413 Other amnesia: Secondary | ICD-10-CM

## 2018-03-02 DIAGNOSIS — M545 Low back pain, unspecified: Secondary | ICD-10-CM

## 2018-03-02 DIAGNOSIS — M5104 Intervertebral disc disorders with myelopathy, thoracic region: Secondary | ICD-10-CM

## 2018-03-02 DIAGNOSIS — R41840 Attention and concentration deficit: Secondary | ICD-10-CM | POA: Diagnosis not present

## 2018-03-02 NOTE — Assessment & Plan Note (Signed)
Encouraged heart healthy diet, increase exercise, avoid trans fats, consider a krill oil cap daily 

## 2018-03-02 NOTE — Patient Instructions (Addendum)
Encouraged increased hydration and fiber in diet. Daily probiotics. If bowels not moving can use MOM 2 tbls po in 4 oz of warm prune juice by mouth every 2-3 days. If no results then repeat in 4 hours with  Dulcolax suppository pr, may repeat again in 4 more hours as needed. Seek care if symptoms worsen. Consider daily Miralax and/or Dulcolax if symptoms persist.   Cholesterol Cholesterol is a white, waxy, fat-like substance that is needed by the human body in small amounts. The liver makes all the cholesterol we need. Cholesterol is carried from the liver by the blood through the blood vessels. Deposits of cholesterol (plaques) may build up on blood vessel (artery) walls. Plaques make the arteries narrower and stiffer. Cholesterol plaques increase the risk for heart attack and stroke. You cannot feel your cholesterol level even if it is very high. The only way to know that it is high is to have a blood test. Once you know your cholesterol levels, you should keep a record of the test results. Work with your health care provider to keep your levels in the desired range. What do the results mean?  Total cholesterol is a rough measure of all the cholesterol in your blood.  LDL (low-density lipoprotein) is the "bad" cholesterol. This is the type that causes plaque to build up on the artery walls. You want this level to be low.  HDL (high-density lipoprotein) is the "good" cholesterol because it cleans the arteries and carries the LDL away. You want this level to be high.  Triglycerides are fat that the body can either burn for energy or store. High levels are closely linked to heart disease. What are the desired levels of cholesterol?  Total cholesterol below 200.  LDL below 100 for people who are at risk, below 70 for people at very high risk.  HDL above 40 is good. A level of 60 or higher is considered to be protective against heart disease.  Triglycerides below 150. How can I lower my  cholesterol? Diet Follow your diet program as told by your health care provider.  Choose fish or white meat chicken and [place], roasted or baked. Limit fatty cuts of red meat, fried foods, and processed meats, such as sausage and lunch meats.  Eat lots of fresh fruits and vegetables.  Choose whole grains, beans, pasta, potatoes, and cereals.  Choose olive oil, corn oil, or canola oil, and use only small amounts.  Avoid butter, mayonnaise, shortening, or palm kernel oils.  Avoid foods with trans fats.  Drink skim or nonfat milk and eat low-fat or nonfat yogurt and cheeses. Avoid whole milk, cream, ice cream, egg yolks, and full-fat cheeses.  Healthier desserts include angel food cake, ginger snaps, animal crackers, hard candy, popsicles, and low-fat or nonfat frozen yogurt. Avoid pastries, cakes, pies, and cookies.  Exercise  Follow your exercise program as told by your health care provider. A regular program: ? Helps to decrease LDL and raise HDL. ? Helps with weight control.  Do things that increase your activity level, such as gardening, walking, and taking the stairs.  Ask your health care provider about ways that you can be more active in your daily life.  Medicine  Take over-the-counter and prescription medicines only as told by your health care provider. ? Medicine may be prescribed by your health care provider to help lower cholesterol and decrease the risk for heart disease. This is usually done if diet and exercise have failed to bring down  cholesterol levels. ? If you have several risk factors, you may need medicine even if your levels are normal.  This information is not intended to replace advice given to you by your health care provider. Make sure you discuss any questions you have with your health care provider. Document Released: 01/08/2001 Document Revised: 11/11/2015 Document Reviewed: 10/14/2015 Elsevier Interactive Patient Education  [employer].

## 2018-03-03 DIAGNOSIS — R413 Other amnesia: Secondary | ICD-10-CM | POA: Insufficient documentation

## 2018-03-03 DIAGNOSIS — R41840 Attention and concentration deficit: Secondary | ICD-10-CM | POA: Insufficient documentation

## 2018-03-03 LAB — LIPID PANEL
CHOL/HDL RATIO: 4
Cholesterol: 238 mg/dL — ABNORMAL HIGH (ref 0–200)
HDL: 55.7 mg/dL (ref >39.00)
LDL CALC: 153 mg/dL — AB (ref 0–99)
NONHDL: 182.36
Triglycerides: 145 mg/dL (ref 0.0–149.0)
VLDL: 29 mg/dL (ref 0.0–40.0)

## 2018-03-03 LAB — COMPREHENSIVE METABOLIC PANEL
ALK PHOS: 96 U/L (ref 39–117)
ALT: 21 U/L (ref 0–53)
AST: 17 U/L (ref 0–37)
Albumin: 4.7 g/dL (ref 3.5–5.2)
BILIRUBIN TOTAL: 0.6 mg/dL (ref 0.2–1.2)
BUN: 14 mg/dL (ref 6–23)
CO2: 27 meq/L (ref 19–32)
Calcium: 9.6 mg/dL (ref 8.4–10.5)
Chloride: 105 mEq/L (ref 96–112)
Creatinine, Ser: 0.89 mg/dL (ref 0.40–1.50)
GFR: 94.63 mL/min (ref >60.00)
GLUCOSE: 102 mg/dL — AB (ref 70–99)
Potassium: 4.1 mEq/L (ref 3.5–5.1)
SODIUM: 140 meq/L (ref 135–145)
TOTAL PROTEIN: 7.2 g/dL (ref 6.0–8.3)

## 2018-03-03 LAB — CBC
HCT: 43 % (ref 39.0–52.0)
Hemoglobin: 14.6 g/dL (ref 13.0–17.0)
MCHC: 34 g/dL (ref 30.0–36.0)
MCV: 91.5 fl (ref 78.0–100.0)
PLATELETS: 263 10*3/uL (ref 150.0–400.0)
RBC: 4.69 Mil/uL (ref 4.22–5.81)
RDW: 13 % (ref 11.5–15.5)
WBC: 6.3 10*3/uL (ref 4.0–10.5)

## 2018-03-03 LAB — VITAMIN D 25 HYDROXY (VIT D DEFICIENCY, FRACTURES): VITD: 34.89 ng/mL (ref 30.00–100.00)

## 2018-03-03 NOTE — Assessment & Plan Note (Signed)
Underwent surgery about 2 weeks ago. Does not feel he is worse or better but is following closely with neurosurgery presently.

## 2018-03-03 NOTE — Assessment & Plan Note (Addendum)
His wife is with him and they report he has been struggling with worsening memory. He still manages his ADLs but they feel it is time to get evaluated. It has been progressing for at least a year but has taken a turn for the worse since his back surgery 2 weeks ago. He notes more trouble with recent memories than older memories. Of note his PGF had dementia and his father had what sounds like frontotemporal dementia with loss of impulse control and new onset aggressive behavior and hypersexual behavior. The patient himself has not developed this yet. Discussed possiblity of neurology consult but with start with neurocognitive testing. Spent 25 minutes in counseling today regarding all of his concerns.

## 2018-03-03 NOTE — Assessment & Plan Note (Signed)
He has had trouble with this over the years but is worsening and having trouble getting tasks done. Will set up ADD testing at present

## 2018-03-03 NOTE — Progress Notes (Signed)
Subjective:    Patient ID: Ronald Griffin, male    DOB: 10-22-1965, 52 y.o.   MRN: [id number]  Chief Complaint  Patient presents with  . Memory problems    Complains of not remembering things, disorinented and hard to concentrate.     HPI Patient is in today for evaluation of several concerns. His wife is with him and they report he has been struggling with worsening memory. He still manages his ADLs but they feel it is time to get evaluated. It has been progressing for at least a year but has taken a turn for the worse since his back surgery 2 weeks ago. He notes more trouble with recent memories than older memories. Of note his PGF had dementia and his father had what sounds like frontotemporal dementia with loss of impulse control and new onset aggressive behavior and hypersexual behavior. The patient himself has not developed this yet. He is recovering from his thoracicspine surgery two weeks ago but notes he feels he is not better or worse yet. No fevers or chills. Also notes worsening trouble with concentration and finishing tasks.  Denies CP/palp/SOB/HA/congestion/fevers/GI or GU c/o. Taking meds as prescribed  Past Medical History:  Diagnosis Date  . Dizziness   . Elevated antinuclear antibody (ANA) level 11/21/2014  . Erectile dysfunction 11/21/2014  . Frequent headaches   . Hand pain, right 02/16/2017  . Headache 03/29/2014  . History of colonic polyps 11/21/2014  . Hyperlipidemia   . Knee pain, right 11/21/2014  . Low back pain 04/03/2015  . Motion sickness 11/21/2014  . Myelopathy ([place])    thoracic  . Obesity 02/16/2017  . Post-operative nausea and vomiting   . Preventative health care 11/21/2014    Past Surgical History:  Procedure Laterality Date  . COLONOSCOPY  2012   in [place]    . THORACIC DISCECTOMY Left 02/09/2018   Procedure: Microdiscectomy - Left - Thoracic Seven-Thoracid Eight - Thoracic Eight - Thoracic Nine;  Surgeon: Earnie Larsson, MD;  Location:  [facility];  Service: Neurosurgery;  Laterality: Left;  Microdiscectomy - Left - Thoracic Seven-Thoracid Eight - Thoracic Eight - Thoracic Nine  . VARICOCELE EXCISION    . VASECTOMY      Family History  Problem Relation Age of Onset  . Lung cancer Mother   . Breast cancer Mother   . Cancer Mother        breast age 85s, colon late 6s, lung, liver, skin  . Colon cancer Mother 31  . Bipolar disorder Father   . Cancer Father 72       colon cancer  . Dementia Father   . Colon cancer Father 10  . Cancer Maternal Grandmother        ?  Marland Kitchen Heart disease Maternal Grandfather        MI, sudden  . Stroke Paternal Grandmother 41  . Bipolar disorder Paternal Grandfather   . Dementia Paternal Grandfather   . Cancer Paternal Grandfather        colon  . Colon cancer Paternal Grandfather   . Obesity Brother   . Colon cancer Cousin 38  . Esophageal cancer Neg Hx   . Stomach cancer Neg Hx     Social History   Socioeconomic History  . Marital status: Married    Spouse name: [name]  . Number of children: 2  . Years of education: college  . Highest education level: Not on file  Occupational History  . Not  on file  Social Needs  . Financial resource strain: Not on file  . Food insecurity:    Worry: Not on file    Inability: Not on file  . Transportation needs:    Medical: Not on file    Non-medical: Not on file  Tobacco Use  . Smoking status: Never Smoker  . Smokeless tobacco: Never Used  Substance and Sexual Activity  . Alcohol use: Yes    Alcohol/week: 3.0 - 5.0 standard drinks    Types: 3 - 5 Standard drinks or equivalent per week    Comment: weekly 3-5 times per pt  . Drug use: No  . Sexual activity: Yes    Comment: lives with wife, works in [employer], part time at [employer], no dietary restrictions, exercises regularly  Lifestyle  . Physical activity:    Days per week: Not on file    Minutes per session: Not on file  . Stress: Not on file  Relationships  . Social  connections:    Talks on phone: Not on file    Gets together: Not on file    Attends religious service: Not on file    Active member of club or organization: Not on file    Attends meetings of clubs or organizations: Not on file    Relationship status: Not on file  . Intimate partner violence:    Fear of current or ex partner: Not on file    Emotionally abused: Not on file    Physically abused: Not on file    Forced sexual activity: Not on file  Other Topics Concern  . Not on file  Social History Narrative   Patient works full time for [employer]. Patient lives at home with his wife [name]).   Education college.   Right handed.   Caffeine 4-6 cups daily.     Outpatient Medications Prior to Visit  Medication Sig Dispense Refill  . cyclobenzaprine (FLEXERIL) 10 MG tablet Take 1 tablet (10 mg total) by mouth 3 (three) times daily as needed for muscle spasms. (Patient not taking: Reported on 03/02/2018) 30 tablet 0  . HYDROcodone-acetaminophen (NORCO/VICODIN) 5-325 MG tablet Take 1-2 tablets by mouth every 4 (four) hours as needed for moderate pain ((score 4 to 6)). (Patient not taking: Reported on 03/02/2018) 30 tablet 0   No facility-administered medications prior to visit.     No Known Allergies  Review of Systems  Constitutional: Negative for fever and malaise/fatigue.  HENT: Negative for congestion.   Eyes: Negative for blurred vision.  Respiratory: Negative for shortness of breath.   Cardiovascular: Negative for chest pain, palpitations and leg swelling.  Gastrointestinal: Negative for abdominal pain, blood in stool and nausea.  Genitourinary: Negative for dysuria and frequency.  Musculoskeletal: Positive for back pain. Negative for falls.  Skin: Negative for rash.  Neurological: Negative for dizziness, loss of consciousness and headaches.  Endo/Heme/Allergies: Negative for environmental allergies.  Psychiatric/Behavioral: Positive for memory loss.  Negative for depression. The patient is nervous/anxious.        Objective:    Physical Exam  Constitutional: He is oriented to person, place, and time. He appears well-developed and well-nourished. No distress.  HENT:  Head: Normocephalic and atraumatic.  Nose: Nose normal.  Eyes: Right eye exhibits no discharge. Left eye exhibits no discharge.  Neck: Normal range of motion. Neck supple.  Cardiovascular: Normal rate and regular rhythm.  No murmur heard. Pulmonary/Chest: Effort normal and breath sounds normal.  Abdominal: Soft. Bowel  sounds are normal. There is no tenderness.  Musculoskeletal: He exhibits no edema.  2-3 inch scar thoracic spine, healing well.no surrounding fluctuance.   Neurological: He is alert and oriented to person, place, and time. He displays normal reflexes. Coordination normal.  Skin: Skin is warm and dry.  Psychiatric: He has a normal mood and affect.  Nursing note and vitals reviewed.   BP 124/86 (BP Location: Right Arm, Patient Position: Sitting, Cuff Size: Large)   Pulse 70   Temp 98.2 F (36.8 C) (Oral)   Resp 16   Ht 5\' 10"  (1.778 m)   Wt 246 lb (111.6 kg)   SpO2 96%   BMI 35.30 kg/m  Wt Readings from Last 3 Encounters:  03/02/18 246 lb (111.6 kg)  02/09/18 243 lb 9.6 oz (110.5 kg)  01/16/18 243 lb 9.6 oz (110.5 kg)     Lab Results  Component Value Date   WBC 5.4 02/09/2018   HGB 13.8 02/09/2018   HCT 42.1 02/09/2018   PLT 222 02/09/2018   GLUCOSE 105 (H) 02/09/2018   CHOL 234 (H) 02/14/2017   TRIG 201.0 (H) 02/14/2017   HDL 53.30 02/14/2017   LDLDIRECT 140.0 02/14/2017   LDLCALC 108 (H) 11/21/2014   ALT 30 08/04/2017   AST 21 08/04/2017   NA 139 02/09/2018   K 3.7 02/09/2018   CL 108 02/09/2018   CREATININE 1.00 02/09/2018   BUN 16 02/09/2018   CO2 24 02/09/2018   TSH 4.45 02/14/2017   PSA 0.50 11/21/2014    Lab Results  Component Value Date   TSH 4.45 02/14/2017   Lab Results  Component Value Date   WBC 5.4  02/09/2018   HGB 13.8 02/09/2018   HCT 42.1 02/09/2018   MCV 92.1 02/09/2018   PLT 222 02/09/2018   Lab Results  Component Value Date   NA 139 02/09/2018   K 3.7 02/09/2018   CO2 24 02/09/2018   GLUCOSE 105 (H) 02/09/2018   BUN 16 02/09/2018   CREATININE 1.00 02/09/2018   BILITOT 0.7 08/04/2017   ALKPHOS 64 08/04/2017   AST 21 08/04/2017   ALT 30 08/04/2017   PROT 7.3 08/04/2017   ALBUMIN 4.5 08/04/2017   CALCIUM 8.7 (L) 02/09/2018   ANIONGAP 7 02/09/2018   GFR 91.28 08/04/2017   Lab Results  Component Value Date   CHOL 234 (H) 02/14/2017   Lab Results  Component Value Date   HDL 53.30 02/14/2017   Lab Results  Component Value Date   LDLCALC 108 (H) 11/21/2014   Lab Results  Component Value Date   TRIG 201.0 (H) 02/14/2017   Lab Results  Component Value Date   CHOLHDL 4 02/14/2017   No results found for: HGBA1C     Assessment & Plan:   Problem List Items Addressed This Visit    Hyperlipidemia    Encouraged heart healthy diet, increase exercise, avoid trans fats, consider a krill oil cap daily      Relevant Orders   Lipid panel   Low back pain   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 Does not feel he is worse or better but is following closely with neurosurgery presently.       Memory changes    His wife is with him and they report he has been struggling with worsening memory. He still manages his ADLs but they feel it is time to get evaluated. It has been progressing  for at least a year but has taken a turn for the worse since his back surgery 2 weeks ago. He notes more trouble with recent memories than older memories. Of note his PGF had dementia and his father had what sounds like frontotemporal dementia with loss of impulse control and new onset aggressive behavior and hypersexual behavior. The patient himself has not developed this yet. Discussed possiblity of neurology consult but  with start with neurocognitive testing. Spent 25 minutes in counseling today regarding all of his concerns.       Attention and concentration deficit - Primary    He has had trouble with this over the years but is worsening and having trouble getting tasks done. Will set up ADD testing at present      Relevant Orders   Ambulatory referral to [place]    Other Visit Diagnoses    Memory deficit       Relevant Orders   Ambulatory referral to [place] [facility]   CBC   Hypocalcemia       Relevant Orders   Comprehensive metabolic panel   VITAMIN D 25 Hydroxy (Vit-D Deficiency, Fractures)      I have discontinued Pablo Ledger. Bevans's HYDROcodone-acetaminophen. I am also having him maintain his cyclobenzaprine.  No orders of the defined types were placed in this encounter.    Penni Homans, MD

## 2018-03-04 MED ORDER — ATORVASTATIN CALCIUM 10 MG PO TABS: 10.0000 mg | ORAL_TABLET | Freq: Every day | ORAL | 3 refills | Status: DC

## 2018-03-04 NOTE — Addendum Note (Signed)
Addended byDamita Dunnings D on: 03/04/2018 09:58 AM   Modules accepted: Orders

## 2018-03-10 ENCOUNTER — Telehealth: Payer: Self-pay | Admitting: *Deleted

## 2018-03-10 NOTE — Telephone Encounter (Signed)
Received request for Medical Records from [facility] [facility]; forwarded to Medical Records via email/scan/SLS  

## 2018-06-12 ENCOUNTER — Ambulatory Visit: Payer: Self-pay | Admitting: Family Medicine

## 2018-07-13 ENCOUNTER — Ambulatory Visit: Payer: BLUE CROSS/BLUE SHIELD | Admitting: Psychology

## 2018-07-17 IMAGING — CR DG RIBS W/ CHEST 3+V*L*
3 series · 3 of 3 positions shown · non-contrast
Comparison: None.

CLINICAL DATA: Right rib pain after injury.

EXAM:
LEFT RIBS AND CHEST - 3+ VIEW

[w chest pa *]
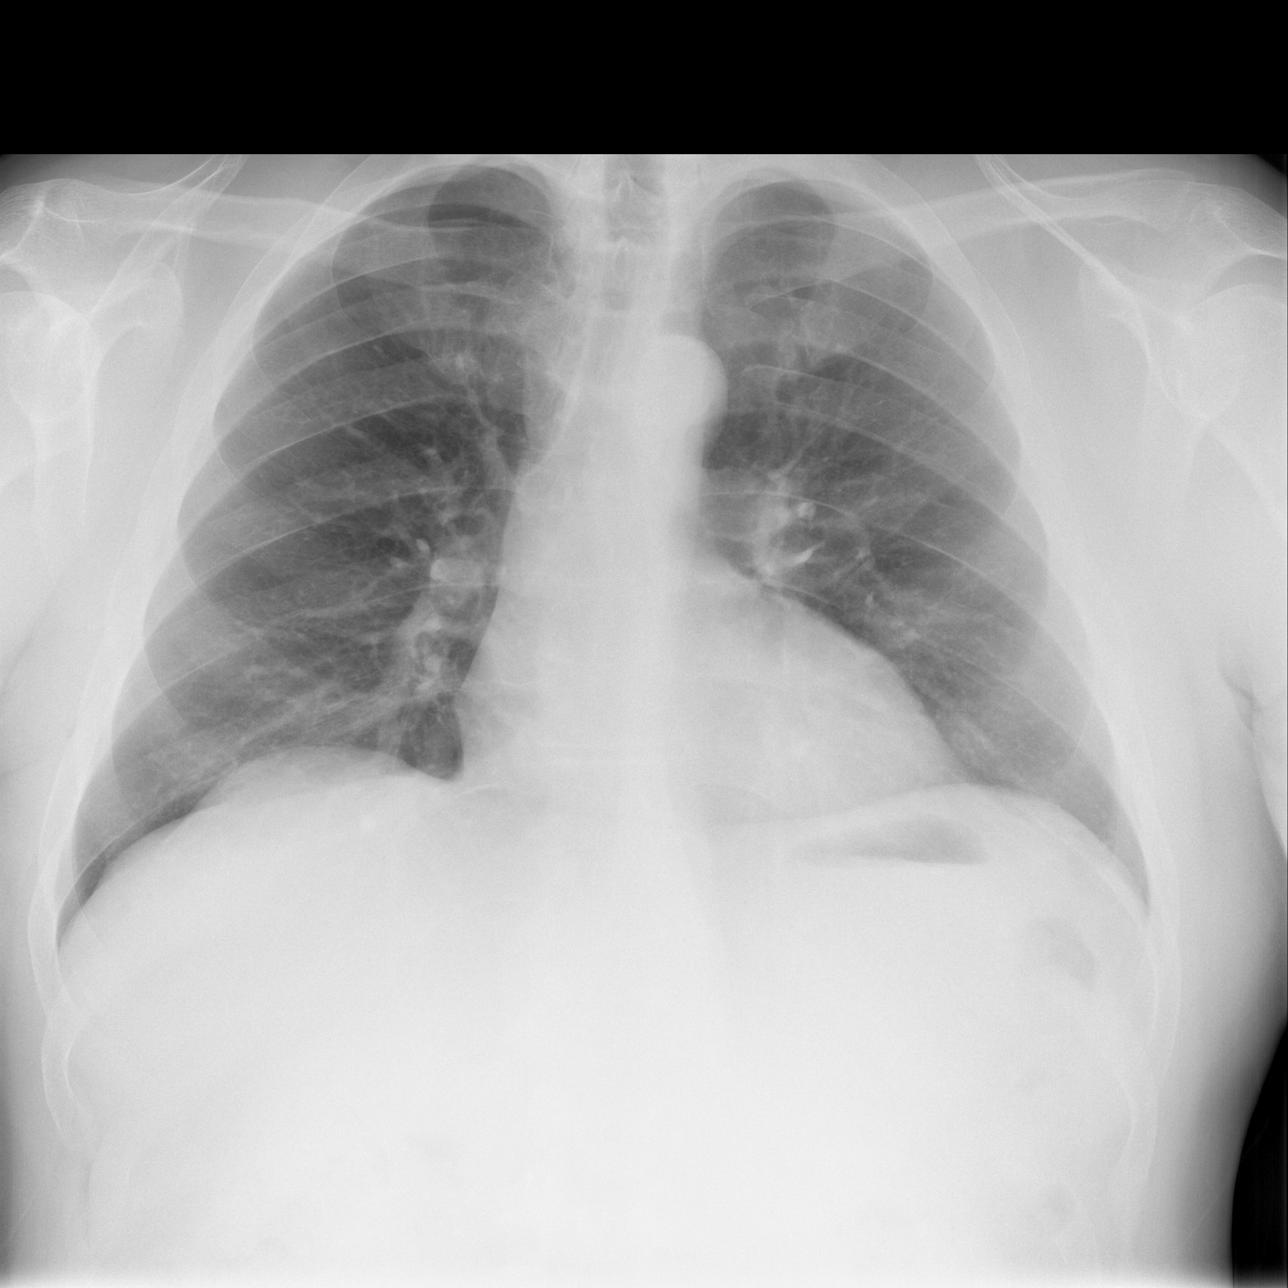

[w ribs ap/pa lower left *]
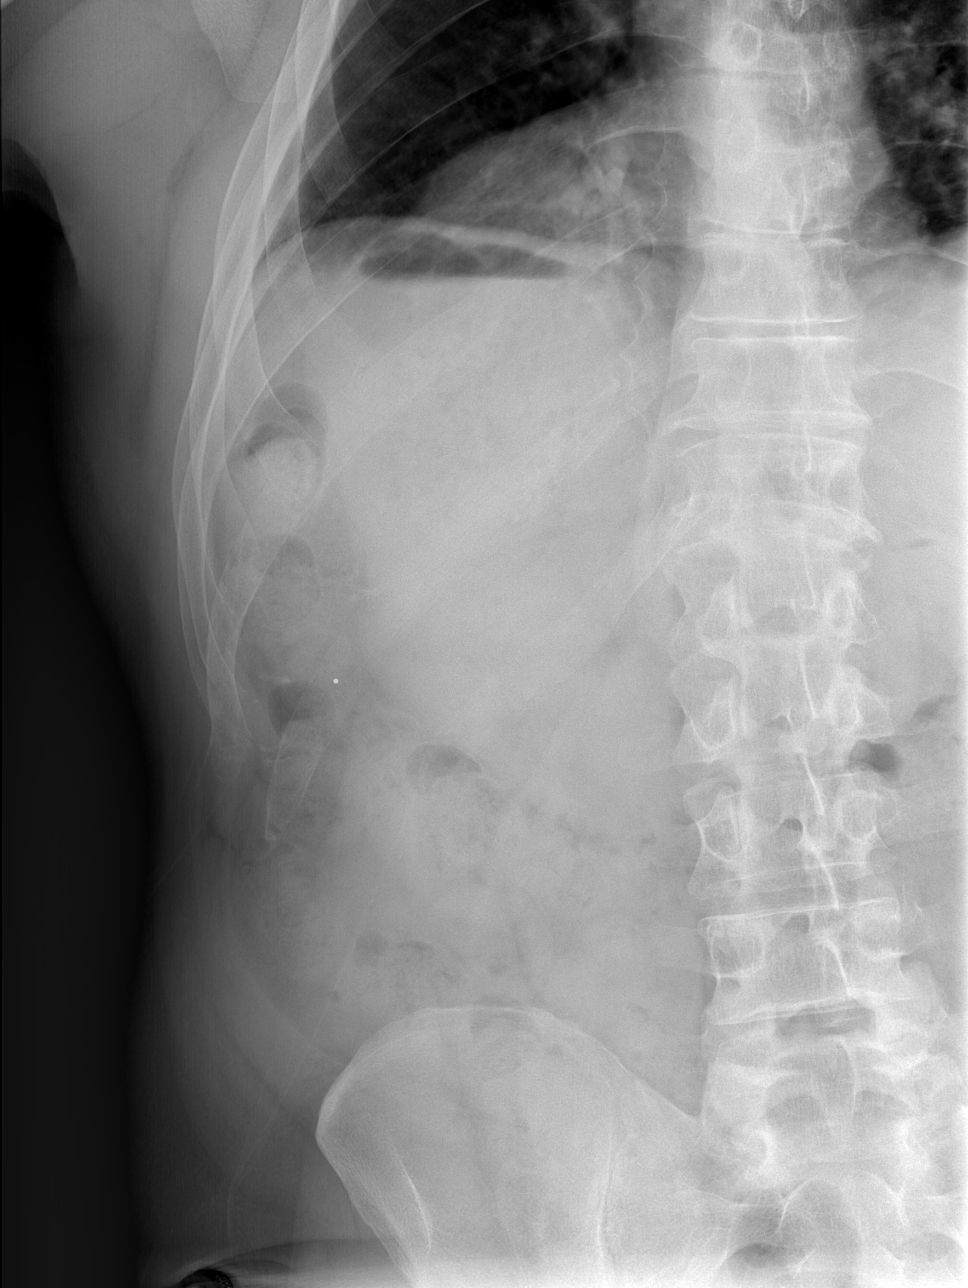

[w ribs oblique left *]
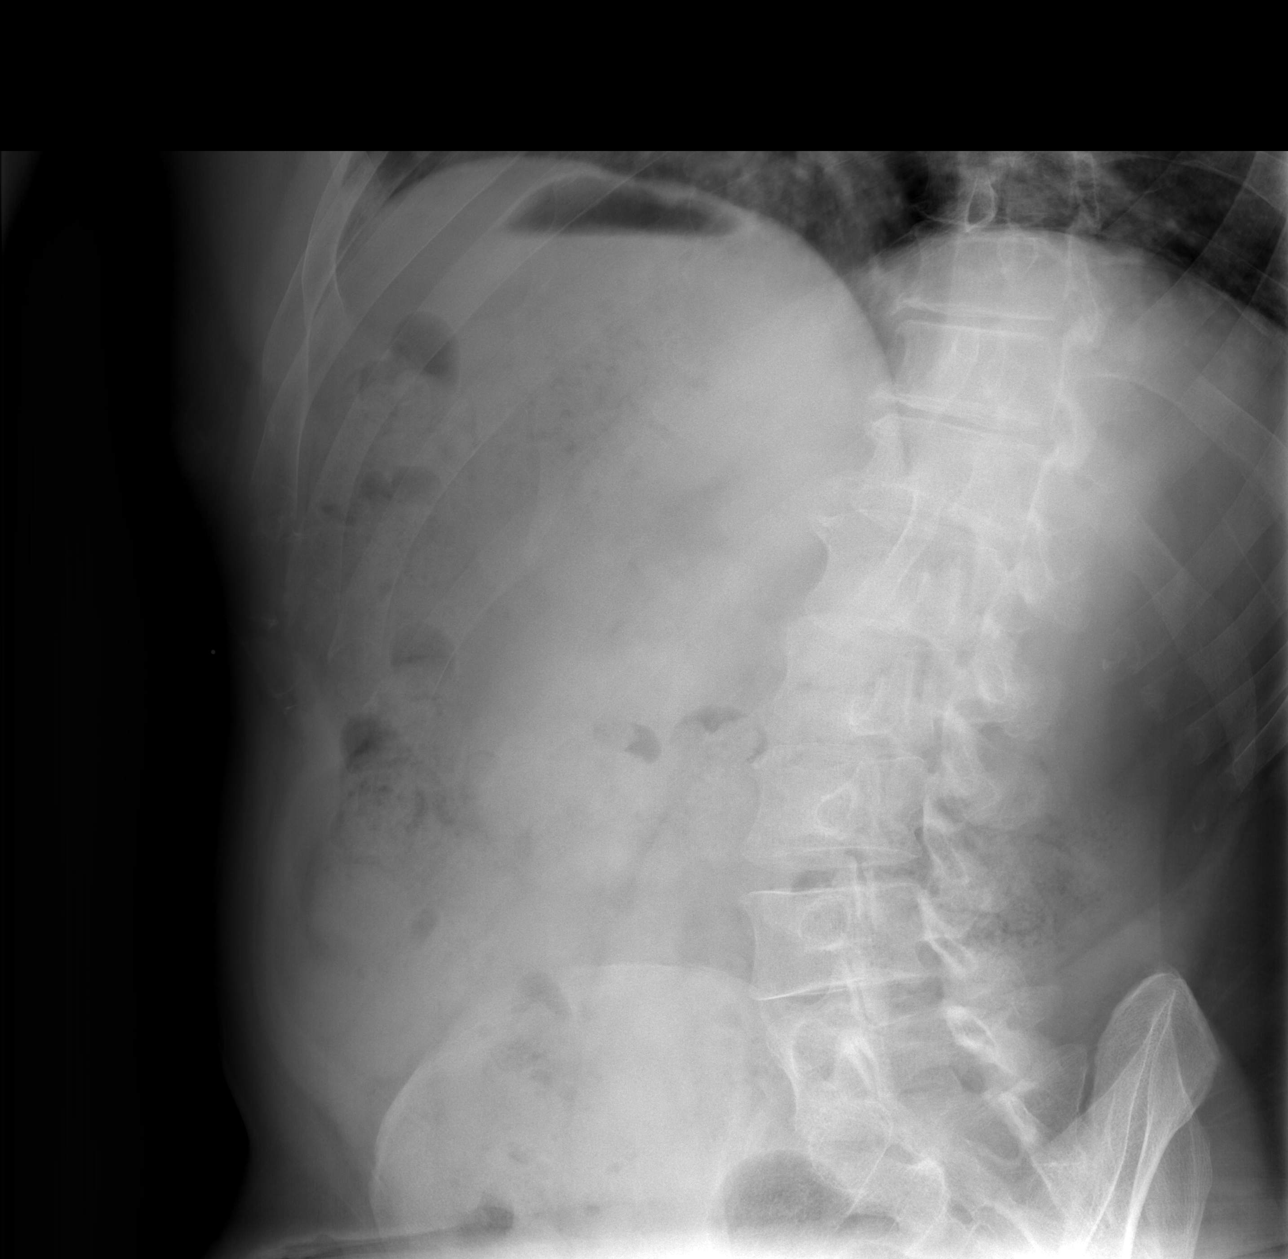

[3 of 3 positions shown; findings below may reference images not displayed]

FINDINGS: No fracture or other bone lesions are seen involving the ribs. There
is no evidence of pneumothorax or pleural effusion. Both lungs are
clear. Heart size and mediastinal contours are within normal limits.
IMPRESSION: Negative radiographs of the chest and right ribs.

## 2018-07-21 ENCOUNTER — Ambulatory Visit: Payer: BLUE CROSS/BLUE SHIELD | Admitting: Psychology

## 2018-08-18 ENCOUNTER — Telehealth: Payer: Self-pay | Admitting: *Deleted

## 2018-08-18 NOTE — Telephone Encounter (Signed)
Received request for Medical Records from [facility]; forwarded to Medical Records via email/scan/SLS

## 2019-08-26 IMAGING — CR DG THORACOLUMBAR SPINE 2V
2 series · 2 of 2 positions shown · non-contrast
Comparison: Two cross-table lateral images of the lumbar spine are
compared to prior thoracolumbar radiographs of 08/04/2017, MRI
lumbar spine of 08/20/2017, and MR thoracic spine 10/01/2017

CLINICAL DATA: LEFT T7-T8 T9 micro diskectomy for myelopathy

EXAM:
THORACOLUMBAR SPINE 1V

[lateral (1 of 2)]
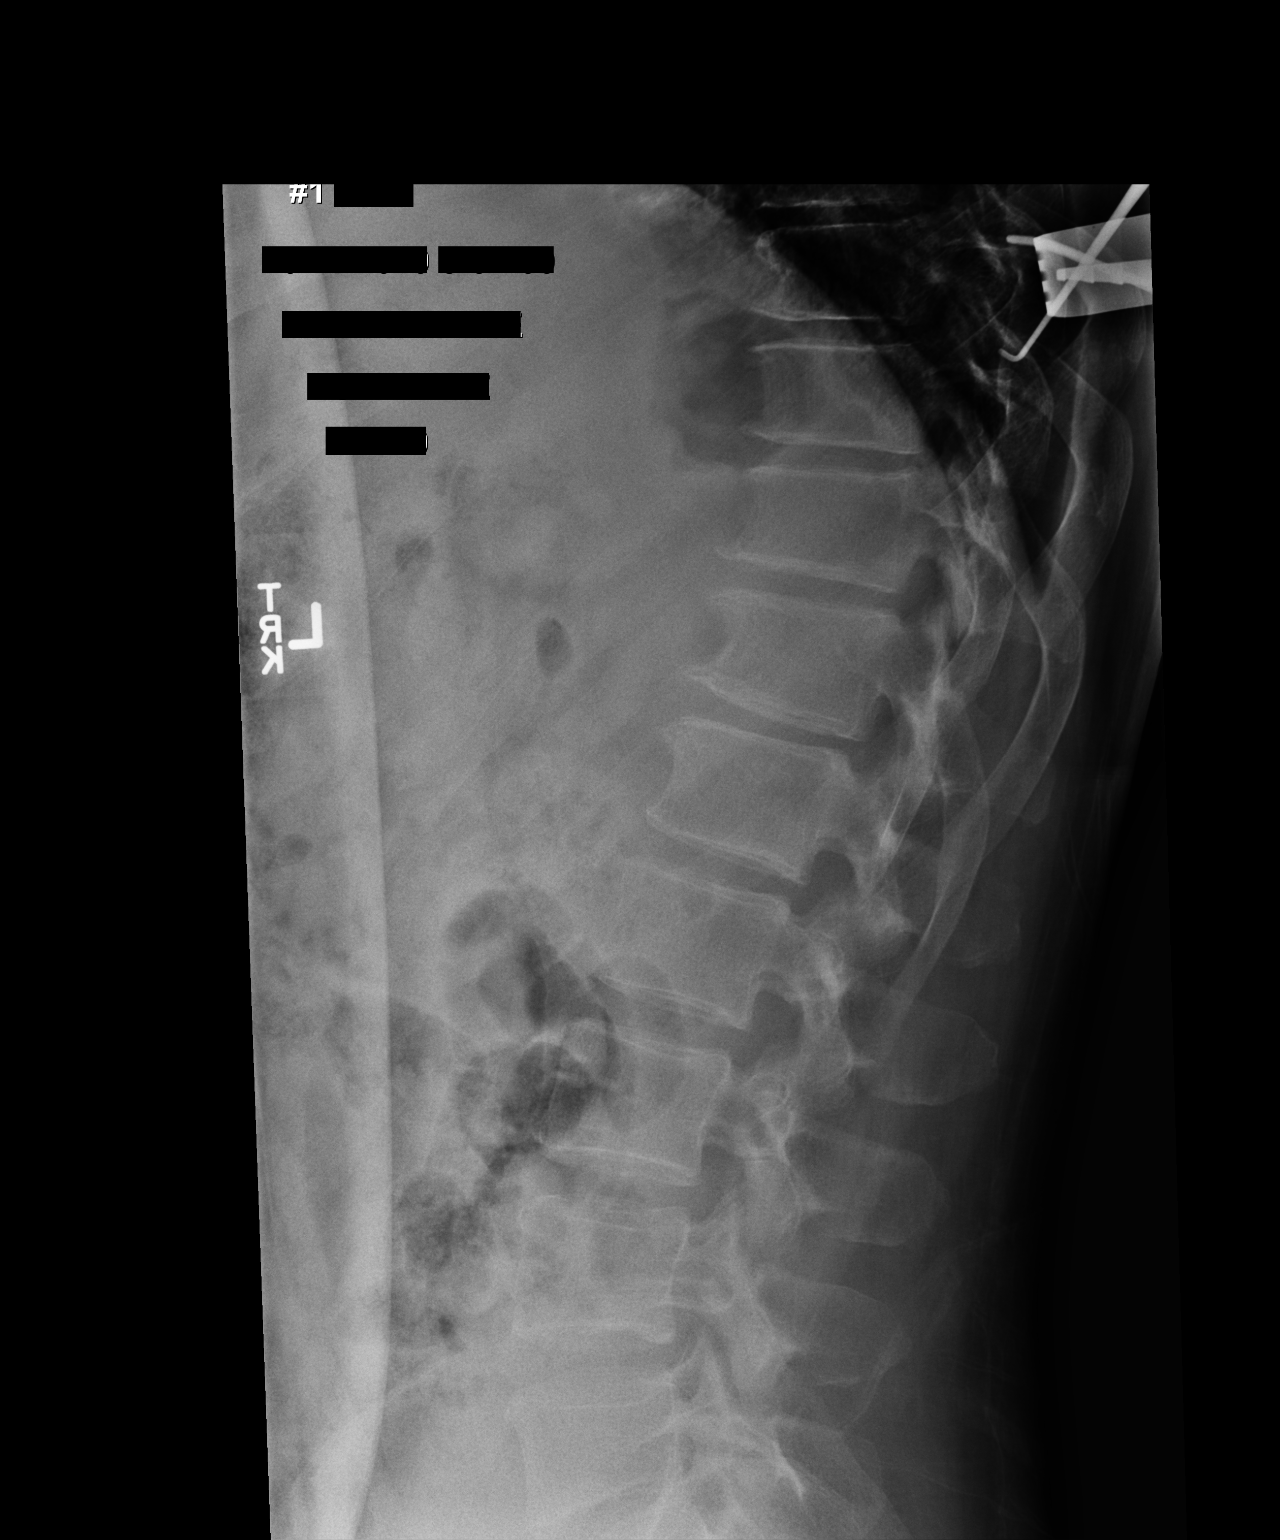

[lateral (2 of 2)]
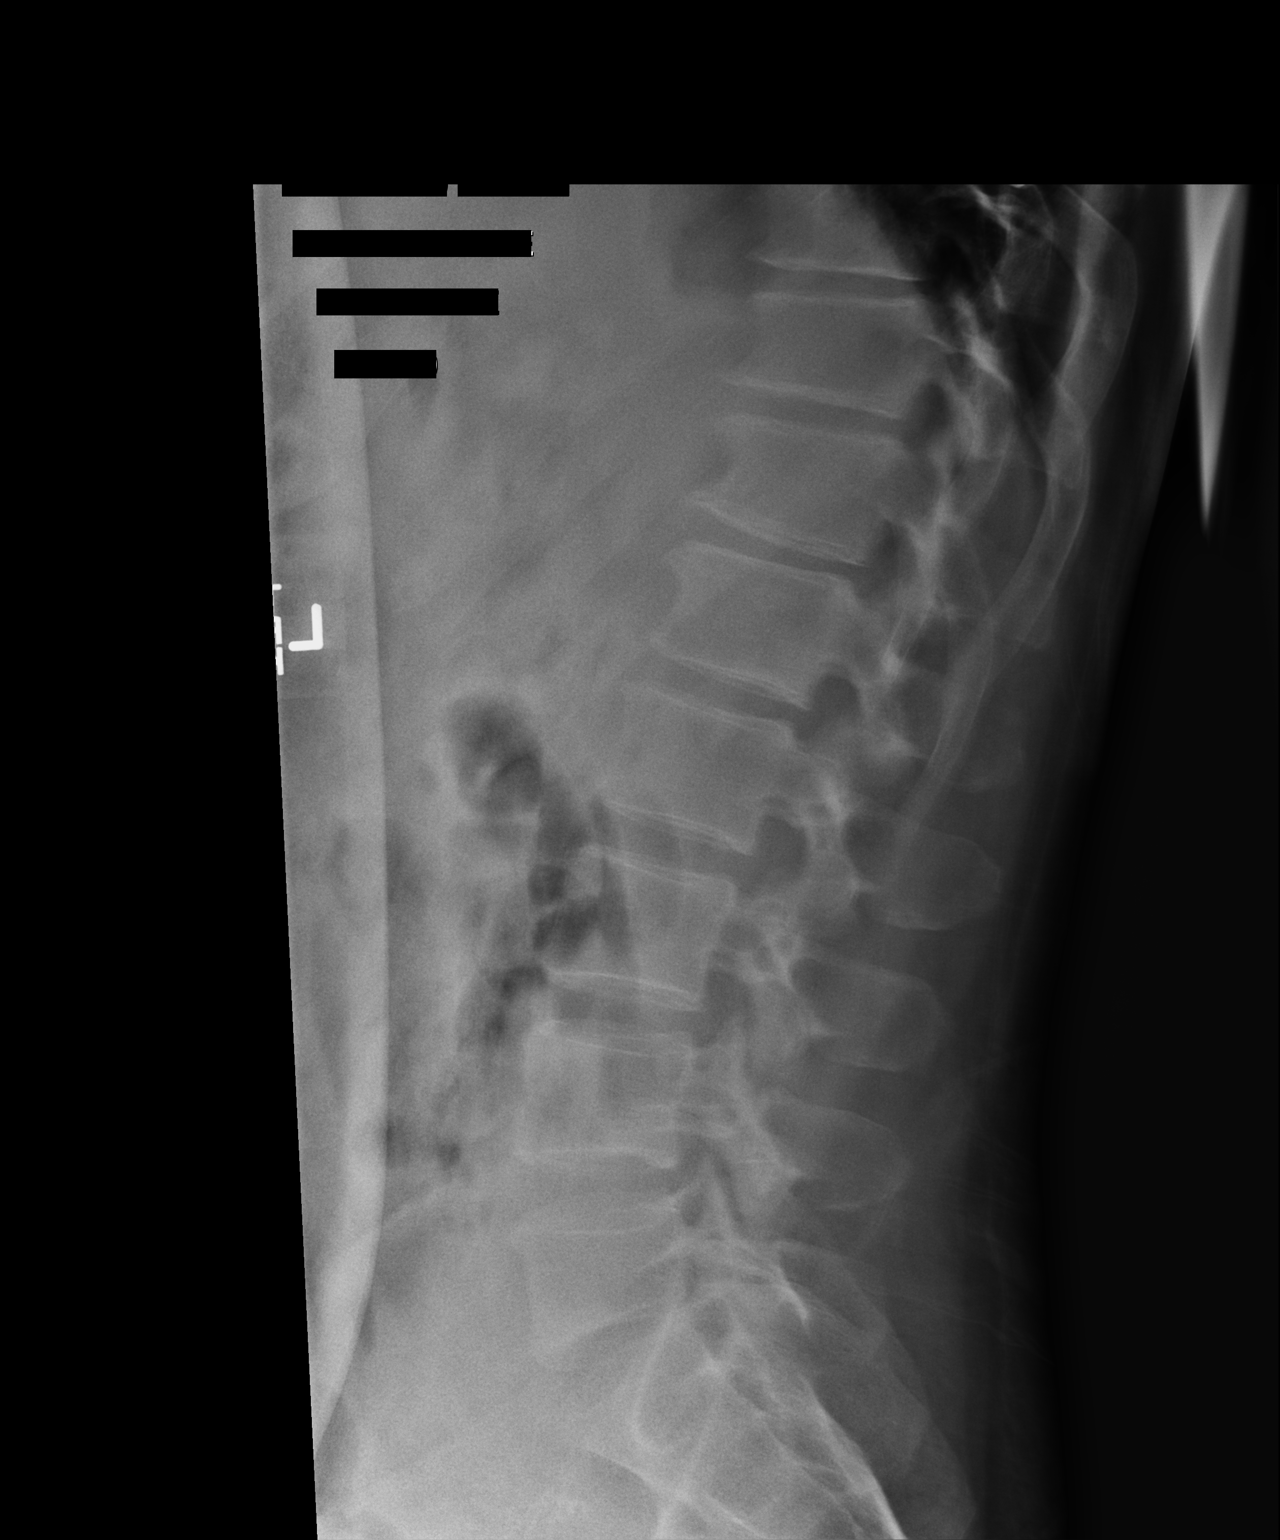

[2 of 2 positions shown; findings below may reference images not displayed]

FINDINGS: Five lumbar vertebra labeled on prior MR lumbar spine.

Image #1 at 0835 hours: Lumbosacral junction excluded, cannot assign
accurate segment levels.

Image #2 at 6114 hours: Crossed metallic probes are present at the
lower thoracic spine, with the tips projecting at these levels of
the superior endplate of T9 and at the T9-T10 disc space.
IMPRESSION: Metallic probes are present at the superior T9 and T9-T10 disc space
levels.

## 2019-09-15 ENCOUNTER — Other Ambulatory Visit: Payer: Self-pay

## 2019-09-15 ENCOUNTER — Ambulatory Visit: Payer: 59 | Admitting: Medical

## 2019-09-15 ENCOUNTER — Telehealth: Payer: Self-pay

## 2019-09-15 VITALS — BP 124/80 | HR 58 | Resp 18 | Ht 70.0 in | Wt 238.2 lb

## 2019-09-15 DIAGNOSIS — M5104 Intervertebral disc disorders with myelopathy, thoracic region: Secondary | ICD-10-CM | POA: Diagnosis not present

## 2019-09-15 MED ORDER — PREDNISONE 10 MG (21) PO TBPK: ORAL_TABLET | ORAL | 0 refills | Status: DC

## 2019-09-15 MED ORDER — AZELAIC ACID 15 % EX GEL: CUTANEOUS | 0 refills | Status: DC

## 2019-09-15 MED ORDER — SILDENAFIL CITRATE 25 MG PO TABS: 25.0000 mg | ORAL_TABLET | Freq: Every day | ORAL | 0 refills | Status: DC | PRN

## 2019-09-15 NOTE — Telephone Encounter (Addendum)
Azelaic acid not covered by [employer]. Preferred alternatives: generic topical metronidazole   Sent in [facility]cream as alternative. If he wants other then original prescriber(dermatologist?) might be able to get prior auth.

## 2019-09-15 NOTE — Telephone Encounter (Signed)
PA initiated via Covermymeds; KEY: [id number]. Awaiting determination.

## 2019-09-15 NOTE — Progress Notes (Signed)
Subjective:    Patient ID: Ronald Griffin, male    DOB: Apr 08, 1966, 54 y.o.   MRN: [id number]  HPI  Pt in for low back pain.  Pt states had seen neurosurgeon at [facility] and also had PT. He states had to change provider briefly due to insurance change.  Pt states PT did help/gave good advise.  Pt had history of microdiscetomy in 2019.  Pt still has significant pain. He states he needs forms for [employer] filled out to support long term disability. Pt when last saw Dr. Charlett Blake note reads.  "Patient is in today for follow pu and evaluaiton of his ongoing back pain with radiculopathy. Continues to struggle with significant low back pain daily that limits his ability to manage his ADLs and has been unable to return to work. He has significant radiculopathy into his left foot with poor control of the foot and poor balance as a result. No recent falls. He is preparing for his consultation and is ready to proceed with the next step. He is managing without meds but is having to adjust his activity significantly. Denies CP/palp/SOB/HA/congestion/fevers/GI or GU c/o. Taking meds as prescribed. No incontinence."  Pt still states has pain that effects ADL and he feels can't work. Pt feels like can't do prior occupation or other form employment. Even doing light activity such as cutting grass attempt cause severe pain next day with numbness to leg burning and weak. Back pain severe sitting and changing positions. Back feels very stiff and hard to reach upwards. Pt states if he were to work a regular job he feels he would call out all the times.  Pt states Dr. Annette Stable not optimistic that solution can be found.  He has hard time sleeping due to pain.When he walks feels like walking on hot gravel at times. Pt states traveling in car has to stop and take breaks after 20 minutes. Describes uncoordinated gait at times.  In the past on controlled med review he had used norco. He states trying gabapentin 800 mg  daily total and it did not help. Pt states lyrica and norco did not help.  Pt has never used prednisone.   Pt also mentions that he thinks he might have ADD. Pcp is aware of this.(he tried son med and he noticed that his back pain was less.) pt does think he has some difficulty concentrating for years about 12-15 years ago. He had script in the past.   He also reported ED in past and wanted refill of viagra. Also wanted refill of med for rash on forehead. States acne or rosacea.  Review of Systems  Constitutional: Negative for chills, fatigue and fever.  Respiratory: Negative for cough, chest tightness, shortness of breath and wheezing.   Cardiovascular: Negative for chest pain and palpitations.  Gastrointestinal: Negative for abdominal pain.  Genitourinary: Negative for dysuria and frequency.  Musculoskeletal: Positive for back pain.  Skin: Negative for rash.  Neurological: Negative for dizziness, weakness and headaches.  Hematological: Negative for adenopathy. Does not bruise/bleed easily.  Psychiatric/Behavioral: Negative for behavioral problems, dysphoric mood and sleep disturbance. The patient is not nervous/anxious.      Past Medical History:  Diagnosis Date  . Dizziness   . Elevated antinuclear antibody (ANA) level 11/21/2014  . Erectile dysfunction 11/21/2014  . Frequent headaches   . Hand pain, right 02/16/2017  . Headache 03/29/2014  . History of colonic polyps 11/21/2014  . Hyperlipidemia   . Knee pain, right 11/21/2014  .  Low back pain 04/03/2015  . Motion sickness 11/21/2014  . 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 Obesity 02/16/2017  . Post-operative nausea and vomiting   . Preventative health care 11/21/2014     Social History   Socioeconomic History  . Marital status: Married    Spouse name: Ronald Griffin  . Number of children: 2  . Years of education: college  . Highest education level: Not on file  Occupational History  . Not on file  Tobacco Use  . Smoking  status: Never Smoker  . Smokeless tobacco: Never Used  Substance and Sexual Activity  . Alcohol use: Yes    Alcohol/week: 3.0 - 5.0 standard drinks    Types: 3 - 5 Standard drinks or equivalent per week    Comment: weekly 3-5 times per pt  . Drug use: No  . Sexual activity: Yes    Comment: lives with wife, works in [employer], part time at [employer], no dietary restrictions, exercises regularly  Other Topics Concern  . Not on file  Social History Narrative   Patient works full time for [employer]. Patient lives at home with his wife Ronald Griffin).   Education college.   Right handed.   Caffeine 4-6 cups daily.    Social Determinants of Health   Financial Resource Strain:   . Difficulty of Paying Living Expenses:   Food Insecurity:   . Worried About Charity fundraiser in the Last Year:   . Arboriculturist in the Last Year:   Transportation Needs:   . Film/video editor (Medical):   Marland Kitchen Lack of Transportation (Non-Medical):   Physical Activity:   . Days of Exercise per Week:   . Minutes of Exercise per Session:   Stress:   . Feeling of Stress :   Social Connections:   . Frequency of Communication with Friends and Family:   . Frequency of Social Gatherings with Friends and Family:   . Attends Religious Services:   . Active Member of Clubs or Organizations:   . Attends Archivist Meetings:   Marland Kitchen Marital Status:   Intimate Partner Violence:   . Fear of Current or Ex-Partner:   . Emotionally Abused:   Marland Kitchen Physically Abused:   . Sexually Abused:     Past Surgical History:  Procedure Laterality Date  . COLONOSCOPY  2012   in [place]    . THORACIC DISCECTOMY Left 02/09/2018   Procedure: Microdiscectomy - Left - Thoracic Seven-Thoracid Eight - Thoracic Eight - Thoracic Nine;  Surgeon: Earnie Larsson, MD;  Location: [place];  Service: Neurosurgery;  Laterality: Left;  Microdiscectomy - Left - Thoracic Seven-Thoracid Eight - Thoracic Eight -  Thoracic Nine  . VARICOCELE EXCISION    . VASECTOMY      Family History  Problem Relation Age of Onset  . Lung cancer Mother   . Breast cancer Mother   . Cancer Mother        breast age 21s, colon late 43s, lung, liver, skin  . Colon cancer Mother 37  . Bipolar disorder Father   . Cancer Father 26       colon cancer  . Dementia Father   . Colon cancer Father 2  . Cancer Maternal Grandmother        ?  Marland Kitchen Heart disease Maternal Grandfather        MI, sudden  . Stroke Paternal Grandmother 47  . Bipolar disorder Paternal  Grandfather   . Dementia Paternal Grandfather   . Cancer Paternal Grandfather        colon  . Colon cancer Paternal Grandfather   . Obesity Brother   . Colon cancer Cousin 34  . Esophageal cancer Neg Hx   . Stomach cancer Neg Hx     No Known Allergies  Current Outpatient Medications on File Prior to Visit  Medication Sig Dispense Refill  . atorvastatin (LIPITOR) 10 MG tablet Take 1 tablet (10 mg total) by mouth at bedtime. (Patient not taking: Reported on 09/15/2019) 30 tablet 3  . cyclobenzaprine (FLEXERIL) 10 MG tablet Take 1 tablet (10 mg total) by mouth 3 (three) times daily as needed for muscle spasms. (Patient not taking: Reported on 03/02/2018) 30 tablet 0   No current facility-administered medications on file prior to visit.    BP 124/80 (BP Location: Left Arm, Patient Position: Sitting, Cuff Size: Large)   Pulse (!) 58   Resp 18   Ht 5\' 10"  (1.778 m)   Wt 238 lb 3.2 oz (108 kg)   SpO2 96%   BMI 34.18 kg/m       Objective:   Physical Exam  General Appearance- Not in acute distress.    Chest and Lung Exam Auscultation: Breath sounds:-Normal. Clear even and unlabored. Adventitious sounds:- No Adventitious sounds.  Cardiovascular Auscultation:Rythm - Regular, rate and rythm. Heart Sounds -Normal heart sounds.  Abdomen Inspection:-Inspection Normal.  Palpation/Perucssion: Palpation and Percussion of the abdomen reveal- Non Tender,  No Rebound tenderness, No rigidity(Guarding) and No Palpable abdominal masses.  Liver:-Normal.  Spleen:- Normal.   Back Pain in region of old scar mid tspine and below. Obvious pain on minimal movement.   Lumbar region pain on twisting and changing positions.   Lower ext neurologic  L5-S1 sensation intact bilaterally. No foot drop bilaterally.         Assessment & Plan:  For chronic t spine and l spine pain, I want you to try 6 day taper prednisone to see if this gives you some relief. Failure of various meds.   I want you to bring [employer] forms to see if we can fill out. Dr. Charlett Blake has seen you more often so will present form to her to review. Might need specialist to fill out.    Continue PT  For concentration difficulty will show Dr. Charlett Blake your attention questioneer answers and see if she wants to refer you for further testing or if she wants to talk with you. Pcp decides on long term controlled med treatment.  For ED rx viagra.  For acne vs rosacea refilled you azelaic acid cream.  Follow up 3 weeks or as needed  Mackie Pai, PA-C   Time spent with patient today was 44  minutes which consisted of chart revdiew, discussing diagnosis, work up treatment and documentation.

## 2019-09-15 NOTE — Patient Instructions (Addendum)
For chronic t spine and l spine pain, I want you to try 6 day taper prednisone to see if this gives you some relief. Failure of various meds.   I want to appeal to [facility] insurance to extend long term disability.   Continue PT  For concentration difficulty will show Dr. Charlett Blake your attention questioneer answers and see if she wants to refer you for further testing or if she wants to talk with you. Pcp decides on long term controlled med treatment.  For ED rx viagra.  For acne vs rosacea refilled you azelaic acid cream.  Follow up 3 weeks or as needed

## 2019-09-16 ENCOUNTER — Telehealth: Payer: Self-pay | Admitting: Medical

## 2019-09-16 MED ORDER — METRONIDAZOLE 0.75 % EX CREA: TOPICAL_CREAM | Freq: Two times a day (BID) | CUTANEOUS | 0 refills | Status: DC

## 2019-09-16 NOTE — Telephone Encounter (Signed)
PA approved.   PA Case: [id number], Status: Approved, Coverage Starts on: 09/15/2019 12:00:00 AM, Coverage Ends on: 09/14/2020 12:00:00 AM.

## 2019-09-16 NOTE — Telephone Encounter (Signed)
Metrocream sent to pt pharmacy.

## 2019-10-04 ENCOUNTER — Ambulatory Visit: Payer: BLUE CROSS/BLUE SHIELD | Admitting: Family Medicine

## 2019-10-04 DIAGNOSIS — Z0289 Encounter for other administrative examinations: Secondary | ICD-10-CM

## 2019-10-07 ENCOUNTER — Telehealth: Payer: Self-pay | Admitting: Family Medicine

## 2019-10-07 NOTE — Telephone Encounter (Signed)
Caller Ronald Griffin Call Back # [phone]   Patient states that he did not anything about appointment.Patient would like no show fee removed .

## 2019-10-12 ENCOUNTER — Encounter: Payer: Self-pay | Admitting: Medical

## 2019-11-04 NOTE — Telephone Encounter (Signed)
Patient has an appointment tomorrow with Dr. Charlett Blake

## 2019-11-04 NOTE — Telephone Encounter (Signed)
Left msg on pt's VM stating we will be happy to take care of the $50 no show fee if he has already received a bill for DOS 10/04/19 or if he is anticipating receiving a bill for that DOS.  I could not see where a bill had been generated and sent to the pt in the billing part of his chart.

## 2019-11-05 ENCOUNTER — Telehealth (INDEPENDENT_AMBULATORY_CARE_PROVIDER_SITE_OTHER): Payer: 59 | Admitting: Family Medicine

## 2019-11-05 ENCOUNTER — Other Ambulatory Visit: Payer: Self-pay

## 2019-11-05 VITALS — Wt 223.4 lb

## 2019-11-05 DIAGNOSIS — E785 Hyperlipidemia, unspecified: Secondary | ICD-10-CM | POA: Diagnosis not present

## 2019-11-05 DIAGNOSIS — N529 Male erectile dysfunction, unspecified: Secondary | ICD-10-CM | POA: Diagnosis not present

## 2019-11-05 DIAGNOSIS — R41840 Attention and concentration deficit: Secondary | ICD-10-CM

## 2019-11-05 DIAGNOSIS — R7989 Other specified abnormal findings of blood chemistry: Secondary | ICD-10-CM

## 2019-11-05 DIAGNOSIS — M5441 Lumbago with sciatica, right side: Secondary | ICD-10-CM

## 2019-11-05 DIAGNOSIS — M5442 Lumbago with sciatica, left side: Secondary | ICD-10-CM | POA: Diagnosis not present

## 2019-11-05 MED ORDER — AMPHETAMINE-DEXTROAMPHET ER 20 MG PO CP24
20.0000 mg | ORAL_CAPSULE | ORAL | 0 refills | Status: DC
Start: 2019-11-05 — End: 2020-02-01

## 2019-11-05 MED ORDER — AZELAIC ACID 15 % EX GEL: CUTANEOUS | 0 refills | Status: DC

## 2019-11-05 MED ORDER — DULOXETINE HCL 30 MG PO CPEP: ORAL_CAPSULE | ORAL | 2 refills | Status: DC

## 2019-11-08 DIAGNOSIS — E039 Hypothyroidism, unspecified: Secondary | ICD-10-CM | POA: Insufficient documentation

## 2019-11-08 NOTE — Assessment & Plan Note (Signed)
Given prescription for Adderall XR and will reevaluate in 1 month.

## 2019-11-08 NOTE — Assessment & Plan Note (Signed)
Continue to monitor

## 2019-11-08 NOTE — Assessment & Plan Note (Addendum)
Continues to struggle with daily pain and has been seen by neurosurgery, Dr Trenton Gammon, He has tried PT but still struggling with pain. Referred to Sports Medicine for further consideration. He is struggling with mid to low back pain which has worsened since he fell in February and broke his 7th right posterior rib and injured back. He has decreased mobility and pain in left leg. Started on Cymbalta 30 mg po daily and then increase to 60 mg as able and as tolerated. Spent 55 minutes discussing case and plan of care with patient

## 2019-11-08 NOTE — Progress Notes (Signed)
Virtual Visit via Video Note  I connected with Ronald Griffin on 11/08/19 at  9:20 AM EDT by a video enabled telemedicine application and verified that I am speaking with the correct person using two identifiers.  Location: Patient: home, patient and provider are set up on video visit Provider: home   I discussed the limitations of evaluation and management by telemedicine and the availability of in person appointments. The patient expressed understanding and agreed to proceed. Kem Boroughs, CMA was able to set up on video visit    Subjective:    Patient ID: Ronald Griffin, male    DOB: Feb 06, 1966, 54 y.o.   MRN: [id number]  Chief Complaint  Patient presents with  . Follow-up    chronic back pain, work note  . Medication Refill    for rosacea med.  may need prior auth pt knows    HPI Patient is in today for follow up on chronic medical concerns. No recent febrile illness or hospitalizations. He is continuing to struggle with back pain and left lower extremity pain and weakness. He has been seen by neurosurgery and had physical therapy but pain is present daily and limits his activity. He has tried to get disability but was turned down. He denies any incontinence. Denies CP/palp/SOB/HA/congestion/fevers/GI or GU c/o. Taking meds as prescribed  Past Medical History:  Diagnosis Date  . Dizziness   . Elevated antinuclear antibody (ANA) level 11/21/2014  . Erectile dysfunction 11/21/2014  . Frequent headaches   . Hand pain, right 02/16/2017  . Headache 03/29/2014  . History of colonic polyps 11/21/2014  . Hyperlipidemia   . Knee pain, right 11/21/2014  . Low back pain 04/03/2015  . Motion sickness 11/21/2014  . 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 Obesity 02/16/2017  . Post-operative nausea and vomiting   . Preventative health care 11/21/2014    Past Surgical History:  Procedure Laterality Date  . COLONOSCOPY  2012   in [place]    . THORACIC DISCECTOMY Left  02/09/2018   Procedure: Microdiscectomy - Left - Thoracic Seven-Thoracid Eight - Thoracic Eight - Thoracic Nine;  Surgeon: Earnie Larsson, MD;  Location: [place];  Service: Neurosurgery;  Laterality: Left;  Microdiscectomy - Left - Thoracic Seven-Thoracid Eight - Thoracic Eight - Thoracic Nine  . VARICOCELE EXCISION    . VASECTOMY      Family History  Problem Relation Age of Onset  . Lung cancer Mother   . Breast cancer Mother   . Cancer Mother        breast age 69s, colon late 24s, lung, liver, skin  . Colon cancer Mother 14  . Bipolar disorder Father   . Cancer Father 27       colon cancer  . Dementia Father   . Colon cancer Father 89  . Cancer Maternal Grandmother        ?  Marland Kitchen Heart disease Maternal Grandfather        MI, sudden  . Stroke Paternal Grandmother 51  . Bipolar disorder Paternal Grandfather   . Dementia Paternal Grandfather   . Cancer Paternal Grandfather        colon  . Colon cancer Paternal Grandfather   . Obesity Brother   . Colon cancer Cousin 29  . Esophageal cancer Neg Hx   . Stomach cancer Neg Hx     Social History   Socioeconomic History  . Marital status: Married    Spouse name: [name]  .  Number of children: 2  . Years of education: college  . Highest education level: Not on file  Occupational History  . Not on file  Tobacco Use  . Smoking status: Never Smoker  . Smokeless tobacco: Never Used  Vaping Use  . Vaping Use: Never used  Substance and Sexual Activity  . Alcohol use: Yes    Alcohol/week: 3.0 - 5.0 standard drinks    Types: 3 - 5 Standard drinks or equivalent per week    Comment: weekly 3-5 times per pt  . Drug use: No  . Sexual activity: Yes    Comment: lives with wife, works in [employer], part time at [employer], no dietary restrictions, exercises regularly  Other Topics Concern  . Not on file  Social History Narrative   Patient works full time for [employer]. [employer]. Patient lives at home with his wife [name]).    Education college.   Right handed.   Caffeine 4-6 cups daily.    Social Determinants of Health   Financial Resource Strain:   . Difficulty of Paying Living Expenses:   Food Insecurity:   . Worried About Charity fundraiser in the Last Year:   . Arboriculturist in the Last Year:   Transportation Needs:   . Film/video editor (Medical):   Marland Kitchen Lack of Transportation (Non-Medical):   Physical Activity:   . Days of Exercise per Week:   . Minutes of Exercise per Session:   Stress:   . Feeling of Stress :   Social Connections:   . Frequency of Communication with Friends and Family:   . Frequency of Social Gatherings with Friends and Family:   . Attends Religious Services:   . Active Member of Clubs or Organizations:   . Attends Archivist Meetings:   Marland Kitchen Marital Status:   Intimate Partner Violence:   . Fear of Current or Ex-Partner:   . Emotionally Abused:   Marland Kitchen Physically Abused:   . Sexually Abused:     Outpatient Medications Prior to Visit  Medication Sig Dispense Refill  . sildenafil (VIAGRA) 25 MG tablet Take 1 tablet (25 mg total) by mouth daily as needed for erectile dysfunction. 10 tablet 0  . atorvastatin (LIPITOR) 10 MG tablet Take 1 tablet (10 mg total) by mouth at bedtime. (Patient not taking: Reported on 09/15/2019) 30 tablet 3  . cyclobenzaprine (FLEXERIL) 10 MG tablet Take 1 tablet (10 mg total) by mouth 3 (three) times daily as needed for muscle spasms. (Patient not taking: Reported on 03/02/2018) 30 tablet 0  . metroNIDAZOLE (METROCREAM) 0.75 % cream Apply topically 2 (two) times daily. 45 g 0  . predniSONE (STERAPRED UNI-PAK 21 TAB) 10 MG (21) TBPK tablet Standard taper over 6 days. 21 tablet 0   No facility-administered medications prior to visit.    No Known Allergies  ROS     Objective:    Physical Exam Constitutional:      Appearance: Normal appearance. He is not ill-appearing.  HENT:     Head: Normocephalic and atraumatic.     Right  Ear: External ear normal.     Left Ear: External ear normal.     Nose: Nose normal.  Eyes:     General:        Right eye: No discharge.        Left eye: No discharge.  Pulmonary:     Effort: Pulmonary effort is normal.  Neurological:     Mental  Status: He is alert and oriented to person, place, and time.  Psychiatric:        Behavior: Behavior normal.     Wt 223 lb 6.4 oz (101.3 kg)   BMI 32.05 kg/m  Wt Readings from Last 3 Encounters:  11/05/19 223 lb 6.4 oz (101.3 kg)  09/15/19 238 lb 3.2 oz (108 kg)  03/02/18 246 lb (111.6 kg)    Diabetic Foot Exam - Simple   No data filed     Lab Results  Component Value Date   WBC 6.3 03/02/2018   HGB 14.6 03/02/2018   HCT 43.0 03/02/2018   PLT 263.0 03/02/2018   GLUCOSE 102 (H) 03/02/2018   CHOL 238 (H) 03/02/2018   TRIG 145.0 03/02/2018   HDL 55.70 03/02/2018   LDLDIRECT 140.0 02/14/2017   LDLCALC 153 (H) 03/02/2018   ALT 21 03/02/2018   AST 17 03/02/2018   NA 140 03/02/2018   K 4.1 03/02/2018   CL 105 03/02/2018   CREATININE 0.89 03/02/2018   BUN 14 03/02/2018   CO2 27 03/02/2018   TSH 4.45 02/14/2017   PSA 0.50 11/21/2014    Lab Results  Component Value Date   TSH 4.45 02/14/2017   Lab Results  Component Value Date   WBC 6.3 03/02/2018   HGB 14.6 03/02/2018   HCT 43.0 03/02/2018   MCV 91.5 03/02/2018   PLT 263.0 03/02/2018   Lab Results  Component Value Date   NA 140 03/02/2018   K 4.1 03/02/2018   CO2 27 03/02/2018   GLUCOSE 102 (H) 03/02/2018   BUN 14 03/02/2018   CREATININE 0.89 03/02/2018   BILITOT 0.6 03/02/2018   ALKPHOS 96 03/02/2018   AST 17 03/02/2018   ALT 21 03/02/2018   PROT 7.2 03/02/2018   ALBUMIN 4.7 03/02/2018   CALCIUM 9.6 03/02/2018   ANIONGAP 7 02/09/2018   GFR 94.63 03/02/2018   Lab Results  Component Value Date   CHOL 238 (H) 03/02/2018   Lab Results  Component Value Date   HDL 55.70 03/02/2018   Lab Results  Component Value Date   LDLCALC 153 (H) 03/02/2018    Lab Results  Component Value Date   TRIG 145.0 03/02/2018   Lab Results  Component Value Date   CHOLHDL 4 03/02/2018   No results found for: HGBA1C     Assessment & Plan:   Problem List Items Addressed This Visit    Hyperlipidemia   Relevant Orders   Lipid panel   Erectile dysfunction - Primary   Relevant Orders   PSA   Low back pain    Continues to struggle with daily pain and has been seen by neurosurgery, Dr Trenton Gammon, He has tried PT but still struggling with pain. Referred to Sports Medicine for further consideration. He is struggling with mid to low back pain which has worsened since he fell in February and broke his 7th right posterior rib and injured back. He has decreased mobility and pain in left leg. Started on Cymbalta 30 mg po daily and then increase to 60 mg as able and as tolerated. Spent 55 minutes discussing case and plan of care with patient      Relevant Orders   Ambulatory referral to Sports Medicine   CBC   Comprehensive metabolic panel   Attention and concentration deficit    Given prescription for Adderall XR and will reevaluate in 1 month.       Abnormal TSH    Continue to monitor  Relevant Orders   TSH      I have discontinued Pablo Ledger. Bahe's cyclobenzaprine, atorvastatin, predniSONE, and metroNIDAZOLE. I am also having him start on DULoxetine and amphetamine-dextroamphetamine. Additionally, I am having him maintain his sildenafil and Azelaic Acid.  Meds ordered this encounter  Medications  . Azelaic Acid 15 % cream    Sig: After skin is thoroughly washed and patted dry, gently but thoroughly massage a thin film of azelaic acid cream into the affected area twice daily, in the morning and evening.    Dispense:  30 g    Refill:  0  . DULoxetine (CYMBALTA) 30 MG capsule    Sig: 30 mg po daily x 7 days then increase to 60 mg po daily    Dispense:  60 capsule    Refill:  2  . amphetamine-dextroamphetamine (ADDERALL XR) 20 MG 24 hr  capsule    Sig: Take 1 capsule (20 mg total) by mouth every morning.    Dispense:  30 capsule    Refill:  0    I discussed the assessment and treatment plan with the patient. The patient was provided an opportunity to ask questions and all were answered. The patient agreed with the plan and demonstrated an understanding of the instructions.   The patient was advised to call back or seek an in-person evaluation if the symptoms worsen or if the condition fails to improve as anticipated.  I provided 55 minutes of non-face-to-face time during this encounter.   Penni Homans, MD

## 2019-11-09 ENCOUNTER — Encounter: Payer: Self-pay | Admitting: *Deleted

## 2019-11-09 ENCOUNTER — Other Ambulatory Visit: Payer: Self-pay

## 2019-11-09 ENCOUNTER — Other Ambulatory Visit (INDEPENDENT_AMBULATORY_CARE_PROVIDER_SITE_OTHER): Payer: 59

## 2019-11-09 DIAGNOSIS — M5442 Lumbago with sciatica, left side: Secondary | ICD-10-CM | POA: Diagnosis not present

## 2019-11-09 DIAGNOSIS — M5441 Lumbago with sciatica, right side: Secondary | ICD-10-CM

## 2019-11-09 DIAGNOSIS — R7989 Other specified abnormal findings of blood chemistry: Secondary | ICD-10-CM

## 2019-11-09 DIAGNOSIS — N529 Male erectile dysfunction, unspecified: Secondary | ICD-10-CM

## 2019-11-09 DIAGNOSIS — E785 Hyperlipidemia, unspecified: Secondary | ICD-10-CM

## 2019-11-09 LAB — COMPREHENSIVE METABOLIC PANEL
ALT: 19 U/L (ref 0–53)
AST: 16 U/L (ref 0–37)
Albumin: 4.6 g/dL (ref 3.5–5.2)
Alkaline Phosphatase: 64 U/L (ref 39–117)
BUN: 16 mg/dL (ref 6–23)
CO2: 28 mEq/L (ref 19–32)
Calcium: 9.1 mg/dL (ref 8.4–10.5)
Chloride: 106 mEq/L (ref 96–112)
Creatinine, Ser: 0.86 mg/dL (ref 0.40–1.50)
GFR: 92.06 mL/min (ref >60.00)
Glucose, Bld: 93 mg/dL (ref 70–99)
Potassium: 4.4 mEq/L (ref 3.5–5.1)
Sodium: 139 mEq/L (ref 135–145)
Total Bilirubin: 1.2 mg/dL (ref 0.2–1.2)
Total Protein: 6.9 g/dL (ref 6.0–8.3)

## 2019-11-09 LAB — LIPID PANEL
Cholesterol: 206 mg/dL — ABNORMAL HIGH (ref 0–200)
HDL: 54.2 mg/dL (ref >39.00)
LDL Cholesterol: 131 mg/dL — ABNORMAL HIGH (ref 0–99)
NonHDL: 151.85
Total CHOL/HDL Ratio: 4
Triglycerides: 104 mg/dL (ref 0.0–149.0)
VLDL: 20.8 mg/dL (ref 0.0–40.0)

## 2019-11-09 LAB — CBC
HCT: 42.9 % (ref 39.0–52.0)
Hemoglobin: 14.6 g/dL (ref 13.0–17.0)
MCHC: 34 g/dL (ref 30.0–36.0)
MCV: 91.8 fl (ref 78.0–100.0)
Platelets: 199 10*3/uL (ref 150.0–400.0)
RBC: 4.67 Mil/uL (ref 4.22–5.81)
RDW: 13.7 % (ref 11.5–15.5)
WBC: 5.4 10*3/uL (ref 4.0–10.5)

## 2019-11-09 LAB — PSA: PSA: 0.61 ng/mL (ref 0.10–4.00)

## 2019-11-09 LAB — TSH: TSH: 4.41 u[IU]/mL (ref 0.35–4.50)

## 2019-11-10 ENCOUNTER — Encounter: Payer: Self-pay | Admitting: *Deleted

## 2019-11-19 ENCOUNTER — Encounter: Payer: Self-pay | Admitting: Family Medicine

## 2019-11-19 ENCOUNTER — Ambulatory Visit (INDEPENDENT_AMBULATORY_CARE_PROVIDER_SITE_OTHER): Payer: 59 | Admitting: Family Medicine

## 2019-11-19 ENCOUNTER — Other Ambulatory Visit: Payer: Self-pay

## 2019-11-19 VITALS — BP 108/80 | HR 58 | Ht 70.0 in | Wt 222.6 lb

## 2019-11-19 DIAGNOSIS — G8929 Other chronic pain: Secondary | ICD-10-CM | POA: Diagnosis not present

## 2019-11-19 DIAGNOSIS — M546 Pain in thoracic spine: Secondary | ICD-10-CM

## 2019-11-19 DIAGNOSIS — M5104 Intervertebral disc disorders with myelopathy, thoracic region: Secondary | ICD-10-CM

## 2019-11-19 MED ORDER — PREGABALIN 75 MG PO CAPS: 75.0000 mg | ORAL_CAPSULE | Freq: Two times a day (BID) | ORAL | 3 refills | Status: DC | PRN

## 2019-11-19 NOTE — Progress Notes (Signed)
Subjective:    I'm seeing this patient as a consultation for:  Dr. Charlett Blake. Note will be routed back to referring provider/PCP.  CC: Low back pain and LE paresthesias  I, Molly Weber, LAT, ATC, am serving as scribe for Dr. Lynne Leader.  HPI: Pt is a 54 y/o male presenting w/ c/o low back pain and LE pain/weakness/paresthesias.  He has a hx of a LT7-T8 and L T8-9 laminotomy performed by Dr. Trenton Gammon on 02/09/18 and has more recently completed a course of PT that finished in Oct 2020.  He reports worsening back pain since his surgery.  Radiating pain: yes into B LEs, L>R described as a burning numbness LE numbness/tingling: yes into B LEs LE weakness: yes but no specific muscle group noted; difficulty w/ transitioning from floor to stand Aggravating factors: reaching w/ trunk rotation; trunk rotation; transitions; stairs Treatments tried: Gabapentin but doesn't help; PT in 2020; Adderal; Cymbalta prescribed but not taking yet   Diagnostic testing: TL spine XR- 02/09/18; T-spine MRI- 10/01/17; L-spine MRI- 08/20/17  Past medical history, Surgical history, Family history, Social history, Allergies, and medications have been entered into the medical record, reviewed.   Review of Systems: No new headache, visual changes, nausea, vomiting, diarrhea, constipation, dizziness, abdominal pain, skin rash, fevers, chills, night sweats, weight loss, swollen lymph nodes, body aches, joint swelling, muscle aches, chest pain, shortness of breath, mood changes, visual or auditory hallucinations.   Objective:    Vitals:   11/19/19 0805  BP: 108/80  Pulse: 58  SpO2: 97%   General: Well Developed, well nourished, and in no acute distress.  Neuro/Psych: Alert and oriented x3, extra-ocular muscles intact, able to move all 4 extremities, sensation grossly intact. Skin: Warm and dry, no rashes noted.  Respiratory: Not using accessory muscles, speaking in full sentences, trachea midline.  Cardiovascular: Pulses  palpable, no extremity edema. Abdomen: Does not appear distended. MSK: C-spine normal-appearing nontender midline.  Tender palpation bilateral thoracic paraspinal musculature. Lower extremity normal-appearing Hyperesthesia to touch bilaterally throughout multiple dermatomes. Strength intact.  Reflexes equal and normal bilaterally.  Lab and Radiology Results MRI T-spine May 2020 Impression  :  1. Sequela of left T7-T8 and T8-T9 laminotomies, by report for decompression of the spinal cord. No significant spinal canal compromise or evidence of spinal cord compression. Abnormal signal within the left eccentric dorsal spinal cord tapering superiorly and inferiorly with suggestion of subtle associated volume loss likely reflects myelomalacia given history of cord compression. Signal abnormality described on prior MR at the T8-T9 level. Comparison with priors may be useful for further characterization. These findings were discussed with Dr. Annette Stable at approximately 10:40 on 09/08/2018.  2. 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 Images not available at the time of interpretation.   TECHNIQUE: Multiplanar, multisequence surface-coil magnetic resonance imaging of the thoracic spine was performed without contrast.   LEVELS IMAGED: Lower cervical to upper lumbar spine.   FINDINGS:   There is abnormal T2 signal within the left eccentric dorsal spinal cord most prominentlyat the T6-T7 level, extending inferiorly to approximately the T8-T9 level and possibly, tapering superiorly, extending possibly as high as the T1 level. No definite mass effect. Abnormal  heterogeneous T2 hyperintensity involving the left T8  articular pillar and T9 superior articular facet with adjacent prominent T1 hypointensity within the left T7-T8 and T8-T9 facet joints extending into the adjacent articular pillars and superior aspect of the left T7-T8 and T8-T9 neural foramina likely reflects sequela of laminotomies.   Moderate disc desiccation and loss of disc space height throughout the thoracic spine. Small left eccentric central T2-T3 disc protrusion minimally deforms the ventral thecal sac. Small left subarticular T3-T4 disc protrusion mildly effaces the left subarticular recess. Moderate left eccentric central and left subarticular T4-T5 disc protrusion contributes to moderate effacement left subarticular recess and contacts and mildly deforms the ventral spinal cord. Small left eccentric central and left subarticular T5-T6 disc protrusion contacts and mildly deforms the left eccentric ventral spinal cord and contributes to mild effacement of the left subarticular recess. Moderate Schmorl's node within the left eccentric posterior T7 inferior endplate with adjacent small left eccentric central disc protrusion. Moderate right eccentric central and right subarticular T8-T9 disc protrusion moderately effaces the right subarticular recess and exerts mild mass effect on the right eccentric ventral spinal cord. . Multilevel mild to moderate thoracic spondylosis contributes to multilevel mild neural foraminal stenosis without significant spinal canal compromise.   Impression and Recommendations:    Assessment and Plan: 54 y.o. male with chronic back and bilateral lower leg pain.  He has chronic thoracic back pain from postsurgical pain as well as muscle dysfunction and spasm.  This is worthwhile to try to treat with physical therapy again.  Is never had dry needling which may be effective.  Refer to PT.  Additionally has painful paresthesia and sensitivity/hyperalgia bilateral lower  extremities.  This is probably from the cord compression and cord injury seen on previous MRI.  Has had trials of gabapentin with little benefit.  We will try Lyrica.  Agree with PCPs trial of Cymbalta as well.  Will consider repeat MRI thoracic and lumbar spine in the future if not improving.  Also will consider three-phase bone scan of his lower extremities to evaluate for complex regional pain syndrome which certainly is a possibility here as well.   Orders Placed This Encounter  Procedures  . Ambulatory referral to Physical Therapy    Referral Priority:   Routine    Referral Type:   Physical Medicine    Referral Reason:   Specialty Services Required    Requested Specialty:   Physical Therapy   Meds ordered this encounter  Medications  . pregabalin (LYRICA) 75 MG capsule    Sig: Take 1-2 capsules (75-150 mg total) by mouth 2 (two) times daily as needed (nerve pain).    Dispense:  120 capsule    Refill:  3    Discussed warning signs or symptoms. Please see discharge instructions. Patient expresses understanding.   The above documentation has been reviewed and is accurate and complete Lynne Leader, M.D.

## 2019-11-19 NOTE — Patient Instructions (Addendum)
Thank you for coming in today. I recommend starting cymbata as prescribed by Dr Charlett Blake.  Start lyrica for nerve pain.  Let me know how these two medicines work.  If not doing well I will arrange for a 3 phase bone scan to evaluate for CRPS.   Plan for PT.   Complex Regional Pain Syndrome Complex regional pain syndrome (CRPS) is a nerve disorder that causes long-term (chronic) pain. This is usually in a hand, arm, foot, or leg. CRPS usually occurs after an injury or trauma, such as a fracture or sprain. There are two types of CRPS:  Type 1. This type occurs after an injury with no known damage to a nerve.  Type 2. This type occurs after an injury that damages a nerve. There are three stages of the condition:  Stage 1. This stage, called the acute stage, may last for up to 3 months.  Stage 2. This stage, called the dystrophic stage, may last for 3-12 months.  Stage 3. This stage, called the atrophic stage, may start after one year. CRPS ranges from mild to severe. For most people, CRPS is mild and recovery happens over time. For others, CRPS lasts for a very long time and makes everyday tasks hard to do. What are the causes? The exact cause of this condition is not known. It is usually triggered by an injury. What increases the risk? You are more likely to develop this condition if:  You are male.  You have any of the following injuries: ? A wrist fracture that involves a lower arm bone (distal radius fracture). ? Ankle dislocation or fracture. ? A long surgery time. ? Possible nerve injury during surgery. What are the signs or symptoms? Signs and symptoms in the affected hand, arm, foot, or leg are different for each stage. Signs and symptoms of stage 1 include:  Burning pain.  A prickling, tingling feeling (pins and needles sensation).  Extremely sensitive skin.  Swelling.  Joint stiffness.  Warmth and redness.  Excessive sweating.  Hair and nail growth that is  faster than normal. Signs and symptoms of stage 2 include:  Spreading of pain to the whole arm or leg.  Increased skin sensitivity.  Increased swelling and stiffness.  Coolness of the skin.  Blue discoloration of skin.  Loss of skin wrinkles.  Brittle fingernails. Signs and symptoms of stage 3 include:  Pain that spreads to other areas of the body but becomes less severe.  More stiffness, leading to loss of motion.  Skin that is pale, dry, shiny, or tightly stretched. How is this diagnosed? This condition may be diagnosed based on:  Your signs and symptoms.  A physical exam. There is no test to diagnose CRPS, but you may have tests:  To check for bone changes that might indicate CRPS. These tests may include an MRI or bone scan.  To rule out other possible causes of your symptoms. How is this treated? Early treatment may prevent CRPS from advancing past stage 1. There is not one treatment that works for everyone. Treatment options may include:  Medicines, which may include: ? NSAIDs, such as ibuprofen. ? Steroids. ? Blood pressure drugs. ? Antidepressants. ? Anti-seizure drugs. ? Pain relievers.  Exercise.  Occupational therapy (OT) and physical therapy (PT).  Biofeedback.  Mental health counseling.  Numbing injections.  Spinal surgery to implant a spinal cord stimulator or a pain pump. Follow these instructions at home: Medicines  Take over-the-counter and prescription medicines only as told  by your health care provider.  Do not drive or use heavy machinery while taking prescription pain medicine.  If you are taking prescription pain medicine, take actions to prevent or treat constipation. Your health care provider may recommend that you: ? Drink enough fluid to keep your urine pale yellow. ? Eat foods that are high in fiber, such as fresh fruits and vegetables, whole grains, and beans. ? Limit foods that are high in fat and processed sugars, such  as fried or sweet foods. ? Take an over-the-counter or prescription medicine for constipation. General instructions  Do not use any products that contain nicotine or tobacco, such as cigarettes and e-cigarettes. If you need help quitting, ask your health care provider.  Maintain a healthy weight.  Return to your normal activities as told by your health care provider. Ask your health care provider what activities are safe for you.  Follow an exercise program as directed by your health care provider.  Keep all follow-up visits as told by your health care provider. This is important. Contact a health care provider if:  Your symptoms change.  Your symptoms get worse.  You develop anxiety or depression. Summary  Complex regional pain syndrome (CRPS) is a nerve disorder that causes long-term (chronic) pain, usually in a hand, arm, leg, or foot.  CRPS usually occurs after an injury or trauma, such as a fracture or sprain.  CRPS ranges from mild to severe. Early treatment may prevent CRPS from advancing to more severe stages. This information is not intended to replace advice given to you by your health care provider. Make sure you discuss any questions you have with your health care provider. Document Revised: 03/28/2017 Document Reviewed: 03/10/2017 Elsevier Patient Education  2020 [employer].

## 2019-12-02 ENCOUNTER — Telehealth: Payer: Self-pay | Admitting: Family Medicine

## 2019-12-02 MED ORDER — AZELEX 20 % EX CREA
TOPICAL_CREAM | Freq: Two times a day (BID) | CUTANEOUS | 0 refills | Status: DC
Start: 2019-12-02 — End: 2019-12-07

## 2019-12-02 NOTE — Telephone Encounter (Signed)
Clarification on Azelaic Acid 15 % cream need please  15% come in gel only  20% comes in cream   Please advise, what they provider wants.

## 2019-12-02 NOTE — Telephone Encounter (Signed)
Change to 20% cream same sig

## 2019-12-02 NOTE — Telephone Encounter (Signed)
New rx sent in and patient wife notified.

## 2019-12-03 ENCOUNTER — Telehealth: Payer: Self-pay

## 2019-12-03 NOTE — Telephone Encounter (Signed)
PA approved.   PA Case: [id number], Status: Approved, Coverage Starts on: 12/03/2019 12:00:00 AM, Coverage Ends on: 12/02/2020 12:00:00 AM.

## 2019-12-03 NOTE — Telephone Encounter (Signed)
PA initiated via Covermymeds; KEY: [id number]. Awaiting determination.

## 2019-12-07 ENCOUNTER — Telehealth: Payer: Self-pay | Admitting: Family Medicine

## 2019-12-07 MED ORDER — AZELEX 20 % EX CREA: TOPICAL_CREAM | Freq: Two times a day (BID) | CUTANEOUS | 0 refills | Status: DC

## 2019-12-07 NOTE — Telephone Encounter (Signed)
Caller : [facility]  Call Back [phone]  Per [facility] azelaic acid (AZELEX) 20 % cream [[id number]]  Requires a prior auth.   Patient bought in a paper script for 15% and it dose not in as a cream. Only gel.   Please Advise

## 2019-12-07 NOTE — Telephone Encounter (Signed)
Rx sent 

## 2019-12-08 ENCOUNTER — Ambulatory Visit (INDEPENDENT_AMBULATORY_CARE_PROVIDER_SITE_OTHER): Payer: 59 | Admitting: Rehabilitative and Restorative Service Providers"

## 2019-12-08 ENCOUNTER — Other Ambulatory Visit: Payer: Self-pay

## 2019-12-08 ENCOUNTER — Encounter: Payer: Self-pay | Admitting: Rehabilitative and Restorative Service Providers"

## 2019-12-08 DIAGNOSIS — R29898 Other symptoms and signs involving the musculoskeletal system: Secondary | ICD-10-CM

## 2019-12-08 DIAGNOSIS — M546 Pain in thoracic spine: Secondary | ICD-10-CM

## 2019-12-08 DIAGNOSIS — R293 Abnormal posture: Secondary | ICD-10-CM

## 2019-12-08 NOTE — Therapy (Addendum)
[facility] [place], [place], [address] Phone: [phone]   Fax:  [phone]  Physical Therapy Evaluation  Patient Details  Name: Ronald Griffin MRN: [id number] Date of Birth: 1965-09-07 Referring Provider (PT): Dr Lynne Leader    Encounter Date: 12/08/2019   PT End of Session - 12/08/19 0908    Visit Number 1    Number of Visits 12    Date for PT Re-Evaluation 01/19/20    PT Start Time 0805    PT Stop Time 0900    PT Time Calculation (min) 55 min    Activity Tolerance Patient tolerated treatment well;Patient limited by pain           Past Medical History:  Diagnosis Date  . Dizziness   . Elevated antinuclear antibody (ANA) level 11/21/2014  . Erectile dysfunction 11/21/2014  . Frequent headaches   . Hand pain, right 02/16/2017  . Headache 03/29/2014  . History of colonic polyps 11/21/2014  . Hyperlipidemia   . Knee pain, right 11/21/2014  . Low back pain 04/03/2015  . Motion sickness 11/21/2014  . Myelopathy ([place])    thoracic  . Obesity 02/16/2017  . Post-operative nausea and vomiting   . Preventative health care 11/21/2014    Past Surgical History:  Procedure Laterality Date  . COLONOSCOPY  2012   in [place]    . THORACIC DISCECTOMY Left 02/09/2018   Procedure: Microdiscectomy - Left - Thoracic Seven-Thoracid Eight - Thoracic Eight - Thoracic Nine;  Surgeon: Earnie Larsson, MD;  Location: [facility];  Service: Neurosurgery;  Laterality: Left;  Microdiscectomy - Left - Thoracic Seven-Thoracid Eight - Thoracic Eight - Thoracic Nine  . VARICOCELE EXCISION    . VASECTOMY      There were no vitals filed for this visit.    Subjective Assessment - 12/08/19 0808    Subjective Patient reports that he has pain in the mid back running down to his toes. Pain is greatest in the Lt than Rt. He occasionally has knee pain and hip pain. Back pain has progressively worsened since surgery 02/09/18 for cord  decompression Thoracic  T7/8/9. Fx Rt rib 2/21 from fall on steps    Pertinent History unremarkable per pt report    Patient Stated Goals get better - less pain and improving numbness and stiffness without use of medication    Currently in Pain? Yes    Pain Score 8     Pain Location Thoracic    Pain Orientation Mid;Left;Right   Lt > Rt   Pain Descriptors / Indicators Stabbing;Constant    Pain Type Chronic pain    Pain Radiating Towards mid back to low back to LE's    Pain Onset More than a month ago    Pain Frequency Constant    Aggravating Factors  bending; twisting; reaching; stretching    Pain Relieving Factors nothing              [facility] PT Assessment - 12/08/19 0001      Assessment   Medical Diagnosis Chronic bilat thoracic back pain     Referring Provider (PT) Dr Lynne Leader     Onset Date/Surgical Date 08/02/17   cord compression - fell on steps falling back onto spine    Hand Dominance Right    Next MD Visit 12/27/19    Prior Therapy yes       Precautions   Precautions None  Restrictions   Weight Bearing Restrictions No      Balance Screen   Has the patient fallen in the past 6 months Yes    How many times? 1   fell on steps fx  7th Rt rib    Has the patient had a decrease in activity level because of a fear of falling?  No    Is the patient reluctant to leave their home because of a fear of falling?  No      Home Ecologist residence    Living Arrangements Spouse/significant other      Prior Function   Level of Independence Independent    Vocation On disability   filing for ss disability    Ecologist     Leisure some light household chores and yard work       Observation/Other Assessments   Focus on Therapeutic Outcomes (FOTO)  71% limitation       Sensation   Additional Comments numbness and tingling in the Lt > Rt LE's - whole leg - worse in lower leg than thigh       Posture/Postural  Control   Posture Comments head forwardd; shoulders rounded and elevated;incresed thoracic kyphosis; decreased lumbar lordosis; flexed forward at hips       AROM   Lumbar Flexion 50%   walking down legs with UE's - flex hips to stand    Lumbar Extension 10%    tightness and painful mvt lumbar min mvt thoracic    Lumbar - Right Side Bend 70% pain Lt     Lumbar - Left Side Bend 50% pain Lt with Lt LE pain     Lumbar - Right Rotation 30% pain bilat Rt LB     Lumbar - Left Rotation 15% pain mid to low pain       Strength   Right/Left Hip --   functional strength bilat LE's to MMT      Flexibility   Hamstrings tight Rt 75 deg; Lt 60 deg    Quadriceps tight Lt > Rt    ITB tight Lt > Rt     Piriformis tight Rt > Lt       Palpation   Spinal mobility hypomobility with PA and lateral mobs mid thoracic to lumbar spine greatest through mid thoracic spine     Palpation comment muscular tightness Lt > Rt hip flexors/psops; thoracic and lumbar paraspinals; lats; QL; posterior hip piriformis and gluts       Balance   Balance Assessed --   SLS Rt ~ 7 sec; Lt 2-3 sec                      Objective measurements completed on examination: See above findings.       [facility] Adult PT Treatment/Exercise - 12/08/19 0001      Therapeutic Activites    Therapeutic Activities --   to begin daily walking program ~ 5 min progressing as tol      Neuro Re-ed    Neuro Re-ed Details  initiated postural correction/education       Lumbar Exercises: Stretches   Standing Extension 3 reps   2-3 sec    Prone on Elbows Stretch 1 rep;60 seconds                  PT Education - 12/08/19 0857    Education Details HEP DN POC TENS    Person(s)  Educated Patient    Methods Explanation;Demonstration;Tactile cues;Verbal cues;Handout    Comprehension Verbalized understanding;Returned demonstration;Verbal cues required;Tactile cues required               PT Long Term Goals - 12/08/19 0917       PT LONG TERM GOAL #1   Title Improve posture and alignment with patient to demonstrate increased spinal extension through the thoracic and lumbar spine    Time 6    Period Weeks    Status New    Target Date 01/19/20      PT LONG TERM GOAL #2   Title Increase lumbar mobility and ROM with patient to demonstrate 75% lumbar flexion; 50 % lumbar extension and rotation    Time 6    Period Weeks    Status New    Target Date 01/19/20      PT LONG TERM GOAL #3   Title Patient will demonstrate tolerance of  30-45 min of aquatic therapy to improve mobility and endurance    Time 6    Period Weeks    Status New    Target Date 10/19/19      PT LONG TERM GOAL #4   Title Independent in HEP    Time 6    Period Weeks    Status New    Target Date 01/19/20      PT LONG TERM GOAL #5   Title Improve FOTO to </= 56% limitation    Time 6    Period Weeks    Status New    Target Date 01/19/20                  Plan - 12/08/19 0910    Clinical Impression Statement Patient presents with history of chronic pain following fall onto thoracicspine 08/03/19. He underwent cord decompression surgery 02/10/20 and reports that his symptoms have increased since the time of surgery Patient has poor posture and alignment; limited trunk and LE mobility; decreased spinal mobility in all planes of motion; muscular tightness to palpation; sedentary lifestyle; decreased tolerance for ADL's and functional activities; inability to work. Patient will beenfit from trial of PT to address problems identified. Progress is anticipated to be slow due to longstanding nature of pain and dysfunction as well as decreased activity level.    Personal Factors and Comorbidities Behavior Pattern;Comorbidity 1    Comorbidities chronic pain; post spine surgery for cord compression    Examination-Activity Limitations Bend;Carry;Lift;Squat;Stand;Transfers    Examination-Participation Restrictions Cleaning;Driving;Laundry;Yard  Work;Other   unable to work   Stability/Clinical Decision Making Evolving/Moderate complexity    Clinical Decision Making Moderate    Rehab Potential Good    PT Frequency 2x / week    PT Duration 6 weeks    PT Treatment/Interventions Patient/family education;ADLs/Self Care Home Management;Aquatic Therapy;Cryotherapy;Electrical Stimulation;Iontophoresis '4mg'$ /ml Dexamethasone;Moist Heat;Ultrasound;Functional mobility training;Therapeutic activities;Therapeutic exercise;Balance training;Neuromuscular re-education;Manual techniques;Dry needling;Taping    PT Next Visit Plan review HEP; discuss walking program; progress with general stretching program with focus on spinal extension; trial of DN; trial of aquatic therapy; continue education; F/u on TENS purchase for home    PT Home Exercise Plan [email or web address]    Consulted and Agree with Plan of Care Patient           Patient will benefit from skilled therapeutic intervention in order to improve the following deficits and impairments:  Decreased range of motion, Difficulty walking, Increased fascial restricitons, Increased muscle spasms, Pain, Impaired flexibility, Improper body mechanics, Hypomobility, Decreased mobility, Decreased strength,  Postural dysfunction  Visit Diagnosis: Pain in thoracic spine - Plan: PT plan of care cert/re-cert  Other symptoms and signs involving the musculoskeletal system - Plan: PT plan of care cert/re-cert  Abnormal posture - Plan: PT plan of care cert/re-cert     Problem List Patient Active Problem List   Diagnosis Date Noted  . Abnormal TSH 11/08/2019  . Memory changes 03/03/2018  . Attention and concentration deficit 03/03/2018  . 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 Colon cancer screening 02/16/2017  . Obesity 02/16/2017  . Hand pain, right 02/16/2017  . Low back pain 04/03/2015  . Chronic right shoulder pain 04/03/2015  . Preventative health care 11/21/2014  .  History of colonic polyps 11/21/2014  . Erectile dysfunction 11/21/2014  . Motion sickness 11/21/2014  . Knee pain, right 11/21/2014  . Elevated antinuclear antibody (ANA) level 11/21/2014  . Hyperlipidemia     Naveh Rickles Nilda Simmer 12/08/2019, 9:25 AM  [facility] [place], [place], [address] Phone: [phone]   Fax:  [phone]  Name: Ronald Griffin MRN: [id number] Date of Birth: 1965/05/14   PHYSICAL THERAPY DISCHARGE SUMMARY  Visits from Start of Care: Evaluation Only   Current functional level related to goals / functional outcomes: See evaluation    Remaining deficits: Unknown    Education / Equipment: HEP  Plan: Patient agrees to discharge.  Patient goals were not met. Patient is being discharged due to not returning since the last visit.  ?????     Hellon Vaccarella P. Helene Kelp PT, MPH 02/09/20 10:59 AM

## 2019-12-08 NOTE — Patient Instructions (Addendum)
Access Code: [email or web address]: [email or web address]: 08/11/2021Prepared by: Trashaun Streight HoltExercises  Static Prone on Elbows - 2 x daily - 7 x weekly - 1 sets - 3 reps - 60 sec hold  Standing Lumbar Extension - 2 x daily - 7 x weekly - 1 sets - 2-3 reps - 2-3 sec hold Patient Education  Trigger Point Dry Needling     Aquatic Therapy: What to Expect!  Where:  [address] [place] [place], [place]  [address] [phone]    NOTE: Ronald Griffin will receive an automated phone message  reminding you of your appointment and it will say the   appointment is at [facility] in [place]. We are  working to fix this - just know that you will meet [place] at pool!                                 How to Prepare: . Please make sure you drink 8 ounces of water about one hour prior to your pool session. . Sign in at the front desk upon your arrival.   . If you have a caregiver, they must attend the entire session with you (unless your primary therapists feels this is not necessary). The caregiver will be responsible for assisting with dressing as well as any toileting needs.  . Please arrive IN YOUR SUIT and a few minutes prior to your appointment - this helps to avoid delays in starting your session. . Please make sure to attend to any toileting needs prior to entering the pool. . You can bring your pool bag with you and place it on bench near our work space.   . Once on the pool deck, your therapist will ask you to sign the Patient  Consent and Assignment of Benefits form. . Your therapist may take your blood pressure prior to, during, and after your session if indicated. . We usually try and create a home exercise program based on activities we do in the pool.  Please be thinking about who might be able to assist you in the pool should you want to participate in an aquatic home exercise program at the time of discharge.  Some patients do not want to or do not have the  ability to participate in an aquatic home program - this is not a barrier in any way to you participating in aquatic therapy as part of your current therapy plan!    About the pool: 1. Entering the pool: Your therapist will assist you; there are multiple ways to enter including stairs with railings, a walk-in ramp, a roll-in chair and a mechanical lift. Your therapist will determine the most appropriate way for you. 2. Water temperature is usually between 86-87 degrees. 3. There may be other swimmers in the pool at the same time. 4. All sessions are 45 minutes.     Contact Info:  [facility]  [address] [place], [place] [address]                   Please call our [facility] office if you need to cancel or reschedule.   [phone]      TENS UNIT: This is helpful for muscle pain and spasm.   Search and Purchase a TENS 7000 2nd edition at www.tenspros.com. It should be less than $30.     TENS unit instructions: Do not shower  or bathe with the unit on Turn the unit off before removing electrodes or batteries If the electrodes lose stickiness add a drop of water to the electrodes after they are disconnected from the unit and place on plastic sheet. If you continued to have difficulty, call the TENS unit company to purchase more electrodes. Do not apply lotion on the skin area prior to use. Make sure the skin is clean and dry as this will help prolong the life of the electrodes. After use, always check skin for unusual red areas, rash or other skin difficulties. If there are any skin problems, does not apply electrodes to the same area. Never remove the electrodes from the unit by pulling the wires. Do not use the TENS unit or electrodes other than as directed. Do not change electrode placement without consultating your therapist or physician. Keep 2 fingers with between each electrode.

## 2019-12-27 ENCOUNTER — Ambulatory Visit: Payer: 59 | Admitting: Family Medicine

## 2020-01-05 ENCOUNTER — Telehealth: Payer: Self-pay | Admitting: Family Medicine

## 2020-01-05 NOTE — Telephone Encounter (Signed)
Have done PA awaiting insurance determination

## 2020-01-05 NOTE — Telephone Encounter (Signed)
Patient called stating that the pregabalin (LYRICA) 75 MG capsule is requiring a PA. He asked if this could be started.

## 2020-01-07 ENCOUNTER — Telehealth (INDEPENDENT_AMBULATORY_CARE_PROVIDER_SITE_OTHER): Payer: 59 | Admitting: Family Medicine

## 2020-01-07 ENCOUNTER — Other Ambulatory Visit: Payer: Self-pay

## 2020-01-07 DIAGNOSIS — E785 Hyperlipidemia, unspecified: Secondary | ICD-10-CM | POA: Diagnosis not present

## 2020-01-07 DIAGNOSIS — F819 Developmental disorder of scholastic skills, unspecified: Secondary | ICD-10-CM | POA: Diagnosis not present

## 2020-01-07 DIAGNOSIS — M5104 Intervertebral disc disorders with myelopathy, thoracic region: Secondary | ICD-10-CM

## 2020-01-07 DIAGNOSIS — R41840 Attention and concentration deficit: Secondary | ICD-10-CM

## 2020-01-07 MED ORDER — AZELEX 20 % EX CREA: TOPICAL_CREAM | Freq: Two times a day (BID) | CUTANEOUS | 0 refills | Status: DC

## 2020-01-07 NOTE — Assessment & Plan Note (Addendum)
He struggles with daily pain in his back and down his legs. He notes significant flares in paresthesias with burning of skin, when ever he tries to wear pants he has burning down both legs. If he does any physical labor his pain quickly escalated. On a good day without any flaring or over exertion pain is at a 6.5 of 10 and with exertion or on a bad day pain approaches 8 of 10. The burning is present separately and stays constant at 9 of 10 and with any pressure or touch such as putting on pants will escalate to 10/10. He has been unable to work since August 02, 2017 due to his injury and pain. Since his symptoms have escalated some but Lyrica helps some. He is following with Sports medicine and agrees to return to [facility] neurosurgery. Spent 45 minutes discussing case and forming plan of care for patient.

## 2020-01-07 NOTE — Assessment & Plan Note (Addendum)
He has trouble dating back to childhood. He had IEPs set up for him throughout his school years.. 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 This makes it exceedingly difficult for him to return to school for retraining.

## 2020-01-10 NOTE — Progress Notes (Addendum)
Virtual Visit via Video Note  I connected with Ronald Griffin on 01/07/20 at  8:20 AM EDT by a video enabled telemedicine application and verified that I am speaking with the correct person using two identifiers.  Location: Patient: home, patient and provider were in visit Provider: home   I discussed the limitations of evaluation and management by telemedicine and the availability of in person appointments. The patient expressed understanding and agreed to proceed. Kem Boroughs, CMA was able to get the patient set up on a video visit    Subjective:    Patient ID: [name], male    DOB: [birth year], 54 y.o.   MRN: [id number]  Chief Complaint  Patient presents with  . 1 month followup    HPI Patient is in today for follow up on chronic medical concerns. No recent febrile illness or hospitalizations. He struggles with daily pain in his back and down his legs. He notes significant flares in paresthesias with burning of skin, when ever he tries to wear pants he has burning down both legs. If he does any physical labor his pain quickly escalated. On a good day without any flaring or over exertion pain is at a 6.5 of 10 and with exertion or on a bad day pain approaches 8 of 10. The burning is present separately and stays constant at 9 of 10 and with any pressure or touch such as putting on pants will escalate to 10/10. He has been unable to work since August 02, 2017 due to his injury and pain. Since his symptoms have escalated some but Lyrica helps some. No recent fall or injury. He is unable to work due to his discomfort and symptoms. Denies CP/palp/SOB/HA/congestion/fevers/GI or GU c/o. Taking meds as prescribed  Past Medical History:  Diagnosis Date  . Dizziness   . Elevated antinuclear antibody (ANA) level 11/21/2014  . Erectile dysfunction 11/21/2014  . Frequent headaches   . Hand pain, right 02/16/2017  . Headache 03/29/2014  . History of colonic polyps 11/21/2014  .  Hyperlipidemia   . Knee pain, right 11/21/2014  . Low back pain 04/03/2015  . Motion sickness 11/21/2014  . Myelopathy ([place])    thoracic  . Obesity 02/16/2017  . Post-operative nausea and vomiting   . Preventative health care 11/21/2014    Past Surgical History:  Procedure Laterality Date  . COLONOSCOPY  2012   in [place]    . THORACIC DISCECTOMY Left 02/09/2018   Procedure: Microdiscectomy - Left - Thoracic Seven-Thoracid Eight - Thoracic Eight - Thoracic Nine;  Surgeon: Earnie Larsson, MD;  Location: [facility];  Service: Neurosurgery;  Laterality: Left;  Microdiscectomy - Left - Thoracic Seven-Thoracid Eight - Thoracic Eight - Thoracic Nine  . VARICOCELE EXCISION    . VASECTOMY      Family History  Problem Relation Age of Onset  . Lung cancer Mother   . Breast cancer Mother   . Cancer Mother        breast age 48s, colon late 79s, lung, liver, skin  . Colon cancer Mother 57  . Bipolar disorder Father   . Cancer Father 85       colon cancer  . Dementia Father   . Colon cancer Father 42  . Cancer Maternal Grandmother        ?  Marland Kitchen Heart disease Maternal Grandfather        MI, sudden  . Stroke Paternal Grandmother 66  . Bipolar disorder Paternal  Grandfather   . Dementia Paternal Grandfather   . Cancer Paternal Grandfather        colon  . Colon cancer Paternal Grandfather   . Obesity Brother   . Colon cancer Cousin 7  . Esophageal cancer Neg Hx   . Stomach cancer Neg Hx     Social History   Socioeconomic History  . Marital status: Married    Spouse name: Ronald Griffin  . Number of children: 2  . Years of education: college  . Highest education level: Not on file  Occupational History  . Not on file  Tobacco Use  . Smoking status: Never Smoker  . Smokeless tobacco: Never Used  Vaping Use  . Vaping Use: Never used  Substance and Sexual Activity  . Alcohol use: Yes    Alcohol/week: 3.0 - 5.0 standard drinks    Types: 3 - 5 Standard drinks or equivalent per week      Comment: weekly 3-5 times per pt  . Drug use: No  . Sexual activity: Yes    Comment: lives with wife, works in [employer], part time at [employer], no dietary restrictions, exercises regularly  Other Topics Concern  . Not on file  Social History Narrative   Patient works full time for [employer]. Center. Patient lives at home with his wife Ronald Griffin).   Education college.   Right handed.   Caffeine 4-6 cups daily.    Social Determinants of Health   Financial Resource Strain:   . Difficulty of Paying Living Expenses: Not on file  Food Insecurity:   . Worried About Charity fundraiser in the Last Year: Not on file  . Ran Out of Food in the Last Year: Not on file  Transportation Needs:   . Lack of Transportation (Medical): Not on file  . Lack of Transportation (Non-Medical): Not on file  Physical Activity:   . Days of Exercise per Week: Not on file  . Minutes of Exercise per Session: Not on file  Stress:   . Feeling of Stress : Not on file  Social Connections:   . Frequency of Communication with Friends and Family: Not on file  . Frequency of Social Gatherings with Friends and Family: Not on file  . Attends Religious Services: Not on file  . Active Member of Clubs or Organizations: Not on file  . Attends Archivist Meetings: Not on file  . Marital Status: Not on file  Intimate Partner Violence:   . Fear of Current or Ex-Partner: Not on file  . Emotionally Abused: Not on file  . Physically Abused: Not on file  . Sexually Abused: Not on file    Outpatient Medications Prior to Visit  Medication Sig Dispense Refill  . amphetamine-dextroamphetamine (ADDERALL XR) 20 MG 24 hr capsule Take 1 capsule (20 mg total) by mouth every morning. 30 capsule 0  . DULoxetine (CYMBALTA) 30 MG capsule 30 mg po daily x 7 days then increase to 60 mg po daily 60 capsule 2  . pregabalin (LYRICA) 75 MG capsule Take 1-2 capsules (75-150 mg total) by mouth 2 (two) times daily  as needed (nerve pain). 120 capsule 3  . sildenafil (VIAGRA) 25 MG tablet Take 1 tablet (25 mg total) by mouth daily as needed for erectile dysfunction. 10 tablet 0  . azelaic acid (AZELEX) 20 % cream Apply topically 2 (two) times daily. After skin is thoroughly washed and patted dry, gently but thoroughly massage a thin film of azelaic  acid cream into the affected area twice daily, in the morning and evening. 30 g 0   No facility-administered medications prior to visit.    No Known Allergies  Review of Systems  Constitutional: Positive for malaise/fatigue. Negative for fever.  HENT: Negative for congestion.   Eyes: Negative for blurred vision.  Respiratory: Negative for shortness of breath.   Cardiovascular: Negative for chest pain, palpitations and leg swelling.  Gastrointestinal: Negative for abdominal pain, blood in stool and nausea.  Genitourinary: Negative for dysuria and frequency.  Musculoskeletal: Positive for back pain, joint pain and myalgias. Negative for falls and neck pain.  Skin: Negative for rash.  Neurological: Positive for tingling and sensory change. Negative for dizziness, loss of consciousness and headaches.  Endo/Heme/Allergies: Negative for environmental allergies.  Psychiatric/Behavioral: Negative for depression. The patient is not nervous/anxious.        Objective:    Physical Exam Constitutional:      Appearance: Normal appearance. He is not ill-appearing.  HENT:     Head: Normocephalic and atraumatic.     Right Ear: External ear normal.     Left Ear: External ear normal.     Nose: Nose normal.  Eyes:     General:        Right eye: No discharge.        Left eye: No discharge.  Pulmonary:     Effort: Pulmonary effort is normal.  Neurological:     Mental Status: He is alert and oriented to person, place, and time.  Psychiatric:        Behavior: Behavior normal.     Ht 5\' 11"  (1.803 m)   Wt 216 lb (98 kg)   BMI 30.13 kg/m  Wt Readings from  Last 3 Encounters:  01/07/20 216 lb (98 kg)  11/19/19 (!) 222 lb 9.6 oz (101 kg)  11/05/19 223 lb 6.4 oz (101.3 kg)    Diabetic Foot Exam - Simple   No data filed     Lab Results  Component Value Date   WBC 5.4 11/09/2019   HGB 14.6 11/09/2019   HCT 42.9 11/09/2019   PLT 199.0 11/09/2019   GLUCOSE 93 11/09/2019   CHOL 206 (H) 11/09/2019   TRIG 104.0 11/09/2019   HDL 54.20 11/09/2019   LDLDIRECT 140.0 02/14/2017   LDLCALC 131 (H) 11/09/2019   ALT 19 11/09/2019   AST 16 11/09/2019   NA 139 11/09/2019   K 4.4 11/09/2019   CL 106 11/09/2019   CREATININE 0.86 11/09/2019   BUN 16 11/09/2019   CO2 28 11/09/2019   TSH 4.41 11/09/2019   PSA 0.61 11/09/2019    Lab Results  Component Value Date   TSH 4.41 11/09/2019   Lab Results  Component Value Date   WBC 5.4 11/09/2019   HGB 14.6 11/09/2019   HCT 42.9 11/09/2019   MCV 91.8 11/09/2019   PLT 199.0 11/09/2019   Lab Results  Component Value Date   NA 139 11/09/2019   K 4.4 11/09/2019   CO2 28 11/09/2019   GLUCOSE 93 11/09/2019   BUN 16 11/09/2019   CREATININE 0.86 11/09/2019   BILITOT 1.2 11/09/2019   ALKPHOS 64 11/09/2019   AST 16 11/09/2019   ALT 19 11/09/2019   PROT 6.9 11/09/2019   ALBUMIN 4.6 11/09/2019   CALCIUM 9.1 11/09/2019   ANIONGAP 7 02/09/2018   GFR 92.06 11/09/2019   Lab Results  Component Value Date   CHOL 206 (H) 11/09/2019   Lab Results  Component Value Date   HDL 54.20 11/09/2019   Lab Results  Component Value Date   LDLCALC 131 (H) 11/09/2019   Lab Results  Component Value Date   TRIG 104.0 11/09/2019   Lab Results  Component Value Date   CHOLHDL 4 11/09/2019   No results found for: HGBA1C     Assessment & Plan:   Problem List Items Addressed This Visit    Hyperlipidemia    Encouraged heart healthy diet, increase exercise, avoid trans fats, consider a krill oil cap daily      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 He notes significant flares in paresthesias with burning of skin, when ever he tries to wear pants he has burning down both legs. If he does any physical labor his pain quickly escalated. On a good day without any flaring or over exertion pain is at a 6.5 of 10 and with exertion or on a bad day pain approaches 8 of 10. The burning is present separately and stays constant at 9 of 10 and with any pressure or touch such as putting on pants will escalate to 10/10. He has been unable to work since August 02, 2017 due to his injury and pain. Since his symptoms have escalated some but Lyrica helps some. He is following with Sports medicine and agrees to return to [facility] neurosurgery. Spent 45 minutes discussing case and forming plan of care for patient.       Attention and concentration deficit    Uses Adderall prn with good results no concerning side effects.      Basic learning disability    He has trouble dating back to childhood. He had IEPs set up for him throughout his school years.. 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 This makes it exceedingly difficult for him to return to school for retraining.          I am having Jeris Roser. July maintain his sildenafil, DULoxetine, amphetamine-dextroamphetamine, pregabalin, and Azelex.  Meds ordered this encounter  Medications  . azelaic acid (AZELEX) 20 % cream    Sig: Apply topically 2 (two) times daily. After skin is thoroughly washed and patted dry, gently but thoroughly massage a thin film of azelaic acid cream into the affected area twice daily, in the morning and evening.    Dispense:  30 g    Refill:  0    I discussed the assessment and treatment plan with the patient. The patient was provided an opportunity to ask questions and all were answered. The patient agreed with the plan and demonstrated an understanding of the instructions.   The patient was advised to call back or seek an  in-person evaluation if the symptoms worsen or if the condition fails to improve as anticipated.  I provided 45 minutes of face-to-face time during this encounter.   Penni Homans, MD

## 2020-01-10 NOTE — Assessment & Plan Note (Signed)
Uses Adderall prn with good results no concerning side effects.

## 2020-01-10 NOTE — Assessment & Plan Note (Signed)
Encouraged heart healthy diet, increase exercise, avoid trans fats, consider a krill oil cap daily 

## 2020-01-12 ENCOUNTER — Telehealth: Payer: Self-pay | Admitting: Family Medicine

## 2020-01-12 NOTE — Telephone Encounter (Signed)
Okay to change to telephone visit.  Patient should be aware that my ability to do physical exam is obviously limited with phone visit however if patient would prefer that I am okay with that.

## 2020-01-12 NOTE — Telephone Encounter (Signed)
Pt has 6 week f/u appt with you on Monday at 8am. Pt would like to do this via phone if this is OK with you.

## 2020-01-13 NOTE — Telephone Encounter (Signed)
Patient states that he will come in for the visit so that he can have the full physical exam to see what is going on with his back.

## 2020-01-17 ENCOUNTER — Encounter: Payer: Self-pay | Admitting: Family Medicine

## 2020-01-17 ENCOUNTER — Ambulatory Visit (INDEPENDENT_AMBULATORY_CARE_PROVIDER_SITE_OTHER): Payer: 59 | Admitting: Family Medicine

## 2020-01-17 ENCOUNTER — Other Ambulatory Visit: Payer: Self-pay

## 2020-01-17 VITALS — BP 102/74 | HR 57 | Ht 71.0 in | Wt 221.2 lb

## 2020-01-17 DIAGNOSIS — M5442 Lumbago with sciatica, left side: Secondary | ICD-10-CM | POA: Diagnosis not present

## 2020-01-17 DIAGNOSIS — M5104 Intervertebral disc disorders with myelopathy, thoracic region: Secondary | ICD-10-CM | POA: Diagnosis not present

## 2020-01-17 DIAGNOSIS — M5441 Lumbago with sciatica, right side: Secondary | ICD-10-CM

## 2020-01-17 NOTE — Patient Instructions (Signed)
Thank you for coming in today.  Plan for MRI of Tspine and Lspine.  Recheck after MRI.  Let me know sooner if you have trouble scheduling.

## 2020-01-17 NOTE — Progress Notes (Signed)
I, [name], LAT, ATC, am serving as scribe for Dr. Lynne Leader.  Ronald Griffin is a 54 y.o. male who presents to [facility] today for f/u of mid-back, LBP and B LE numbness/tingling and weakness.  He was last seen by Dr. Georgina Snell on 11/19/19 and was prescribed Lyrica and referred to PT.  Pt has a hx of a T7-8 and T8-9 laminotomy performed by Dr Trenton Gammon on 02/09/18.  Since his last visit, pt reports that he's feeling about the same as previously.  He states that yesterday he did some yard work and [employer] his fence and notes that he's feeling very stiff.  He is taking the prescribed Lyrica and noted some improvement in his symptoms.  He went to PT but had to stop due to his high co-pay.  Diagnostic testing: L-spine XR- 02/09/18 and 08/04/17; T-spine MRI- 10/01/17; L-spine MRI- 08/20/17   Pertinent review of systems: No fevers or chills  Relevant historical information: Thoracic laminectomy   Exam:  BP 102/74 (BP Location: Right Arm, Patient Position: Sitting, Cuff Size: Large)   Pulse (!) 57   Ht 5\' 11"  (1.803 m)   Wt 221 lb 3.2 oz (100.3 kg)   SpO2 98%   BMI 30.85 kg/m  General: Well Developed, well nourished, and in no acute distress.   MSK: T-spine normal-appearing nontender midline.  Decreased thoracic motion. L-spine normal-appearing nontender decreased lumbar motion.  Positive slump test bilaterally.  Lower extremity strength is intact.    Assessment and Plan: 54 y.o. male with persistent thoracic and lumbar pain with radicular symptoms.  At this point failing typical conservative management.  Plan for MRI T-spine and L-spine for potential injection planning.  Recheck after MRIs.   PDMP not reviewed this encounter. Orders Placed This Encounter  Procedures  . MR THORACIC SPINE WO CONTRAST    Standing Status:   Future    Standing Expiration Date:   01/16/2021    Order Specific Question:   What is the patient's sedation requirement?     Answer:   No Sedation    Order Specific Question:   Does the patient have a pacemaker or implanted devices?    Answer:   No    Order Specific Question:   Preferred imaging location?    Answer:   Product/process development scientist (table limit-350lbs)    Order Specific Question:   Radiology Contrast Protocol - do NOT remove file path    Answer:   [email or web address]  . MR Lumbar Spine Wo Contrast    Standing Status:   Future    Standing Expiration Date:   01/16/2021    Order Specific Question:   What is the patient's sedation requirement?    Answer:   No Sedation    Order Specific Question:   Does the patient have a pacemaker or implanted devices?    Answer:   No    Order Specific Question:   Preferred imaging location?    Answer:   Product/process development scientist (table limit-350lbs)    Order Specific Question:   Radiology Contrast Protocol - do NOT remove file path    Answer:   [email or web address]   No orders of the defined types were placed in this encounter.    Discussed warning signs or symptoms. Please see discharge instructions. Patient expresses understanding.   The above documentation has been reviewed and is accurate and complete Lynne Leader, M.D.  Total encounter time 20 minutes including face-to-face time with the patient and,  reviewing past medical record, and charting on the date of service.

## 2020-01-24 ENCOUNTER — Telehealth: Payer: Self-pay | Admitting: Family Medicine

## 2020-01-24 NOTE — Telephone Encounter (Signed)
Caller: [place] (Dr. Phillips Odor) Call back # [phone]  Dr.Nourian would like to schedule a peer to peer to review disability form.

## 2020-01-25 NOTE — Telephone Encounter (Signed)
Dr. Phillips Odor office calling back to check the status of meeting schedule. Disability form will be due tomorrow.

## 2020-01-27 ENCOUNTER — Other Ambulatory Visit: Payer: Self-pay | Admitting: Family Medicine

## 2020-01-27 MED ORDER — AZELEX 20 % EX CREA: TOPICAL_CREAM | Freq: Two times a day (BID) | CUTANEOUS | 0 refills | Status: DC

## 2020-01-28 NOTE — Telephone Encounter (Signed)
Please let them know I will need to do the peer to peer from the office as I have some notes there. So next Tuesday at lunch is probably the best time or could do Wednesday once I get the notes.

## 2020-01-31 NOTE — Telephone Encounter (Signed)
LMOM for call back. 

## 2020-02-01 ENCOUNTER — Other Ambulatory Visit: Payer: Self-pay | Admitting: Family Medicine

## 2020-02-01 NOTE — Telephone Encounter (Signed)
[phone] Patient is calling Ronald Griffin back

## 2020-02-01 NOTE — Telephone Encounter (Signed)
Several attempts has been made to reach out for peer to peer review to discuss Disability Paperwork, which was unsuccessful, and no return calls.  Have reached out to patient and patient will call  for follow-up with case manager.

## 2020-02-01 NOTE — Telephone Encounter (Signed)
Patient called back regarding Disability Paperwork and will be reinstated.  Outside provider has submitted, notes.  [place] [employer] has forwarded paperwork, for Dr. Charlett Blake to fill out and fax back to [employer].

## 2020-02-02 ENCOUNTER — Other Ambulatory Visit: Payer: Self-pay | Admitting: Family Medicine

## 2020-02-02 MED ORDER — AMPHETAMINE-DEXTROAMPHET ER 20 MG PO CP24: 20.0000 mg | ORAL_CAPSULE | ORAL | 0 refills | Status: DC

## 2020-02-02 NOTE — Telephone Encounter (Signed)
Last written: 11/05/19 Last ov: 01/07/20 Next ov:04/03/20 Contract: none UDS: none  WILL GET UDS AND CONTRACT AT NEXT VISIT

## 2020-02-09 NOTE — Telephone Encounter (Signed)
Patient is requesting a call back in reference to disability form , patient states that he is running to problems with form

## 2020-02-10 NOTE — Telephone Encounter (Signed)
LMOM to return call.

## 2020-02-10 NOTE — Telephone Encounter (Signed)
Patient was notified and will send additional disability paperwork to be filled out and updated and a copy of MRI will be forward to Dr. Charlett Blake as well.

## 2020-02-11 NOTE — Telephone Encounter (Signed)
He got some information to discuss with assistant

## 2020-02-12 NOTE — Telephone Encounter (Signed)
Called spoke with pt informed paperwork is still being worked on by PCP. Will have better update on Monday once provider is in the building.

## 2020-02-14 ENCOUNTER — Ambulatory Visit (INDEPENDENT_AMBULATORY_CARE_PROVIDER_SITE_OTHER): Payer: 59

## 2020-02-14 ENCOUNTER — Other Ambulatory Visit: Payer: Self-pay

## 2020-02-14 DIAGNOSIS — M5104 Intervertebral disc disorders with myelopathy, thoracic region: Secondary | ICD-10-CM

## 2020-02-14 DIAGNOSIS — M5441 Lumbago with sciatica, right side: Secondary | ICD-10-CM | POA: Diagnosis not present

## 2020-02-14 DIAGNOSIS — M5442 Lumbago with sciatica, left side: Secondary | ICD-10-CM

## 2020-02-14 NOTE — Progress Notes (Signed)
MRI T-spine shows changes but in general looks improved compared to where it was previously.  Some arthritis and bulging disks are present.  Recommend schedule follow-up appoint with me in person to review the thoracic and lumbar spine MRI results.

## 2020-02-14 NOTE — Progress Notes (Signed)
MRI lumbar spine shows some bulging disks possibly pinching the left L3 nerve root.  Some arthritis is present in the spine as well.  Schedule follow-up appoint with me in person to review the lumbar and thoracic spine MRI results.

## 2020-02-17 ENCOUNTER — Ambulatory Visit (INDEPENDENT_AMBULATORY_CARE_PROVIDER_SITE_OTHER): Payer: 59 | Admitting: Family Medicine

## 2020-02-17 ENCOUNTER — Other Ambulatory Visit: Payer: Self-pay

## 2020-02-17 ENCOUNTER — Encounter: Payer: Self-pay | Admitting: Family Medicine

## 2020-02-17 VITALS — BP 140/78 | HR 65 | Ht 71.0 in | Wt 222.0 lb

## 2020-02-17 DIAGNOSIS — M546 Pain in thoracic spine: Secondary | ICD-10-CM | POA: Diagnosis not present

## 2020-02-17 DIAGNOSIS — M5104 Intervertebral disc disorders with myelopathy, thoracic region: Secondary | ICD-10-CM | POA: Diagnosis not present

## 2020-02-17 DIAGNOSIS — R201 Hypoesthesia of skin: Secondary | ICD-10-CM

## 2020-02-17 DIAGNOSIS — M5441 Lumbago with sciatica, right side: Secondary | ICD-10-CM

## 2020-02-17 DIAGNOSIS — M5442 Lumbago with sciatica, left side: Secondary | ICD-10-CM | POA: Diagnosis not present

## 2020-02-17 DIAGNOSIS — G8929 Other chronic pain: Secondary | ICD-10-CM

## 2020-02-17 NOTE — Progress Notes (Signed)
I, Ronald Griffin, LAT, ATC, am serving as scribe for Dr. Lynne Griffin.  Ronald Griffin is a 54 y.o. male who presents to [place] at [facility] today for f/u of T-spine and L-spine MRI.  He was last seen by Dr. Georgina Griffin on 01/17/20 w/ con't c/o of mid-back, LBP and B LE numbness/tingling and weakness and was referred for f/u MRI of his T-spine and L-spine.  Pt has a hx of a T7-8 and T8-9 laminotomy performed by Dr Ronald Griffin on 02/09/18.  Since his last visit, pt reports that his back pain remains about the same / progressively worsening.  He states that he cannot wear regular pants due to the nerve pain in his lower legs.  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   MSK: Lower extremity strength is intact.  Normal gait.  Hyperesthesia left medial calf.     Lab and Radiology Results No results found for this or any previous visit (from the past 72 hour(s)). MR THORACIC SPINE WO CONTRAST  Result Date: 02/14/2020 CLINICAL DATA:  Cord compression. Herniated intervertebral disc with myelopathy. Bilateral back pain and bilateral sciatica. EXAM: MRI THORACIC SPINE WITHOUT CONTRAST TECHNIQUE: Multiplanar, multisequence MR imaging of the thoracic spine was performed. No intravenous contrast was administered. COMPARISON:  Radiography 02/09/2018.  MRI 10/01/2017. FINDINGS: Alignment:  Normal Vertebrae: No fracture or primary bone lesion. Cord: No cord compression or primary cord lesion. No  convincing cord abnormality at T8-9 as was suggested previously. Paraspinal and other soft tissues: Negative Disc levels: T1-2: Normal T2-3: Minimal disc bulge.  No stenosis. T3-4: Bilateral bulging of the disc without compressive narrowing of the canal or foramina. T4-5: Shallow left paracentral disc herniation. Bilateral facet osteoarthritis. Mild deformity of the ventral cord on the left, but with ample subarachnoid space present dorsal to the cord. T5-6: Mild bulging of the disc towards the left. No compressive stenosis. T6-7: Shallow disc protrusion narrows the ventral subarachnoid space but does not affect the cord or show foraminal extension. T7-8: Osteophytes and shallow disc protrusion narrows the ventral subarachnoid space but do not affect the cord or show foraminal extension. Disc material has involuted since 2019. T8-9: Mild bulging of the disc. No compressive stenosis. Disc material has involuted since 2019. T9-10: Minimal disc bulge.  No stenosis. T11-12: Disc bulge more prominent towards the left. No canal stenosis. Mild left foraminal narrowing, not likely compressive. T12-L1: Bulging of the disc. Narrowing of the ventral subarachnoid space but no apparent neural compression. IMPRESSION: 1. No cord compression. No convincing cord abnormality at the T8-9 level as was suggested previously. 2. Multilevel degenerative changes throughout the thoracic region as outlined above. This includes a shallow left paracentral disc herniation at T4-5 with mild deformity of the left ventral cord, but with ample subarachnoid space present dorsal to the cord. Facet osteoarthritis at this level could contribute to back pain. 3. Disc degeneration and shallow disc protrusions at T7-8 and T8-9 have involuted  since 2019. Presently these look like noncompressive disc bulges. No compressive stenosis at those levels. 4. Left-sided predominant disc bulge at T11-12 with mild left foraminal narrowing, not likely compressive.  Electronically Signed   By: Ronald Griffin M.D.   On: 02/14/2020 09:36   MR Lumbar Spine Wo Contrast  Result Date: 02/14/2020 CLINICAL DATA:  Low back pain since a fall on wound stairs in April, 2019. Subsequent encounter. EXAM: MRI LUMBAR SPINE WITHOUT CONTRAST TECHNIQUE: Multiplanar, multisequence MR imaging of the lumbar spine was performed. No intravenous contrast was administered. COMPARISON:  MRI lumbar spine 08/20/2017. FINDINGS: Segmentation:  Standard. Alignment:  Maintained. Vertebrae: No fracture, evidence of discitis, or bone lesion. Marrow signal is mildly heterogeneous. Scattered degenerative endplate signal change again seen. Stable compared to the prior exam. Conus medullaris and cauda equina: Conus extends to the L1 level. Conus and cauda equina appear normal. Paraspinal and other soft tissues: Parapelvic left renal cysts are unchanged. Disc levels: T11-12 is imaged in the sagittal plane only. There is a minimal disc bulge but the central canal and foramina appear open. T12-L1: Shallow disc bulge and mild central canal narrowing. Foramina open. No change. L1-2: No change in a shallow right paracentral protrusion without stenosis. L2-3: Shallow left paracentral and foraminal protrusion. Mild foraminal narrowing is more notable on the left. Very mild central canal stenosis. No change. L3-4: Minimal disc bulge and mild facet arthropathy without stenosis. No change. L4-5: Shallow disc bulge to the right without stenosis, unchanged. L5-S1: Minimal disc bulge.  No stenosis. IMPRESSION: No new abnormality since the prior examination. Mild multilevel degenerative disc disease results in very mild central canal stenosis and mild left foraminal narrowing at L2-3. Electronically Signed   By: Ronald Griffin M.D.   On: 02/14/2020 09:41   I, Ronald Griffin, personally (independently) visualized and performed the interpretation of the images attached in this note.     Assessment and Plan: 54 y.o. male  with chronic thoracic and lumbar back pain with paresthesia and hyperesthesia especially prominent left medial calf.  Although patient does have some potential for neuroforaminal stenosis and potential radiculopathy in the lumbar spine based on MRI I do not think that is the main problem.  Concerned he has persistent myelopathy from his previous disc herniation requiring decompression.  He may benefit from pain management referral.  He is not interested in opiates and I think would be a good candidate for potential injections in and around his spine including epidural steroid injections and facet injections.  He may be also a good candidate for nonopiate pain management strategies as well.  Referral placed today.  If not able to access pain management soon would proceed with left L3 epidural steroid injection   PDMP not reviewed this encounter. Orders Placed This Encounter  Procedures  . Ambulatory referral to Physical Medicine Rehab    Referral Priority:   Routine    Referral Type:   Rehabilitation    Referral Reason:   Specialty Services Required    Requested Specialty:   Physical Medicine and Rehabilitation    Number of Visits Requested:   1   No orders of the defined types were placed in this encounter.    Discussed warning signs or symptoms. Please see discharge instructions. Patient expresses understanding.   The above documentation has been reviewed and is accurate and complete Ronald Griffin, M.D. Total encounter time 20 minutes including face-to-face time with the patient and, reviewing past medical record, and charting on the date of  service.   MRI findings and plan

## 2020-02-17 NOTE — Patient Instructions (Addendum)
Thank you for coming in today.  Plan for referral to pain management.   If you cannot get in quickly let me know and I will order epidural steroid injection Left L3.   If lyrica did not help stop it.   Continue cymbalta.   Ok to try TENS unit.   Call helen to get a CD made of your images.  You may need that for the future. [phone]

## 2020-02-24 ENCOUNTER — Other Ambulatory Visit: Payer: Self-pay | Admitting: Medical

## 2020-02-24 MED ORDER — SILDENAFIL CITRATE 25 MG PO TABS: 25.0000 mg | ORAL_TABLET | Freq: Every day | ORAL | 0 refills | Status: DC | PRN

## 2020-02-24 NOTE — Telephone Encounter (Deleted)
.  contr

## 2020-02-24 NOTE — Telephone Encounter (Signed)
Requesting refill of medication

## 2020-02-28 NOTE — Telephone Encounter (Signed)
Patient is calling back in regards to paperwork. Patient would like a call back

## 2020-02-28 NOTE — Telephone Encounter (Signed)
Called pt back  no answer.paperwork was faxed and confirmed 02/21/20

## 2020-03-06 ENCOUNTER — Encounter: Payer: Self-pay | Admitting: Physical Medicine and Rehabilitation

## 2020-03-06 NOTE — Telephone Encounter (Signed)
error 

## 2020-03-10 ENCOUNTER — Telehealth: Payer: Self-pay

## 2020-03-10 NOTE — Telephone Encounter (Signed)
Pt called in states that he would like a copy of his disability paperwork. I left it upfront for him to pick up.

## 2020-04-03 ENCOUNTER — Other Ambulatory Visit: Payer: Self-pay

## 2020-04-03 ENCOUNTER — Ambulatory Visit (INDEPENDENT_AMBULATORY_CARE_PROVIDER_SITE_OTHER): Payer: 59 | Admitting: Family Medicine

## 2020-04-03 ENCOUNTER — Encounter: Payer: Self-pay | Admitting: Family Medicine

## 2020-04-03 VITALS — BP 132/80 | HR 59 | Temp 97.8°F | Resp 16 | Wt 222.6 lb

## 2020-04-03 DIAGNOSIS — Z0000 Encounter for general adult medical examination without abnormal findings: Secondary | ICD-10-CM | POA: Diagnosis not present

## 2020-04-03 DIAGNOSIS — Z1211 Encounter for screening for malignant neoplasm of colon: Secondary | ICD-10-CM

## 2020-04-03 DIAGNOSIS — R7989 Other specified abnormal findings of blood chemistry: Secondary | ICD-10-CM | POA: Diagnosis not present

## 2020-04-03 DIAGNOSIS — I8390 Asymptomatic varicose veins of unspecified lower extremity: Secondary | ICD-10-CM | POA: Insufficient documentation

## 2020-04-03 DIAGNOSIS — R739 Hyperglycemia, unspecified: Secondary | ICD-10-CM | POA: Diagnosis not present

## 2020-04-03 DIAGNOSIS — R768 Other specified abnormal immunological findings in serum: Secondary | ICD-10-CM

## 2020-04-03 DIAGNOSIS — I8392 Asymptomatic varicose veins of left lower extremity: Secondary | ICD-10-CM

## 2020-04-03 DIAGNOSIS — G8929 Other chronic pain: Secondary | ICD-10-CM

## 2020-04-03 DIAGNOSIS — E669 Obesity, unspecified: Secondary | ICD-10-CM

## 2020-04-03 DIAGNOSIS — R32 Unspecified urinary incontinence: Secondary | ICD-10-CM

## 2020-04-03 DIAGNOSIS — E785 Hyperlipidemia, unspecified: Secondary | ICD-10-CM | POA: Diagnosis not present

## 2020-04-03 DIAGNOSIS — M549 Dorsalgia, unspecified: Secondary | ICD-10-CM | POA: Diagnosis not present

## 2020-04-03 DIAGNOSIS — R41840 Attention and concentration deficit: Secondary | ICD-10-CM

## 2020-04-03 DIAGNOSIS — F418 Other specified anxiety disorders: Secondary | ICD-10-CM

## 2020-04-03 DIAGNOSIS — L719 Rosacea, unspecified: Secondary | ICD-10-CM

## 2020-04-03 LAB — COMPREHENSIVE METABOLIC PANEL
ALT: 17 U/L (ref 0–53)
AST: 18 U/L (ref 0–37)
Albumin: 4.6 g/dL (ref 3.5–5.2)
Alkaline Phosphatase: 64 U/L (ref 39–117)
BUN: 11 mg/dL (ref 6–23)
CO2: 29 mEq/L (ref 19–32)
Calcium: 9.3 mg/dL (ref 8.4–10.5)
Chloride: 102 mEq/L (ref 96–112)
Creatinine, Ser: 0.88 mg/dL (ref 0.40–1.50)
GFR: 96.33 mL/min (ref >60.00)
Glucose, Bld: 95 mg/dL (ref 70–99)
Potassium: 4.7 mEq/L (ref 3.5–5.1)
Sodium: 138 mEq/L (ref 135–145)
Total Bilirubin: 1 mg/dL (ref 0.2–1.2)
Total Protein: 7.2 g/dL (ref 6.0–8.3)

## 2020-04-03 LAB — LIPID PANEL
Cholesterol: 234 mg/dL — ABNORMAL HIGH (ref 0–200)
HDL: 60.8 mg/dL (ref >39.00)
LDL Cholesterol: 154 mg/dL — ABNORMAL HIGH (ref 0–99)
NonHDL: 173.12
Total CHOL/HDL Ratio: 4
Triglycerides: 94 mg/dL (ref 0.0–149.0)
VLDL: 18.8 mg/dL (ref 0.0–40.0)

## 2020-04-03 LAB — URINALYSIS
Bilirubin Urine: NEGATIVE
Hgb urine dipstick: NEGATIVE
Ketones, ur: NEGATIVE
Leukocytes,Ua: NEGATIVE
Nitrite: NEGATIVE
Specific Gravity, Urine: 1.015 (ref 1.000–1.030)
Total Protein, Urine: NEGATIVE
Urine Glucose: NEGATIVE
Urobilinogen, UA: 0.2 (ref 0.0–1.0)
pH: 6 (ref 5.0–8.0)

## 2020-04-03 LAB — CBC
HCT: 43.2 % (ref 39.0–52.0)
Hemoglobin: 14.7 g/dL (ref 13.0–17.0)
MCHC: 33.9 g/dL (ref 30.0–36.0)
MCV: 91.7 fl (ref 78.0–100.0)
Platelets: 202 10*3/uL (ref 150.0–400.0)
RBC: 4.72 Mil/uL (ref 4.22–5.81)
RDW: 13.9 % (ref 11.5–15.5)
WBC: 4.6 10*3/uL (ref 4.0–10.5)

## 2020-04-03 LAB — TSH: TSH: 5.35 u[IU]/mL — ABNORMAL HIGH (ref 0.35–4.50)

## 2020-04-03 LAB — HEMOGLOBIN A1C: Hgb A1c MFr Bld: 5.6 % (ref 4.6–6.5)

## 2020-04-03 MED ORDER — MINOCYCLINE HCL 50 MG PO TABS: 50.0000 mg | ORAL_TABLET | Freq: Two times a day (BID) | ORAL | 3 refills | Status: DC

## 2020-04-03 MED ORDER — TIZANIDINE HCL 4 MG PO TABS
2.0000 mg | ORAL_TABLET | Freq: Every evening | ORAL | 0 refills | Status: DC | PRN
Start: 2020-04-03 — End: 2021-06-06

## 2020-04-03 NOTE — Patient Instructions (Signed)
Preventive Care 41-56 Years Old, Male Preventive care refers to lifestyle choices and visits with your health care provider that can promote health and wellness. This includes:  A yearly physical exam. This is also called an annual well check.  Regular dental and eye exams.  Immunizations.  Screening for certain conditions.  Healthy lifestyle choices, such as eating a healthy diet, getting regular exercise, not using drugs or products that contain nicotine and tobacco, and limiting alcohol use. What can I expect for my preventive care visit? Physical exam Your health care provider will check:  Height and weight. These may be used to calculate body mass index (BMI), which is a measurement that tells if you are at a healthy weight.  Heart rate and blood pressure.  Your skin for abnormal spots. Counseling Your health care provider may ask you questions about:  Alcohol, tobacco, and drug use.  Emotional well-being.  Home and relationship well-being.  Sexual activity.  Eating habits.  Work and work Statistician. What immunizations do I need?  Influenza (flu) vaccine  This is recommended every year. Tetanus, diphtheria, and pertussis (Tdap) vaccine  You may need a Td booster every 10 years. Varicella (chickenpox) vaccine  You may need this vaccine if you have not already been vaccinated. Zoster (shingles) vaccine  You may need this after age 56. Measles, mumps, and rubella (MMR) vaccine  You may need at least one dose of MMR if you were born in 1957 or later. You may also need a second dose. Pneumococcal conjugate (PCV13) vaccine  You may need this if you have certain conditions and were not previously vaccinated. Pneumococcal polysaccharide (PPSV23) vaccine  You may need one or two doses if you smoke cigarettes or if you have certain conditions. Meningococcal conjugate (MenACWY) vaccine  You may need this if you have certain conditions. Hepatitis A  vaccine  You may need this if you have certain conditions or if you travel or work in places where you may be exposed to hepatitis A. Hepatitis B vaccine  You may need this if you have certain conditions or if you travel or work in places where you may be exposed to hepatitis B. Haemophilus influenzae type b (Hib) vaccine  You may need this if you have certain risk factors. Human papillomavirus (HPV) vaccine  If recommended by your health care provider, you may need three doses over 6 months. You may receive vaccines as individual doses or as more than one vaccine together in one shot (combination vaccines). Talk with your health care provider about the risks and benefits of combination vaccines. What tests do I need? Blood tests  Lipid and cholesterol levels. These may be checked every 5 years, or more frequently if you are over 60 years old.  Hepatitis C test.  Hepatitis B test. Screening  Lung cancer screening. You may have this screening every year starting at age 56 if you have a 30-pack-year history of smoking and currently smoke or have quit within the past 15 years.  Prostate cancer screening. Recommendations will vary depending on your family history and other risks.  Colorectal cancer screening. All adults should have this screening starting at age 56 and continuing until age 2. Your health care provider may recommend screening at age 56 if you are at increased risk. You will have tests every 1-10 years, depending on your results and the type of screening test.  Diabetes screening. This is done by checking your blood sugar (glucose) after you have not eaten  for a while (fasting). You may have this done every 1-3 years.  Sexually transmitted disease (STD) testing. Follow these instructions at home: Eating and drinking  Eat a diet that includes fresh fruits and vegetables, whole grains, lean protein, and low-fat dairy products.  Take vitamin and mineral supplements as  recommended by your health care provider.  Do not drink alcohol if your health care provider tells you not to drink.  If you drink alcohol: ? Limit how much you have to 0-2 drinks a day. ? Be aware of how much alcohol is in your drink. In the [place] one drink equals one 12 oz bottle of beer (355 mL), one 5 oz glass of wine (148 mL), or one 1 oz glass of hard liquor (44 mL). Lifestyle  Take daily care of your teeth and gums.  Stay active. Exercise for at least 30 minutes on 5 or more days each week.  Do not use any products that contain nicotine or tobacco, such as cigarettes, e-cigarettes, and chewing tobacco. If you need help quitting, ask your health care provider.  If you are sexually active, practice safe sex. Use a condom or other form of protection to prevent STIs (sexually transmitted infections).  Talk with your health care provider about taking a low-dose aspirin every day starting at age 56. What's next?  Go to your health care provider once a year for a well check visit.  Ask your health care provider how often you should have your eyes and teeth checked.  Stay up to date on all vaccines. This information is not intended to replace advice given to you by your health care provider. Make sure you discuss any questions you have with your health care provider. Document Revised: 04/09/2018 Document Reviewed: 04/09/2018 Elsevier Patient Education  2020 [employer].

## 2020-04-03 NOTE — Assessment & Plan Note (Signed)
Uses Adderall very infrequently with good results

## 2020-04-03 NOTE — Assessment & Plan Note (Addendum)
Flared with minimal working in the yard yesterday just for a short time. This confirms returning to any level of work especially physically strenuous work could not be tolerated. Notes it is his entire back and his legs are very numb. He could not put his blanket on his legs was too uncomfortable. He is following with sports medicine and he has now been referred to pain management and is seeing Dr Nelva Bush at [facility] for consideration of further interventions. He is struggling with his disability insurance really trying to make him go back to work and/or retrain. He got through school with an IEP but was able to get by. He will not do well tryng to go back to school. He is very limited in what he is able to do each day due to his pain and debility. Tizanidine 2-4 mg qhs prn is prescribed today to use sparingly.

## 2020-04-03 NOTE — Assessment & Plan Note (Signed)
Patient encouraged to maintain heart healthy diet, regular exercise, adequate sleep. Consider daily probiotics. Take medications as prescribed. Labs ordered and reviewed 

## 2020-04-03 NOTE — Progress Notes (Signed)
Subjective:    Patient ID: Ronald Griffin, male    DOB: [birth year], 54 y.o.   MRN: [id number]  Chief Complaint  Patient presents with  . Annual Exam    HPI Patient is in today for annual preventative exam and follow up on chronic medical concerns. Flared with minimal working in the yard yesterday just for a short time. This confirms returning to any level of work especially physically strenuous work could not be tolerated. Notes it is his entire back and his legs are very numb. He could not put his blanket on his legs was too uncomfortable. He is following with sports medicine and he has now been referred to pain management and is seeing Dr Nelva Bush at [facility] for consideration of further interventions. He is struggling with his disability insurance really trying to make him go back to work and/or retrain. He got through school with an IEP but was able to get by. He will not do well tryng to go back to school. He is very limited in what he is able to do each day due to his pain and debility. He notes his varicosity in his left lower leg is enlarging but not symptomatic. Denies CP/palp/SOB/HA/congestion/fevers/GI or GU c/o. Taking meds as prescribed. He is noting his wife is concerned about his labile moods and irritability. He notes he can have rapid mood changes but no hyperspending, lack of sleep etc. Some anhedonia but no suicidal ideation noted.   Past Medical History:  Diagnosis Date  . Dizziness   . Elevated antinuclear antibody (ANA) level 11/21/2014  . Erectile dysfunction 11/21/2014  . Frequent headaches   . Hand pain, right 02/16/2017  . Headache 03/29/2014  . History of colonic polyps 11/21/2014  . Hyperlipidemia   . Knee pain, right 11/21/2014  . Low back pain 04/03/2015  . Motion sickness 11/21/2014  . 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 Obesity 02/16/2017  . Post-operative nausea and vomiting   . Preventative health care 11/21/2014    Past Surgical History:  Procedure  Laterality Date  . COLONOSCOPY  2012   in [place]    . THORACIC DISCECTOMY Left 02/09/2018   Procedure: Microdiscectomy - Left - Thoracic Seven-Thoracid Eight - Thoracic Eight - Thoracic Nine;  Surgeon: Earnie Larsson, MD;  Location: [place];  Service: Neurosurgery;  Laterality: Left;  Microdiscectomy - Left - Thoracic Seven-Thoracid Eight - Thoracic Eight - Thoracic Nine  . VARICOCELE EXCISION    . VASECTOMY      Family History  Problem Relation Age of Onset  . Lung cancer Mother   . Breast cancer Mother   . Cancer Mother        breast age 28s, colon late 49s, lung, liver, skin  . Colon cancer Mother 43  . Bipolar disorder Father   . Cancer Father 71       colon cancer  . Dementia Father   . Colon cancer Father 34  . Thyroid disease Daughter   . Cancer Maternal Grandmother        ?  Marland Kitchen Heart disease Maternal Grandfather        MI, sudden  . Stroke Paternal Grandmother 81  . Bipolar disorder Paternal Grandfather   . Dementia Paternal Grandfather   . Cancer Paternal Grandfather        colon  . Colon cancer Paternal Grandfather   . Obesity Brother   . Colon cancer Cousin 27  . Lung cancer  Paternal Uncle   . Esophageal cancer Neg Hx   . Stomach cancer Neg Hx     Social History   Socioeconomic History  . Marital status: Married    Spouse name: Ronald Griffin  . Number of children: 2  . Years of education: college  . Highest education level: Not on file  Occupational History  . Not on file  Tobacco Use  . Smoking status: Never Smoker  . Smokeless tobacco: Never Used  Vaping Use  . Vaping Use: Never used  Substance and Sexual Activity  . Alcohol use: Yes    Alcohol/week: 3.0 - 5.0 standard drinks    Types: 3 - 5 Standard drinks or equivalent per week    Comment: weekly 3-5 times per pt  . Drug use: No  . Sexual activity: Yes    Comment: lives with wife, works in [employer], part time at [employer], no dietary restrictions, exercises regularly  Other Topics  Concern  . Not on file  Social History Narrative   Patient works full time for [employer]. [employer]. Patient lives at home with his wife Ronald Griffin).   Education college.   Right handed.   Caffeine 4-6 cups daily.    Social Determinants of Health   Financial Resource Strain:   . Difficulty of Paying Living Expenses: Not on file  Food Insecurity:   . Worried About Charity fundraiser in the Last Year: Not on file  . Ran Out of Food in the Last Year: Not on file  Transportation Needs:   . Lack of Transportation (Medical): Not on file  . Lack of Transportation (Non-Medical): Not on file  Physical Activity:   . Days of Exercise per Week: Not on file  . Minutes of Exercise per Session: Not on file  Stress:   . Feeling of Stress : Not on file  Social Connections:   . Frequency of Communication with Friends and Family: Not on file  . Frequency of Social Gatherings with Friends and Family: Not on file  . Attends Religious Services: Not on file  . Active Member of Clubs or Organizations: Not on file  . Attends Archivist Meetings: Not on file  . Marital Status: Not on file  Intimate Partner Violence:   . Fear of Current or Ex-Partner: Not on file  . Emotionally Abused: Not on file  . Physically Abused: Not on file  . Sexually Abused: Not on file    Outpatient Medications Prior to Visit  Medication Sig Dispense Refill  . amphetamine-dextroamphetamine (ADDERALL XR) 20 MG 24 hr capsule Take 1 capsule (20 mg total) by mouth every morning. 30 capsule 0  . azelaic acid (AZELEX) 20 % cream Apply topically 2 (two) times daily. After skin is thoroughly washed and patted dry, gently but thoroughly massage a thin film of azelaic acid cream into the affected area twice daily, in the morning and evening. 30 g 0  . sildenafil (VIAGRA) 25 MG tablet Take 1 tablet (25 mg total) by mouth daily as needed for erectile dysfunction. 10 tablet 0  . DULoxetine (CYMBALTA) 30 MG capsule 30 mg po  daily x 7 days then increase to 60 mg po daily 60 capsule 2  . pregabalin (LYRICA) 75 MG capsule Take 1-2 capsules (75-150 mg total) by mouth 2 (two) times daily as needed (nerve pain). 120 capsule 3   No facility-administered medications prior to visit.    No Known Allergies  Review of Systems  Constitutional: Positive  for malaise/fatigue. Negative for chills and fever.  HENT: Negative for congestion and hearing loss.   Eyes: Negative for discharge.  Respiratory: Negative for cough, sputum production and shortness of breath.   Cardiovascular: Negative for chest pain, palpitations and leg swelling.  Gastrointestinal: Negative for abdominal pain, blood in stool, constipation, diarrhea, heartburn, nausea and vomiting.  Genitourinary: Negative for dysuria, frequency, hematuria and urgency.  Musculoskeletal: Positive for back pain, myalgias and neck pain. Negative for falls.  Skin: Negative for rash.  Neurological: Negative for dizziness, sensory change, loss of consciousness, weakness and headaches.  Endo/Heme/Allergies: Negative for environmental allergies. Does not bruise/bleed easily.  Psychiatric/Behavioral: Positive for depression. Negative for suicidal ideas. The patient is nervous/anxious. The patient does not have insomnia.        Objective:    Physical Exam  BP 132/80 (BP Location: Left Arm, Patient Position: Sitting, Cuff Size: Normal)   Pulse (!) 59   Temp 97.8 F (36.6 C) (Temporal)   Resp 16   Wt 222 lb 9.6 oz (101 kg)   SpO2 97%   BMI 31.05 kg/m  Wt Readings from Last 3 Encounters:  04/03/20 222 lb 9.6 oz (101 kg)  02/17/20 222 lb (100.7 kg)  01/17/20 221 lb 3.2 oz (100.3 kg)    Diabetic Foot Exam - Simple   No data filed     Lab Results  Component Value Date   WBC 5.4 11/09/2019   HGB 14.6 11/09/2019   HCT 42.9 11/09/2019   PLT 199.0 11/09/2019   GLUCOSE 93 11/09/2019   CHOL 206 (H) 11/09/2019   TRIG 104.0 11/09/2019   HDL 54.20 11/09/2019    LDLDIRECT 140.0 02/14/2017   LDLCALC 131 (H) 11/09/2019   ALT 19 11/09/2019   AST 16 11/09/2019   NA 139 11/09/2019   K 4.4 11/09/2019   CL 106 11/09/2019   CREATININE 0.86 11/09/2019   BUN 16 11/09/2019   CO2 28 11/09/2019   TSH 4.41 11/09/2019   PSA 0.61 11/09/2019    Lab Results  Component Value Date   TSH 4.41 11/09/2019   Lab Results  Component Value Date   WBC 5.4 11/09/2019   HGB 14.6 11/09/2019   HCT 42.9 11/09/2019   MCV 91.8 11/09/2019   PLT 199.0 11/09/2019   Lab Results  Component Value Date   NA 139 11/09/2019   K 4.4 11/09/2019   CO2 28 11/09/2019   GLUCOSE 93 11/09/2019   BUN 16 11/09/2019   CREATININE 0.86 11/09/2019   BILITOT 1.2 11/09/2019   ALKPHOS 64 11/09/2019   AST 16 11/09/2019   ALT 19 11/09/2019   PROT 6.9 11/09/2019   ALBUMIN 4.6 11/09/2019   CALCIUM 9.1 11/09/2019   ANIONGAP 7 02/09/2018   GFR 92.06 11/09/2019   Lab Results  Component Value Date   CHOL 206 (H) 11/09/2019   Lab Results  Component Value Date   HDL 54.20 11/09/2019   Lab Results  Component Value Date   LDLCALC 131 (H) 11/09/2019   Lab Results  Component Value Date   TRIG 104.0 11/09/2019   Lab Results  Component Value Date   CHOLHDL 4 11/09/2019   No results found for: HGBA1C     Assessment & Plan:   Problem List Items Addressed This Visit    Preventative health care - Primary    Patient encouraged to maintain heart healthy diet, regular exercise, adequate sleep. Consider daily probiotics. Take medications as prescribed. Labs ordered and reviewed  Relevant Orders   CBC   Hyperlipidemia    Encouraged heart healthy diet, increase exercise, avoid trans fats, consider a krill oil cap daily      Relevant Orders   Comprehensive metabolic panel   Lipid panel   Elevated antinuclear antibody (ANA) level    Continue to monitor      Relevant Orders   Antinuclear Antib (ANA)   Back pain    Flared with minimal working in the yard yesterday just  for a short time. This confirms returning to any level of work especially physically strenuous work could not be tolerated. Notes it is his entire back and his legs are very numb. He could not put his blanket on his legs was too uncomfortable. He is following with sports medicine and he has now been referred to pain management and is seeing Dr Nelva Bush at [facility] for consideration of further interventions. He is struggling with his disability insurance really trying to make him go back to work and/or retrain. He got through school with an IEP but was able to get by. He will not do well tryng to go back to school. He is very limited in what he is able to do each day due to his pain and debility. Tizanidine 2-4 mg qhs prn is prescribed today to use sparingly.       Relevant Medications   tiZANidine (ZANAFLEX) 4 MG tablet   Colon cancer screening    He is due for next colonoscopy in May in 2022      Obesity    Encouraged DASH or MIND diet, decrease po intake and increase exercise as tolerated. Needs 7-8 hours of sleep nightly. Avoid trans fats, eat small, frequent meals every 4-5 hours with lean proteins, complex carbs and healthy fats. Minimize simple carbs      Attention and concentration deficit    Uses Adderall very infrequently with good results      Abnormal TSH    monitor      Relevant Orders   TSH   Depression with anxiety    Wife notes mood swings and irritability. Patient notes his father was a manic depressive with hypersexuality and hyper spending. Is interested in a referral for counseling to [facility]      Relevant Orders   Ambulatory referral to [facility]   Ambulatory referral to Psychiatry   Rosacea, acne    Will try Minocycline 50 mg and a probiotic daily. Report if no improvement. Prescription sent to pharmacy      Varicose vein of leg    Encouraged compression hose and if they enlarge or become symptomatic he will contact us so we can refer to vascular for  consideration      Urinary incontinence    Notes urgency and dribbling at times check UA and culture      Relevant Orders   Urinalysis   Urine Culture    Other Visit Diagnoses    Hyperglycemia       Relevant Orders   Hemoglobin A1c      I have discontinued Pablo Ledger. [place]'s DULoxetine and pregabalin. I am also having him start on minocycline and tiZANidine. Additionally, I am having him maintain his Azelex, amphetamine-dextroamphetamine, and sildenafil.  Meds ordered this encounter  Medications  . minocycline (DYNACIN) 50 MG tablet    Sig: Take 1 tablet (50 mg total) by mouth 2 (two) times daily.    Dispense:  30 tablet    Refill:  3  .  tiZANidine (ZANAFLEX) 4 MG tablet    Sig: Take 0.5-1 tablets (2-4 mg total) by mouth at bedtime as needed for muscle spasms.    Dispense:  30 tablet    Refill:  0     Penni Homans, MD

## 2020-04-03 NOTE — Assessment & Plan Note (Signed)
Encouraged heart healthy diet, increase exercise, avoid trans fats, consider a krill oil cap daily 

## 2020-04-03 NOTE — Assessment & Plan Note (Signed)
Will try Minocycline 50 mg and a probiotic daily. Report if no improvement. Prescription sent to pharmacy

## 2020-04-03 NOTE — Assessment & Plan Note (Signed)
Wife notes mood swings and irritability. Patient notes his father was a manic depressive with hypersexuality and hyper spending. Is interested in a referral for counseling to [facility]

## 2020-04-03 NOTE — Assessment & Plan Note (Signed)
monitor

## 2020-04-03 NOTE — Assessment & Plan Note (Signed)
Notes urgency and dribbling at times check UA and culture

## 2020-04-03 NOTE — Assessment & Plan Note (Signed)
Encouraged compression hose and if they enlarge or become symptomatic he will contact us so we can refer to vascular for consideration

## 2020-04-03 NOTE — Assessment & Plan Note (Signed)
Continue to monitor

## 2020-04-03 NOTE — Assessment & Plan Note (Signed)
He is due for next colonoscopy in May in 2022

## 2020-04-03 NOTE — Assessment & Plan Note (Signed)
Encouraged DASH or MIND diet, decrease po intake and increase exercise as tolerated. Needs 7-8 hours of sleep nightly. Avoid trans fats, eat small, frequent meals every 4-5 hours with lean proteins, complex carbs and healthy fats. Minimize simple carbs

## 2020-04-04 ENCOUNTER — Other Ambulatory Visit (INDEPENDENT_AMBULATORY_CARE_PROVIDER_SITE_OTHER): Payer: 59

## 2020-04-04 DIAGNOSIS — R7989 Other specified abnormal findings of blood chemistry: Secondary | ICD-10-CM | POA: Diagnosis not present

## 2020-04-04 LAB — URINE CULTURE
MICRO NUMBER:: 11280589
Result:: NO GROWTH
SPECIMEN QUALITY:: ADEQUATE

## 2020-04-04 LAB — ANA: Anti Nuclear Antibody (ANA): NEGATIVE

## 2020-04-04 LAB — T4, FREE: Free T4: 0.61 ng/dL (ref 0.60–1.60)

## 2020-04-04 NOTE — Progress Notes (Signed)
Pt received information regarding results/  would like to get letter unable to return to work based on your opinion  Please.

## 2020-04-04 NOTE — Addendum Note (Signed)
Addended by: Trenda Moots on: 20/12/1066 11:42 AM   Modules accepted: Orders

## 2020-04-14 ENCOUNTER — Other Ambulatory Visit: Payer: Self-pay

## 2020-04-14 ENCOUNTER — Encounter: Payer: Self-pay | Admitting: *Deleted

## 2020-04-14 ENCOUNTER — Telehealth (INDEPENDENT_AMBULATORY_CARE_PROVIDER_SITE_OTHER): Payer: 59 | Admitting: Family Medicine

## 2020-04-14 ENCOUNTER — Telehealth: Payer: Self-pay | Admitting: *Deleted

## 2020-04-14 DIAGNOSIS — E785 Hyperlipidemia, unspecified: Secondary | ICD-10-CM | POA: Diagnosis not present

## 2020-04-14 DIAGNOSIS — R7989 Other specified abnormal findings of blood chemistry: Secondary | ICD-10-CM | POA: Diagnosis not present

## 2020-04-14 DIAGNOSIS — E039 Hypothyroidism, unspecified: Secondary | ICD-10-CM

## 2020-04-14 DIAGNOSIS — M549 Dorsalgia, unspecified: Secondary | ICD-10-CM

## 2020-04-14 DIAGNOSIS — G8929 Other chronic pain: Secondary | ICD-10-CM

## 2020-04-14 MED ORDER — LEVOTHYROXINE SODIUM 25 MCG PO CAPS: 25.0000 ug | ORAL_CAPSULE | Freq: Every day | ORAL | 3 refills | Status: DC

## 2020-04-14 NOTE — Assessment & Plan Note (Signed)
Encouraged heart healthy diet, increase exercise, avoid trans fats, consider a krill oil cap daily 

## 2020-04-14 NOTE — Telephone Encounter (Signed)
Patient appt made letter sent to [facility] and copy put up front

## 2020-04-14 NOTE — Assessment & Plan Note (Signed)
With freeT4 at low end of normal. Will try low dose of Levothyroxine 25 mcg daily recheck labs in 3 months

## 2020-04-14 NOTE — Assessment & Plan Note (Signed)
He has daily pain and if he tries to do any work at all he has a flare in pain that debilitates him for several days.

## 2020-04-14 NOTE — Progress Notes (Signed)
Virtual Visit via Phone Note  I connected with Ronald Griffin on 04/14/20 at  8:40 AM EST by a phone enabled telemedicine application and verified that I am speaking with the correct person using two identifiers.  Location: Patient: home, patient and provider in visit Provider: home   I discussed the limitations of evaluation and management by telemedicine and the availability of in person appointments. The patient expressed understanding and agreed to proceed. Nena Alexander, CMA was able to get the patient set up on a phone visit after being unable to set up a video visit   Subjective:    Patient ID: Ronald Griffin, male    DOB: 02-Oct-1965, 54 y.o.   MRN: [id number]  No chief complaint on file.   HPI Patient is in today for follow up on chronic medical concerns. No recent febrile illness or hospitalizations. He continues to struggle with chronic back pain which flares with any activity. No recent fall or trauma. Denies CP/palp/SOB/HA/congestion/fevers/GI or GU c/o. Taking meds as prescribed  Past Medical History:  Diagnosis Date  . Dizziness   . Elevated antinuclear antibody (ANA) level 11/21/2014  . Erectile dysfunction 11/21/2014  . Frequent headaches   . Hand pain, right 02/16/2017  . Headache 03/29/2014  . History of colonic polyps 11/21/2014  . Hyperlipidemia   . Knee pain, right 11/21/2014  . Low back pain 04/03/2015  . Motion sickness 11/21/2014  . 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 Obesity 02/16/2017  . Post-operative nausea and vomiting   . Preventative health care 11/21/2014    Past Surgical History:  Procedure Laterality Date  . COLONOSCOPY  2012   in [place]    . THORACIC DISCECTOMY Left 02/09/2018   Procedure: Microdiscectomy - Left - Thoracic Seven-Thoracid Eight - Thoracic Eight - Thoracic Nine;  Surgeon: Earnie Larsson, MD;  Location: [place];  Service: Neurosurgery;  Laterality: Left;  Microdiscectomy - Left - Thoracic Seven-Thoracid Eight - Thoracic Eight -  Thoracic Nine  . VARICOCELE EXCISION    . VASECTOMY      Family History  Problem Relation Age of Onset  . Lung cancer Mother   . Breast cancer Mother   . Cancer Mother        breast age 60s, colon late 72s, lung, liver, skin  . Colon cancer Mother 79  . Bipolar disorder Father   . Cancer Father 66       colon cancer  . Dementia Father   . Colon cancer Father 7  . Thyroid disease Daughter   . Cancer Maternal Grandmother        ?  Marland Kitchen Heart disease Maternal Grandfather        MI, sudden  . Stroke Paternal Grandmother 93  . Bipolar disorder Paternal Grandfather   . Dementia Paternal Grandfather   . Cancer Paternal Grandfather        colon  . Colon cancer Paternal Grandfather   . Obesity Brother   . Colon cancer Cousin 43  . Lung cancer Paternal Uncle   . Esophageal cancer Neg Hx   . Stomach cancer Neg Hx     Social History   Socioeconomic History  . Marital status: Married    Spouse name: [name]  . Number of children: 2  . Years of education: college  . Highest education level: Not on file  Occupational History  . Not on file  Tobacco Use  . Smoking status: Never Smoker  . Smokeless  tobacco: Never Used  Vaping Use  . Vaping Use: Never used  Substance and Sexual Activity  . Alcohol use: Yes    Alcohol/week: 3.0 - 5.0 standard drinks    Types: 3 - 5 Standard drinks or equivalent per week    Comment: weekly 3-5 times per pt  . Drug use: No  . Sexual activity: Yes    Comment: lives with wife, works in [employer], part time at [employer], no dietary restrictions, exercises regularly  Other Topics Concern  . Not on file  Social History Narrative   Patient works full time for [employer]. [employer]. Patient lives at home with his wife [name]).   Education college.   Right handed.   Caffeine 4-6 cups daily.    Social Determinants of Health   Financial Resource Strain: Not on file  Food Insecurity: Not on file  Transportation Needs: Not on file   Physical Activity: Not on file  Stress: Not on file  Social Connections: Not on file  Intimate Partner Violence: Not on file    Outpatient Medications Prior to Visit  Medication Sig Dispense Refill  . amphetamine-dextroamphetamine (ADDERALL XR) 20 MG 24 hr capsule Take 1 capsule (20 mg total) by mouth every morning. 30 capsule 0  . azelaic acid (AZELEX) 20 % cream Apply topically 2 (two) times daily. After skin is thoroughly washed and patted dry, gently but thoroughly massage a thin film of azelaic acid cream into the affected area twice daily, in the morning and evening. 30 g 0  . minocycline (DYNACIN) 50 MG tablet Take 1 tablet (50 mg total) by mouth 2 (two) times daily. 30 tablet 3  . sildenafil (VIAGRA) 25 MG tablet Take 1 tablet (25 mg total) by mouth daily as needed for erectile dysfunction. 10 tablet 0  . tiZANidine (ZANAFLEX) 4 MG tablet Take 0.5-1 tablets (2-4 mg total) by mouth at bedtime as needed for muscle spasms. 30 tablet 0   No facility-administered medications prior to visit.    No Known Allergies  Review of Systems  Constitutional: Negative for fever and malaise/fatigue.  HENT: Negative for congestion.   Eyes: Negative for blurred vision.  Respiratory: Negative for shortness of breath.   Cardiovascular: Negative for chest pain, palpitations and leg swelling.  Gastrointestinal: Negative for abdominal pain, blood in stool and nausea.  Genitourinary: Negative for dysuria and frequency.  Musculoskeletal: Positive for back pain and myalgias. Negative for falls.  Skin: Negative for rash.  Neurological: Negative for dizziness, loss of consciousness and headaches.  Endo/Heme/Allergies: Negative for environmental allergies.  Psychiatric/Behavioral: Negative for depression. The patient is not nervous/anxious.        Objective:    Physical Exam unable to obtain via phone visit  There were no vitals taken for this visit. Wt Readings from Last 3 Encounters:  04/03/20  222 lb 9.6 oz (101 kg)  02/17/20 222 lb (100.7 kg)  01/17/20 221 lb 3.2 oz (100.3 kg)    Diabetic Foot Exam - Simple   No data filed    Lab Results  Component Value Date   WBC 4.6 04/03/2020   HGB 14.7 04/03/2020   HCT 43.2 04/03/2020   PLT 202.0 04/03/2020   GLUCOSE 95 04/03/2020   CHOL 234 (H) 04/03/2020   TRIG 94.0 04/03/2020   HDL 60.80 04/03/2020   LDLDIRECT 140.0 02/14/2017   LDLCALC 154 (H) 04/03/2020   ALT 17 04/03/2020   AST 18 04/03/2020   NA 138 04/03/2020   K 4.7  04/03/2020   CL 102 04/03/2020   CREATININE 0.88 04/03/2020   BUN 11 04/03/2020   CO2 29 04/03/2020   TSH 5.35 (H) 04/03/2020   PSA 0.61 11/09/2019   HGBA1C 5.6 04/03/2020    Lab Results  Component Value Date   TSH 5.35 (H) 04/03/2020   Lab Results  Component Value Date   WBC 4.6 04/03/2020   HGB 14.7 04/03/2020   HCT 43.2 04/03/2020   MCV 91.7 04/03/2020   PLT 202.0 04/03/2020   Lab Results  Component Value Date   NA 138 04/03/2020   K 4.7 04/03/2020   CO2 29 04/03/2020   GLUCOSE 95 04/03/2020   BUN 11 04/03/2020   CREATININE 0.88 04/03/2020   BILITOT 1.0 04/03/2020   ALKPHOS 64 04/03/2020   AST 18 04/03/2020   ALT 17 04/03/2020   PROT 7.2 04/03/2020   ALBUMIN 4.6 04/03/2020   CALCIUM 9.3 04/03/2020   ANIONGAP 7 02/09/2018   GFR 96.33 04/03/2020   Lab Results  Component Value Date   CHOL 234 (H) 04/03/2020   Lab Results  Component Value Date   HDL 60.80 04/03/2020   Lab Results  Component Value Date   LDLCALC 154 (H) 04/03/2020   Lab Results  Component Value Date   TRIG 94.0 04/03/2020   Lab Results  Component Value Date   CHOLHDL 4 04/03/2020   Lab Results  Component Value Date   HGBA1C 5.6 04/03/2020       Assessment & Plan:   Problem List Items Addressed This Visit    Hyperlipidemia    Encouraged heart healthy diet, increase exercise, avoid trans fats, consider a krill oil cap daily      Relevant Orders   Lipid panel   Comprehensive metabolic  panel   Back pain    He has daily pain and if he tries to do any work at all he has a flare in pain that debilitates him for several days.       Abnormal TSH    With freeT4 at low end of normal. Will try low dose of Levothyroxine 25 mcg daily recheck labs in 3 months       Other Visit Diagnoses    Hypothyroidism, unspecified type    -  Primary   Relevant Medications   Levothyroxine Sodium 25 MCG CAPS   Other Relevant Orders   TSH   T4, free      I am having Pablo Ledger. [place] start on Levothyroxine Sodium. I am also having him maintain his Azelex, amphetamine-dextroamphetamine, sildenafil, minocycline, and tiZANidine.  Meds ordered this encounter  Medications  . Levothyroxine Sodium 25 MCG CAPS    Sig: Take 1 capsule (25 mcg total) by mouth daily before breakfast.    Dispense:  30 capsule    Refill:  3     I discussed the assessment and treatment plan with the patient. The patient was provided an opportunity to ask questions and all were answered. The patient agreed with the plan and demonstrated an understanding of the instructions.   The patient was advised to call back or seek an in-person evaluation if the symptoms worsen or if the condition fails to improve as anticipated.  I provided 25 minutes of non-face-to-face time during this encounter.   Penni Homans, MD

## 2020-04-14 NOTE — Telephone Encounter (Signed)
Left message for patient to call back to make appt and to ask question about letter he needed.    For his letter I need to know if the one that was written in July worked.  If so can reprint.   he needs a lab appt 07/14/19 then a f/u after that. also he needs a letter saying after recent evaluation it is my determination that he is unable to return to work due to his physical debility. His back pain limits his ability to obtain gainful employment. Any physical activity flares his pain and causes days worth of incapacity. send to [facility] and he will come by and pick up a copy next week.

## 2020-04-16 ENCOUNTER — Other Ambulatory Visit: Payer: Self-pay | Admitting: Family Medicine

## 2020-04-17 ENCOUNTER — Other Ambulatory Visit: Payer: Self-pay | Admitting: *Deleted

## 2020-04-17 MED ORDER — AMPHETAMINE-DEXTROAMPHET ER 20 MG PO CP24: 20.0000 mg | ORAL_CAPSULE | ORAL | 0 refills | Status: DC

## 2020-04-17 MED ORDER — LEVOTHYROXINE SODIUM 25 MCG PO TABS: 25.0000 ug | ORAL_TABLET | Freq: Every day | ORAL | 3 refills | Status: DC

## 2020-04-17 NOTE — Telephone Encounter (Signed)
Requesting: Adderall XR 20mg  Contract: 04/03/2020 UDS: None Last Visit: 04/14/2020 Next Visit:  07/17/2020 Last Refill: 02/02/2020 #30 and 0RF  Please Advise

## 2020-04-18 ENCOUNTER — Encounter: Payer: Self-pay | Admitting: Physical Medicine and Rehabilitation

## 2020-04-18 ENCOUNTER — Other Ambulatory Visit: Payer: Self-pay

## 2020-04-18 ENCOUNTER — Encounter: Payer: 59 | Attending: Physical Medicine and Rehabilitation | Admitting: Physical Medicine and Rehabilitation

## 2020-04-18 VITALS — BP 131/87 | HR 55 | Temp 98.6°F | Ht 70.5 in | Wt 222.0 lb

## 2020-04-18 DIAGNOSIS — M48062 Spinal stenosis, lumbar region with neurogenic claudication: Secondary | ICD-10-CM | POA: Insufficient documentation

## 2020-04-18 DIAGNOSIS — G4701 Insomnia due to medical condition: Secondary | ICD-10-CM | POA: Insufficient documentation

## 2020-04-18 MED ORDER — AMITRIPTYLINE HCL 10 MG PO TABS: 10.0000 mg | ORAL_TABLET | Freq: Every day | ORAL | 1 refills | Status: DC

## 2020-04-18 NOTE — Patient Instructions (Signed)
1) Ginger 2) Blueberries 3) Salmon 4) Pumpkin seeds 5) dark chocolate 6) turmeric 7) tart cherries 8) virgin olive oil 9) chilli peppers 10) mint

## 2020-04-18 NOTE — Progress Notes (Signed)
Subjective:    Patient ID: Ronald Griffin, male    DOB: 02-02-1966, 54 y.o.   MRN: [id number]  HPI  Ronald Griffin is a 54 year old man who presents to establish care for lower back pain and sciatica.  He had a fall in 2019 and underwent surgery by Dr. Trenton Gammon. He has since had back pain and neuropathy.   MRI lumbar spine reviewed and shows the following: No new abnormality since the prior examination. Mild multilevel degenerative disc disease results in very mild central canal stenosis and mild left foraminal narrowing at L2-3.  He has tried Gabapentin. He went up to 1100mg  and did not feel any benefit or side effects.   Average pain is 7/10. His pain is constant, sharp, burning, tingling, and aching.   Pain Inventory Average Pain 7 Pain Right Now 7 My pain is constant, sharp, burning, tingling and aching  In the last 24 hours, has pain interfered with the following? General activity 9 Relation with others 9 Enjoyment of life 10 What TIME of day is your pain at its worst? morning , daytime, evening and night Sleep (in general) Fair  Pain is worse with: walking, bending, sitting, inactivity, standing and some activites Pain improves with: nothing Relief from Meds: na  how many minutes can you walk? 10 ability to climb steps?  yes do you drive?  yes  disabled: date disabled 08-02-17 I need assistance with the following:  household duties  weakness numbness tingling trouble walking spasms depression  New pt  New pt    Family History  Problem Relation Age of Onset  . Lung cancer Mother   . Breast cancer Mother   . Cancer Mother        breast age 6s, colon late 23s, lung, liver, skin  . Colon cancer Mother 96  . Bipolar disorder Father   . Cancer Father 42       colon cancer  . Dementia Father   . Colon cancer Father 47  . Thyroid disease Daughter   . Cancer Maternal Grandmother        ?  Marland Kitchen Heart disease Maternal Grandfather        MI, sudden  . Stroke  Paternal Grandmother 54  . Bipolar disorder Paternal Grandfather   . Dementia Paternal Grandfather   . Cancer Paternal Grandfather        colon  . Colon cancer Paternal Grandfather   . Obesity Brother   . Colon cancer Cousin 27  . Lung cancer Paternal Uncle   . Esophageal cancer Neg Hx   . Stomach cancer Neg Hx    Social History   Socioeconomic History  . Marital status: Married    Spouse name: [name]  . Number of children: 2  . Years of education: college  . Highest education level: Not on file  Occupational History  . Not on file  Tobacco Use  . Smoking status: Never Smoker  . Smokeless tobacco: Never Used  Vaping Use  . Vaping Use: Never used  Substance and Sexual Activity  . Alcohol use: Yes    Alcohol/week: 3.0 - 5.0 standard drinks    Types: 3 - 5 Standard drinks or equivalent per week    Comment: weekly 3-5 times per pt  . Drug use: No  . Sexual activity: Yes    Comment: lives with wife, works in [employer], part time at [employer], no dietary restrictions, exercises regularly  Other Topics Concern  .  Not on file  Social History Narrative   Patient works full time for [employer]. Patient lives at home with his wife [name]).   Education college.   Right handed.   Caffeine 4-6 cups daily.    Social Determinants of Health   Financial Resource Strain: Not on file  Food Insecurity: Not on file  Transportation Needs: Not on file  Physical Activity: Not on file  Stress: Not on file  Social Connections: Not on file   Past Surgical History:  Procedure Laterality Date  . COLONOSCOPY  2012   in [place]    . THORACIC DISCECTOMY Left 02/09/2018   Procedure: Microdiscectomy - Left - Thoracic Seven-Thoracid Eight - Thoracic Eight - Thoracic Nine;  Surgeon: Earnie Larsson, MD;  Location: [facility];  Service: Neurosurgery;  Laterality: Left;  Microdiscectomy - Left - Thoracic Seven-Thoracid Eight - Thoracic Eight - Thoracic Nine  .  VARICOCELE EXCISION    . VASECTOMY     Past Medical History:  Diagnosis Date  . Dizziness   . Elevated antinuclear antibody (ANA) level 11/21/2014  . Erectile dysfunction 11/21/2014  . Frequent headaches   . Hand pain, right 02/16/2017  . Headache 03/29/2014  . History of colonic polyps 11/21/2014  . Hyperlipidemia   . Knee pain, right 11/21/2014  . Low back pain 04/03/2015  . Motion sickness 11/21/2014  . Myelopathy ([place])    thoracic  . Obesity 02/16/2017  . Post-operative nausea and vomiting   . Preventative health care 11/21/2014   BP 131/87   Pulse (!) 55   Temp 98.6 F (37 C)   Ht 5' 10.5" (1.791 m)   Wt 222 lb (100.7 kg)   SpO2 98%   BMI 31.40 kg/m   Opioid Risk Score:   Fall Risk Score:  `1  Depression screen PHQ 2/9  Depression screen [facility] 2/9 04/18/2020 01/07/2020 12/23/2016  Decreased Interest 1 0 0  Down, Depressed, Hopeless 1 0 0  PHQ - 2 Score 2 0 0  Altered sleeping 3 0 -  Tired, decreased energy 2 0 -  Change in appetite 1 0 -  Feeling bad or failure about yourself  1 0 -  Trouble concentrating 3 0 -  Moving slowly or fidgety/restless 0 0 -  Suicidal thoughts 0 0 -  PHQ-9 Score 12 0 -  Difficult doing work/chores Somewhat difficult - -    Review of Systems     Objective:   Physical Exam Gen: no distress, normal appearing HEENT: oral mucosa pink and moist, NCAT Cardio: Reg rate Chest: normal effort, normal rate of breathing Abd: soft, non-distended Ext: no edema Skin: intact Neuro: Alert and oriented x3 Musculoskeletal: Tenderness to palpation over lumbar and thoracic spine.  Psych: pleasant, normal affect    Assessment & Plan:  Ronald Griffin is a 54 year old man who presents to establish care for the following conditions:  1) Chronic Pain Syndrome secondary to lumbar spinal stenosis -Discussed current symptoms of pain and history of pain.  -MRI reviewed with patient, from 10/21.  -Discussed following foods that may reduce pain: 1)  Ginger 2) Blueberries 3) Salmon 4) Pumpkin seeds 5) dark chocolate 6) turmeric 7) tart cherries 8) virgin olive oil 9) chilli peppers 10) mint  2) Insomnia: -frequent awakenings at night due to pain -Discussed Amitriptyline 10mg , will start HS and uptitrate as tolerated.

## 2020-05-05 ENCOUNTER — Encounter (HOSPITAL_COMMUNITY): Payer: Self-pay | Admitting: Psychiatry

## 2020-05-05 ENCOUNTER — Telehealth (INDEPENDENT_AMBULATORY_CARE_PROVIDER_SITE_OTHER): Payer: 59 | Admitting: Psychiatry

## 2020-05-05 ENCOUNTER — Telehealth: Payer: Self-pay | Admitting: Family Medicine

## 2020-05-05 DIAGNOSIS — R4586 Emotional lability: Secondary | ICD-10-CM

## 2020-05-05 DIAGNOSIS — F418 Other specified anxiety disorders: Secondary | ICD-10-CM | POA: Diagnosis not present

## 2020-05-05 DIAGNOSIS — F4323 Adjustment disorder with mixed anxiety and depressed mood: Secondary | ICD-10-CM

## 2020-05-05 NOTE — Telephone Encounter (Signed)
Caller: Terrence Dupont (Dr. Bobby Rumpf Office) [phone]   Dr. Bobby Rumpf is requesting a peer to peer to discuss patient limitation and restriction on disability claims

## 2020-05-05 NOTE — Telephone Encounter (Signed)
Peer to peer

## 2020-05-05 NOTE — Progress Notes (Signed)
Psychiatric Initial Adult Assessment   Patient Identification: Ronald Griffin MRN:  [id number] Date of Evaluation:  05/05/2020 Referral Source: primary care Chief Complaint:  establish care, mood symptoms Visit Diagnosis:    ICD-10-CM   1. Adjustment disorder with mixed anxiety and depressed mood  F43.23   2. Depression with anxiety  F41.8   3. Mood swings  R45.86   I connected with Ronald Griffin on 05/05/20 at 11:00 AM EST by telephone and verified that I am speaking with the correct person using two identifiers.  Location: Patient:parked car Provider: home office   I discussed the limitations, risks, security and privacy concerns of performing an evaluation and management service by telephone and the availability of in person appointments. I also discussed with the patient that there may be a patient responsible charge related to this service. The patient expressed understanding and agreed to proceed.  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 Patient has been feeling somewhat down at times but not hopeless or suicidal he feels worry full at times at times excessive but mostly worry about his finances and also since he is not working and also his back condition is affected his work.  He feels edgy at times irritable but more so in terms of when there is argument at home. He feels that he is getting short tempered or can react with agitation and wife has got worried that he may need to get evaluated as well.  Patient endorses that his wife suffers from  depression and has been difficult time doing the Christmas when her depression gets somewhat more worry full. 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  There are times when he get down subdued and there are times when he gets worry full.  Patient endorses having family history of bipolar he has never been diagnosed and has been doing reasonable active in sports when he was younger and active in his job.  Never had an evaluation done or the need for admission there is no history of prior suicide attempt or psychotic symptoms  He feels he does otherwise do okay unless there is an argument or conflict and sometimes it reverbates into mood swings   Aggravating factor: back pain and condition. Off work, relationship stress, medical co morbidity, finances Modifying factor: family, keeps busy  Duration more so last year  Past admission denies Alcohol use: more so over weekend, denies excessive   Remained cooperative during the interview with no irrational thoughts or agitation.  Past Psychiatric History: denies  Previous Psychotropic Medications: No   Substance Abuse History in the last 12 months:  No.  Consequences of Substance Abuse: NA  Past Medical History:  Past Medical History:  Diagnosis Date  .  Dizziness   . Elevated antinuclear antibody (ANA) level 11/21/2014  . Erectile dysfunction 11/21/2014  . Frequent headaches   . Hand pain, right 02/16/2017  . Headache 03/29/2014  . History of colonic polyps 11/21/2014  . Hyperlipidemia   . Knee pain, right 11/21/2014  . Low back pain 04/03/2015  . Motion sickness 11/21/2014  . Myelopathy ([facility])    thoracic  .  Obesity 02/16/2017  . Post-operative nausea and vomiting   . Preventative health care 11/21/2014    Past Surgical History:  Procedure Laterality Date  . COLONOSCOPY  2012   in [place]  . SKIN BIOPSY    . THORACIC DISCECTOMY Left 02/09/2018   Procedure: Microdiscectomy - Left - Thoracic Seven-Thoracid Eight - Thoracic Eight - Thoracic Nine;  Surgeon: Julio Sicks, MD;  [facility];  Service: Neurosurgery;  Laterality: Left;  Microdiscectomy - Left - Thoracic Seven-Thoracid Eight - Thoracic Eight - Thoracic Nine  . VARICOCELE EXCISION    . VASECTOMY      Family Psychiatric History: father bipolar Grand father : questionable bipolar  Family History:  Family History  Problem Relation Age of Onset  . Lung cancer Mother   . Breast cancer Mother   . Cancer Mother        breast age 55s, colon late 50s, lung, liver, skin  . Colon cancer Mother 24  . Bipolar disorder Father   . Cancer Father 54       colon cancer  . Dementia Father   . Colon cancer Father 48  . Thyroid disease Daughter   . Cancer Maternal Grandmother        ?  Marland Kitchen Heart disease Maternal Grandfather        MI, sudden  . Stroke Paternal Grandmother 28  . Bipolar disorder Paternal Grandfather   . Dementia Paternal Grandfather   . Cancer Paternal Grandfather        colon  . Colon cancer Paternal Grandfather   . Obesity Brother   . Colon cancer Cousin 45  . Lung cancer Paternal Uncle   . Esophageal cancer Neg Hx   . Stomach cancer Neg Hx     Social History:   Social History   Socioeconomic History  . Marital status: Married    Spouse name: Ronald Griffin  . Number of children: 2  . Years of education: college  . Highest education level: Not on file  Occupational History  . Not on file  Tobacco Use  . Smoking status: Never Smoker  . Smokeless tobacco: Never Used  Vaping Use  . Vaping Use: Never used  Substance and Sexual Activity  . Alcohol use: Yes    Alcohol/week: 3.0 - 5.0 standard drinks    Types: 3 - 5  Standard drinks or equivalent per week    Comment: weekly 3-5 times per pt  . Drug use: No  . Sexual activity: Yes    Comment: lives with wife, works in [employer], part time at [employer], no dietary restrictions, exercises regularly  Other Topics Concern  . Not on file  Social History Narrative   Patient works full time for [employer]. Patient lives at home with his wife Ronald Griffin).   Education college.   Right handed.   Caffeine 4-6 cups daily.    Social Determinants of Health   Financial Resource Strain: Not on file  Food Insecurity: Not on file  Transportation Needs: Not on file  Physical Activity: Not on file  Stress: Not on  file  Social Connections: Not on file    Additional Social History: grew up with parents, had 2 brothers, no abuse, middle class family, active in sports  Allergies:  No Known Allergies  Metabolic Disorder Labs: Lab Results  Component Value Date   HGBA1C 5.6 04/03/2020   No results found for: PROLACTIN Lab Results  Component Value Date   CHOL 234 (H) 04/03/2020   TRIG 94.0 04/03/2020   HDL 60.80 04/03/2020   CHOLHDL 4 04/03/2020   VLDL 18.8 04/03/2020   LDLCALC 154 (H) 04/03/2020   LDLCALC 131 (H) 11/09/2019   Lab Results  Component Value Date   TSH 5.35 (H) 04/03/2020    Therapeutic Level Labs: No results found for: LITHIUM No results found for: CBMZ No results found for: VALPROATE  Current Medications: Current Outpatient Medications  Medication Sig Dispense Refill  . amitriptyline (ELAVIL) 10 MG tablet Take 1 tablet (10 mg total) by mouth at bedtime. 30 tablet 1  . amphetamine-dextroamphetamine (ADDERALL XR) 20 MG 24 hr capsule Take 1 capsule (20 mg total) by mouth every morning. 30 capsule 0  . azelaic acid (AZELEX) 20 % cream Apply topically 2 (two) times daily. After skin is thoroughly washed and patted dry, gently but thoroughly massage a thin film of azelaic acid cream into the affected area twice daily,  in the morning and evening. 30 g 0  . levothyroxine (SYNTHROID) 25 MCG tablet Take 1 tablet (25 mcg total) by mouth daily before breakfast. 30 tablet 3  . minocycline (DYNACIN) 50 MG tablet Take 1 tablet (50 mg total) by mouth 2 (two) times daily. 30 tablet 3  . sildenafil (VIAGRA) 25 MG tablet Take 1 tablet (25 mg total) by mouth daily as needed for erectile dysfunction. 10 tablet 0  . tiZANidine (ZANAFLEX) 4 MG tablet Take 0.5-1 tablets (2-4 mg total) by mouth at bedtime as needed for muscle spasms. 30 tablet 0   No current facility-administered medications for this visit.     Psychiatric Specialty Exam: Review of Systems  Cardiovascular: Negative for chest pain.  Psychiatric/Behavioral: Positive for agitation. Negative for hallucinations and sleep disturbance.    There were no vitals taken for this visit.There is no height or weight on file to calculate BMI.  General Appearance:  Eye Contact:    Speech:  Normal Rate  Volume:  Normal  Mood:  Euthymic  Affect:  Congruent  Thought Process:  Goal Directed  Orientation:  Full (Time, Place, and Person)  Thought Content:  Rumination  Suicidal Thoughts:  No  Homicidal Thoughts:  No  Memory:  Immediate;   Fair Recent;   Fair  Judgement:  Fair  Insight:  Fair  Psychomotor Activity:  Normal  Concentration:  Concentration: Fair and Attention Span: Fair  Recall:  [employer] [facility] [place]: Fair  Akathisia:  No  Handed:    AIMS (if indicated):  not done  Assets:  Communication Skills Leisure Time  ADL's:  Intact  Cognition: WNL  Sleep:  Fair   Screenings: PHQ2-9   [place] Office Visit from 04/18/2020 in [facility] Video Visit from 01/07/2020 in [facility] Visit from 12/23/2016 in [place] at [employer]  PHQ-2 Total Score 2 0 0  PHQ-9 Total Score 12 0 -      Assessment and Plan: as  follows  Adjustment disorder with depressed mood and anxiety: patient want to hold off  from meds, no clear manic symptoms Reacts to stressors including relationship, back condition . Does not feel hopeless  Has therapy appointment, stressed to attend that   Mood swings: at times under stress or arguments. Discussed distractions and avoid conflicts Should consider therapy, if needed can consider mood stabilizer or SSRI for underlying stress   I discussed the assessment and treatment plan with the patient. The patient was provided an opportunity to ask questions and all were answered. The patient agreed with the plan and demonstrated an understanding of the instructions.   The patient was advised to call back or seek an in-person evaluation if the symptoms worsen or if the condition fails to improve as anticipated. FU 3-4 weeks  I provided 35 minutes of non-face-to-face time during this encounter. [name], MD 1/7/202211:41 AM

## 2020-05-07 NOTE — Telephone Encounter (Signed)
Please call and let them know I am out of Monday. I should be able to do a peer to peer on 05/10/20

## 2020-05-08 NOTE — Telephone Encounter (Signed)
Lvm to let them know when Dr. Charlett Blake will be available to do a peer to peer. Awaiting a call back to confirm.

## 2020-05-08 NOTE — Telephone Encounter (Signed)
Spoke with Ronald Griffin she said that it is fine to do the peer to peer 05/10/2020 but this is due the 05/11/20. She asked if you could please leave a cell phone number to be called back if you reach a voicemail for Dr. Bobby Rumpf. Ronald Griffin said if you would like to call before the 05/10/20 you could just leave a message and he will return your call.

## 2020-05-09 NOTE — Telephone Encounter (Signed)
Left a message for them to return a call to my cell phone #

## 2020-05-11 NOTE — Telephone Encounter (Signed)
Insurance called on 05/10/2020 at 12:30 pm while I was interviewing a candidate for a position with [place] and left a message so I called back after to have them call back and they did not. Have called again today and given the office number as a call back number. Spoke with Terrence Dupont and they will call us back

## 2020-05-11 NOTE — Telephone Encounter (Signed)
Spoke with Dr Bobby Rumpf. He asked if patient would have any physical limitations after October 2021 and our answer was yes. 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 Any even slightly strenuous work and he is unable to function for several days. He should not do any heavy lifting, repetitive movements, bending, walking or standing for any period of time.

## 2020-05-31 ENCOUNTER — Encounter: Payer: Self-pay | Admitting: Family Medicine

## 2020-06-01 ENCOUNTER — Encounter: Payer: 59 | Attending: Physical Medicine and Rehabilitation | Admitting: Physical Medicine and Rehabilitation

## 2020-06-01 ENCOUNTER — Encounter: Payer: Self-pay | Admitting: Physical Medicine and Rehabilitation

## 2020-06-01 ENCOUNTER — Other Ambulatory Visit: Payer: Self-pay

## 2020-06-01 ENCOUNTER — Telehealth: Payer: Self-pay

## 2020-06-01 DIAGNOSIS — G4701 Insomnia due to medical condition: Secondary | ICD-10-CM

## 2020-06-01 DIAGNOSIS — M48062 Spinal stenosis, lumbar region with neurogenic claudication: Secondary | ICD-10-CM | POA: Diagnosis not present

## 2020-06-01 MED ORDER — DULOXETINE HCL 20 MG PO CPEP: 20.0000 mg | ORAL_CAPSULE | Freq: Every day | ORAL | 1 refills | Status: DC

## 2020-06-01 NOTE — Progress Notes (Signed)
Subjective:    Patient ID: Ronald Griffin, male    DOB: Oct 20, 1965, 55 y.o.   MRN: [id number]  HPI  Due to national recommendations of social distancing because of COVID 31, an audio/video tele-health visit is felt to be the most appropriate encounter for this patient at this time. See MyChart message from today for the patient's consent to a tele-health encounter with [facility]. This is a follow up tele-visit via MyChart Video The patient is at home. MD is at office.   Ronald Griffin is a 55 year old man who presents for follow-up of lower back pain and sciatica.  1) Lumbar spinal stenosis: He had a fall in 2019 and underwent surgery by Dr. Trenton Gammon. He has since had back pain and neuropathy. He has done physical therapy. He uses a hot tub with a swimming trainer. MRI lumbar spine reviewed and shows the following: No new abnormality since the prior examination. Mild multilevel degenerative disc disease results in very mild central canal stenosis and mild left foraminal narrowing at L2-3.  -He has tried Gabapentin. He went up to 1100mg  and did not feel any benefit or side effects.   -Average pain is 7/10. His pain is constant, sharp, burning, tingling, and aching.   -He has never tried Cymbalta.   -He is trying to change his diet, decrease red meat to 3-4 times per week. He has been using Dr. Antionette Char shakes.   -He has been minimizing use of milk. He has been using organic.   -He tries to minimize sugar.   2) Insomnia: Last visit I prescribed Amitriptyline 10mg . It helped a lot for his sleep, but he had brain fog in the morning. He felt groggy in the morning so has only used it a couple of times.   Pain Inventory Average Pain 7 Pain Right Now 7 My pain is constant, sharp, burning, tingling and aching  In the last 24 hours, has pain interfered with the following? General activity 9 Relation with others 9 Enjoyment of life 10 What TIME of day is  your pain at its worst? morning , daytime, evening and night Sleep (in general) Fair  Pain is worse with: walking, bending, sitting, inactivity, standing and some activites Pain improves with: nothing Relief from Meds: na  how many minutes can you walk? 10 ability to climb steps?  yes do you drive?  yes  disabled: date disabled 08-02-17 I need assistance with the following:  household duties  weakness numbness tingling trouble walking spasms depression  New pt  New pt    Family History  Problem Relation Age of Onset  . Lung cancer Mother   . Breast cancer Mother   . Cancer Mother        breast age 73s, colon late 33s, lung, liver, skin  . Colon cancer Mother 84  . Bipolar disorder Father   . Cancer Father 22       colon cancer  . Dementia Father   . Colon cancer Father 32  . Thyroid disease Daughter   . Cancer Maternal Grandmother        ?  Marland Kitchen Heart disease Maternal Grandfather        MI, sudden  . Stroke Paternal Grandmother 79  . Bipolar disorder Paternal Grandfather   . Dementia Paternal Grandfather   . Cancer Paternal Grandfather        colon  . Colon cancer Paternal Grandfather   . Obesity Brother   .  Colon cancer Cousin 71  . Lung cancer Paternal Uncle   . Esophageal cancer Neg Hx   . Stomach cancer Neg Hx    Social History   Socioeconomic History  . Marital status: Married    Spouse name: [name]  . Number of children: 2  . Years of education: college  . Highest education level: Not on file  Occupational History  . Not on file  Tobacco Use  . Smoking status: Never Smoker  . Smokeless tobacco: Never Used  Vaping Use  . Vaping Use: Never used  Substance and Sexual Activity  . Alcohol use: Yes    Alcohol/week: 3.0 - 5.0 standard drinks    Types: 3 - 5 Standard drinks or equivalent per week    Comment: weekly 3-5 times per pt  . Drug use: No  . Sexual activity: Yes    Comment: lives with wife, works in [employer], part time at [employer], no dietary restrictions, exercises regularly  Other Topics Concern  . Not on file  Social History Narrative   Patient works full time for [employer]. Center. Patient lives at home with his wife [name]).   Education college.   Right handed.   Caffeine 4-6 cups daily.    Social Determinants of Health   Financial Resource Strain: Not on file  Food Insecurity: Not on file  Transportation Needs: Not on file  Physical Activity: Not on file  Stress: Not on file  Social Connections: Not on file   Past Surgical History:  Procedure Laterality Date  . COLONOSCOPY  2012   in [place]    . THORACIC DISCECTOMY Left 02/09/2018   Procedure: Microdiscectomy - Left - Thoracic Seven-Thoracid Eight - Thoracic Eight - Thoracic Nine;  Surgeon: Earnie Larsson, MD;  Location: [facility];  Service: Neurosurgery;  Laterality: Left;  Microdiscectomy - Left - Thoracic Seven-Thoracid Eight - Thoracic Eight - Thoracic Nine  . VARICOCELE EXCISION    . VASECTOMY     Past Medical History:  Diagnosis Date  . Dizziness   . Elevated antinuclear antibody (ANA) level 11/21/2014  . Erectile dysfunction 11/21/2014  . Frequent headaches   . Hand pain, right 02/16/2017  . Headache 03/29/2014  . History of colonic polyps 11/21/2014  . Hyperlipidemia   . Knee pain, right 11/21/2014  . Low back pain 04/03/2015  . Motion sickness 11/21/2014  . 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 Obesity 02/16/2017  . Post-operative nausea and vomiting   . Preventative health care 11/21/2014   There were no vitals taken for this visit.  Opioid Risk Score:   Fall Risk Score:  `1  Depression screen PHQ 2/9  Depression screen [facility] 2/9 04/18/2020 01/07/2020 12/23/2016  Decreased Interest 1 0 0  Down, Depressed, Hopeless 1 0 0  PHQ - 2 Score 2 0 0  Altered sleeping 3 0 -  Tired, decreased energy 2 0 -  Change in appetite 1 0 -  Feeling bad or failure about yourself  1 0 -  Trouble concentrating 3 0 -  Moving slowly or  fidgety/restless 0 0 -  Suicidal thoughts 0 0 -  PHQ-9 Score 12 0 -  Difficult doing work/chores Somewhat difficult - -    Review of Systems     Objective:   Physical Exam Not performed as patient was seen via video.     Assessment & Plan:  Ronald Griffin is a 55 year old man who presents to  establish care for the following conditions:  1) Chronic Pain Syndrome secondary to lumbar spinal stenosis, right>left.  -Discussed current symptoms of pain and history of pain.  -MRI reviewed with patient, from 10/21.  -Prescribed Cymbalta 20mg  HS daily. Discussed the possible side effect of nausea in the first couple of weeks.  -Discussed current symptoms of pain and history of pain.  -Discussed benefits of exercise in reducing pain. -He is trying to use a shake by Dr. . -Can consider ESI in the future if medications and lifestyle changes are not helping enough.  -He has tried Keto and intermittent fasting. He went from a size 44 to 34 with this.  -Provided advice regarding avoiding meats that are given antibiotics.  -Encouraged exercise and discussed the benefits of increased blood flow, endorphins, general health, and pain relief.  -Discussed following foods that may reduce pain: 1) Ginger 2) Blueberries 3) Salmon 4) Pumpkin seeds 5) dark chocolate 6) turmeric- recommended this one in particular and he wants to get this.  7) tart cherries 8) virgin olive oil 9) chilli peppers 10) mint  Link to further information on diet for chronic pain: Ethelene Browns  Turmeric to reduce inflammation--can be used in cooking or taken as a supplement.  Benefits of turmeric:  -Highly anti-inflammatory  -Increases antioxidants  -Improves memory, attention, brain disease  -Lowers risk of heart disease  -May help prevent cancer  -Decreases pain  -Alleviates depression  -Delays aging and decreases risk of  chronic disease  -Consume with black pepper to increase absorption    Turmeric Milk Recipe:  1 cup milk  1 tsp turmeric  1 tsp cinnamon  1 tsp grated ginger (optional)  Black pepper (boosts the anti-inflammatory properties of turmeric).  1 tsp honey   2) Insomnia: -frequent awakenings at night due to pain -Amitriptyline HS 10mg  made him too sleepy.  -Try CBD at night.

## 2020-06-01 NOTE — Telephone Encounter (Signed)
Sent the number to his mychart

## 2020-06-06 ENCOUNTER — Telehealth (INDEPENDENT_AMBULATORY_CARE_PROVIDER_SITE_OTHER): Payer: 59 | Admitting: Psychiatry

## 2020-06-06 ENCOUNTER — Encounter (HOSPITAL_COMMUNITY): Payer: Self-pay | Admitting: Psychiatry

## 2020-06-06 DIAGNOSIS — F4323 Adjustment disorder with mixed anxiety and depressed mood: Secondary | ICD-10-CM

## 2020-06-06 DIAGNOSIS — F418 Other specified anxiety disorders: Secondary | ICD-10-CM

## 2020-06-06 DIAGNOSIS — R4586 Emotional lability: Secondary | ICD-10-CM

## 2020-06-06 NOTE — Progress Notes (Signed)
[name]  Patient Identification: Ronald Griffin MRN:  [id number] Date of Evaluation:  06/06/2020 Referral Source: primary care Chief Complaint:  Mood symptoms follow up  Visit Diagnosis:    ICD-10-CM   1. Depression with anxiety  F41.8   2. Adjustment disorder with mixed anxiety and depressed mood  F43.23   3. Mood swings  R45.86    Virtual Visit via Video Note  I connected with Ronald Griffin on 06/06/20 at  9:30 AM EST by a video enabled telemedicine application and verified that I am speaking with the correct person using two identifiers.  Location: Patient: home with wife Provider: home office   I discussed the limitations of evaluation and management by telemedicine and the availability of in person appointments. The patient expressed understanding and agreed to proceed.      I discussed the assessment and treatment plan with the patient. The patient was provided an opportunity to ask questions and all were answered. The patient agreed with the plan and demonstrated an understanding of the instructions.   The patient was advised to call back or seek an in-person evaluation if the symptoms worsen or if the condition fails to improve as anticipated.  I provided 14  minutes of non-face-to-face time during this encounter.   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 Family history of bipolar but not endorse mania    Aggravating factor: back pain and condition. Off work, relationship stress, medical co morbidity, finances Modifying factor: family   Remained cooperative during the interview with no irrational thoughts or agitation.  Past Psychiatric History: denies  Previous Psychotropic Medications: No   Substance Abuse History in the last 12 months:  No.  Consequences of Substance Abuse: NA  Past Medical History:  Past Medical History:  Diagnosis Date  . Dizziness   . Elevated antinuclear antibody (ANA) level 11/21/2014  . Erectile dysfunction 11/21/2014  . Frequent headaches   . Hand pain, right 02/16/2017  . Headache 03/29/2014  . History of colonic polyps 11/21/2014  . Hyperlipidemia   . Knee pain, right 11/21/2014  . Low back pain 04/03/2015  . Motion sickness 11/21/2014  . 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 Obesity 02/16/2017  . Post-operative nausea and vomiting   . Preventative health care 11/21/2014    Past Surgical History:  Procedure Laterality Date  . COLONOSCOPY  2012   in [place]    . THORACIC DISCECTOMY Left 02/09/2018   Procedure: Microdiscectomy - Left - Thoracic Seven-Thoracid Eight - Thoracic Eight - Thoracic Nine;  Surgeon: Earnie Larsson, MD;  Location: [facility];  Service: Neurosurgery;  Laterality: Left;  Microdiscectomy - Left - Thoracic Seven-Thoracid Eight - Thoracic Eight - Thoracic Nine  . VARICOCELE EXCISION    .  VASECTOMY       Family History:  Family History  Problem Relation Age of Onset  . Lung cancer Mother   . Breast cancer Mother   . Cancer Mother        breast age 1s, colon late 39s, lung, liver, skin  . Colon cancer Mother 90  . Bipolar disorder Father   . Cancer Father 16       colon cancer  . Dementia Father   . Colon cancer Father 62  . Thyroid disease Daughter   . Cancer Maternal Grandmother         ?  Marland Kitchen Heart disease Maternal Grandfather        MI, sudden  . Stroke Paternal Grandmother 67  . Bipolar disorder Paternal Grandfather   . Dementia Paternal Grandfather   . Cancer Paternal Grandfather        colon  . Colon cancer Paternal Grandfather   . Obesity Brother   . Colon cancer Cousin 80  . Lung cancer Paternal Uncle   . Esophageal cancer Neg Hx   . Stomach cancer Neg Hx     Social History:   Social History   Socioeconomic History  . Marital status: Married    Spouse name: Ronald Griffin  . Number of children: 2  . Years of education: college  . Highest education level: Not on file  Occupational History  . Not on file  Tobacco Use  . Smoking status: Never Smoker  . Smokeless tobacco: Never Used  Vaping Use  . Vaping Use: Never used  Substance and Sexual Activity  . Alcohol use: Yes    Alcohol/week: 3.0 - 5.0 standard drinks    Types: 3 - 5 Standard drinks or equivalent per week    Comment: weekly 3-5 times per pt  . Drug use: No  . Sexual activity: Yes    Comment: lives with wife, works in [employer], part time at [employer], no dietary restrictions, exercises regularly  Other Topics Concern  . Not on file  Social History Narrative   Patient works full time for [employer]. Center. Patient lives at home with his wife Ronald Griffin).   Education college.   Right handed.   Caffeine 4-6 cups daily.    Social Determinants of Health   Financial Resource Strain: Not on file  Food Insecurity: Not on file  Transportation Needs: Not on file  Physical Activity: Not on file  Stress: Not on file  Social Connections: Not on file     Allergies:  No Known Allergies  Metabolic Disorder Labs: Lab Results  Component Value Date   HGBA1C 5.6 04/03/2020   No results found for: PROLACTIN Lab Results  Component Value Date   CHOL 234 (H) 04/03/2020   TRIG 94.0 04/03/2020   HDL 60.80 04/03/2020   CHOLHDL 4 04/03/2020   VLDL 18.8 04/03/2020   LDLCALC 154 (H)  04/03/2020   LDLCALC 131 (H) 11/09/2019   Lab Results  Component Value Date   TSH 5.35 (H) 04/03/2020    Therapeutic Level Labs: No results found for: LITHIUM No results found for: CBMZ No results found for: VALPROATE  Current Medications: Current Outpatient Medications  Medication Sig Dispense Refill  . amphetamine-dextroamphetamine (ADDERALL XR) 20 MG 24 hr capsule Take 1 capsule (20 mg total) by mouth every morning. 30 capsule 0  . azelaic acid (AZELEX) 20 % cream Apply topically 2 (two) times daily. After skin is thoroughly washed and patted dry, gently but thoroughly massage  a thin film of azelaic acid cream into the affected area twice daily, in the morning and evening. 30 g 0  . DULoxetine (CYMBALTA) 20 MG capsule Take 1 capsule (20 mg total) by mouth daily. 30 capsule 1  . levothyroxine (SYNTHROID) 25 MCG tablet Take 1 tablet (25 mcg total) by mouth daily before breakfast. 30 tablet 3  . minocycline (DYNACIN) 50 MG tablet Take 1 tablet (50 mg total) by mouth 2 (two) times daily. 30 tablet 3  . sildenafil (VIAGRA) 25 MG tablet Take 1 tablet (25 mg total) by mouth daily as needed for erectile dysfunction. 10 tablet 0  . tiZANidine (ZANAFLEX) 4 MG tablet Take 0.5-1 tablets (2-4 mg total) by mouth at bedtime as needed for muscle spasms. 30 tablet 0   No current facility-administered medications for this visit.     Psychiatric Specialty Exam: Review of Systems  Cardiovascular: Negative for chest pain.  Psychiatric/Behavioral: Positive for agitation. Negative for hallucinations and sleep disturbance.    There were no vitals taken for this visit.There is no height or weight on file to calculate BMI.  General Appearance: casual  Eye Contact:  fair  Speech:  Normal Rate  Volume:  Normal  Mood:  Euthymic  Affect:  Congruent  Thought Process:  Goal Directed  Orientation:  Full (Time, Place, and Person)  Thought Content:  Rumination  Suicidal Thoughts:  No  Homicidal Thoughts:   No  Memory:  Immediate;   Fair Recent;   Fair  Judgement:  Fair  Insight:  Fair  Psychomotor Activity:  Normal  Concentration:  Concentration: Fair and Attention Span: Fair  Recall:  [employer] [place]: Fair  Akathisia:  No  Handed:    AIMS (if indicated):  not done  Assets:  Communication Skills Leisure Time  ADL's:  Intact  Cognition: WNL  Sleep:  Fair   Screenings: PHQ2-9   [place] Office Visit from 04/18/2020 in [facility] Video Visit from 01/07/2020 in [facility] Visit from 12/23/2016 in [place] at [employer]  PHQ-2 Total Score 2 0 0  PHQ-9 Total Score 12 0 --      Assessment and Plan: as follows Prior documentation reviewed  Adjustment disorder with depressed mood and anxiety: gets subdued or edgy at times, will wait for cymbalta effects for now, starting today for pain Reacts to stressors including relationship, back condition . Does not feel hopeless  Has therapy appointment, stressed to attend that   Mood swings: less frequent, feels distracted, helping wife out, we talked about seeing effect of cymbalta for pain first May consider mood stabilizer or stimulant if needed  Fu 6 weeks or earlier if needed  Ronald Capron, MD 2/8/20229:45 AM

## 2020-06-26 ENCOUNTER — Other Ambulatory Visit: Payer: Self-pay | Admitting: Family Medicine

## 2020-06-27 MED ORDER — AMPHETAMINE-DEXTROAMPHET ER 20 MG PO CP24: 20.0000 mg | ORAL_CAPSULE | ORAL | 0 refills | Status: DC

## 2020-06-27 NOTE — Telephone Encounter (Signed)
Requesting: Adderall XR 20mg  Contract: 04/03/2020 UDS: None Last Visit: 04/14/2020 Next Visit: 07/17/2020 Last Refill: 04/17/2020 #30 and 0RF  Please Advise

## 2020-07-10 ENCOUNTER — Encounter: Payer: 59 | Attending: Physical Medicine and Rehabilitation | Admitting: Physical Medicine and Rehabilitation

## 2020-07-10 DIAGNOSIS — G4701 Insomnia due to medical condition: Secondary | ICD-10-CM | POA: Insufficient documentation

## 2020-07-10 DIAGNOSIS — M48062 Spinal stenosis, lumbar region with neurogenic claudication: Secondary | ICD-10-CM | POA: Insufficient documentation

## 2020-07-13 ENCOUNTER — Other Ambulatory Visit: Payer: Self-pay

## 2020-07-13 ENCOUNTER — Other Ambulatory Visit (INDEPENDENT_AMBULATORY_CARE_PROVIDER_SITE_OTHER): Payer: 59

## 2020-07-13 DIAGNOSIS — E039 Hypothyroidism, unspecified: Secondary | ICD-10-CM | POA: Diagnosis not present

## 2020-07-13 DIAGNOSIS — E785 Hyperlipidemia, unspecified: Secondary | ICD-10-CM | POA: Diagnosis not present

## 2020-07-13 LAB — COMPREHENSIVE METABOLIC PANEL
ALT: 20 U/L (ref 0–53)
AST: 19 U/L (ref 0–37)
Albumin: 4.3 g/dL (ref 3.5–5.2)
Alkaline Phosphatase: 67 U/L (ref 39–117)
BUN: 19 mg/dL (ref 6–23)
CO2: 28 mEq/L (ref 19–32)
Calcium: 9.2 mg/dL (ref 8.4–10.5)
Chloride: 102 mEq/L (ref 96–112)
Creatinine, Ser: 0.86 mg/dL (ref 0.40–1.50)
GFR: 96.81 mL/min (ref >60.00)
Glucose, Bld: 96 mg/dL (ref 70–99)
Potassium: 4.4 mEq/L (ref 3.5–5.1)
Sodium: 138 mEq/L (ref 135–145)
Total Bilirubin: 0.9 mg/dL (ref 0.2–1.2)
Total Protein: 7 g/dL (ref 6.0–8.3)

## 2020-07-13 LAB — LIPID PANEL
Cholesterol: 244 mg/dL — ABNORMAL HIGH (ref 0–200)
HDL: 65.7 mg/dL (ref >39.00)
LDL Cholesterol: 160 mg/dL — ABNORMAL HIGH (ref 0–99)
NonHDL: 178.74
Total CHOL/HDL Ratio: 4
Triglycerides: 95 mg/dL (ref 0.0–149.0)
VLDL: 19 mg/dL (ref 0.0–40.0)

## 2020-07-13 LAB — TSH: TSH: 4.18 u[IU]/mL (ref 0.35–4.50)

## 2020-07-13 LAB — T4, FREE: Free T4: 0.61 ng/dL (ref 0.60–1.60)

## 2020-07-17 ENCOUNTER — Other Ambulatory Visit: Payer: Self-pay

## 2020-07-17 ENCOUNTER — Telehealth (INDEPENDENT_AMBULATORY_CARE_PROVIDER_SITE_OTHER): Payer: 59 | Admitting: Family Medicine

## 2020-07-17 DIAGNOSIS — G8929 Other chronic pain: Secondary | ICD-10-CM

## 2020-07-17 DIAGNOSIS — R739 Hyperglycemia, unspecified: Secondary | ICD-10-CM | POA: Diagnosis not present

## 2020-07-17 DIAGNOSIS — M549 Dorsalgia, unspecified: Secondary | ICD-10-CM

## 2020-07-17 DIAGNOSIS — E785 Hyperlipidemia, unspecified: Secondary | ICD-10-CM

## 2020-07-17 DIAGNOSIS — R351 Nocturia: Secondary | ICD-10-CM

## 2020-07-17 DIAGNOSIS — R7989 Other specified abnormal findings of blood chemistry: Secondary | ICD-10-CM | POA: Diagnosis not present

## 2020-07-17 DIAGNOSIS — E669 Obesity, unspecified: Secondary | ICD-10-CM

## 2020-07-17 DIAGNOSIS — L719 Rosacea, unspecified: Secondary | ICD-10-CM

## 2020-07-17 DIAGNOSIS — E039 Hypothyroidism, unspecified: Secondary | ICD-10-CM

## 2020-07-17 MED ORDER — LEVOTHYROXINE SODIUM 25 MCG PO TABS: 25.0000 ug | ORAL_TABLET | Freq: Every day | ORAL | 1 refills | Status: DC

## 2020-07-17 NOTE — Assessment & Plan Note (Signed)
Has had some good weight loss with dietary changes.

## 2020-07-17 NOTE — Assessment & Plan Note (Signed)
Tolerating Levothyroxine 25 mcg and daily well. Thyroid studies now normal continue to monitor

## 2020-07-17 NOTE — Assessment & Plan Note (Signed)
No current concerns

## 2020-07-17 NOTE — Assessment & Plan Note (Signed)
Encouraged heart healthy diet, increase exercise, avoid trans fats, consider a krill oil cap daily. He has but has not been taking his krill oil. He is aware his total cholesterol has trended up and if it continues to go up we may need to reconsider medications.

## 2020-07-17 NOTE — Progress Notes (Signed)
MyChart Video Visit    Virtual Visit via Video Note   This visit type was conducted due to national recommendations for restrictions regarding the COVID-19 Pandemic (e.g. social distancing) in an effort to limit this patient's exposure and mitigate transmission in our community. This patient is at least at moderate risk for complications without adequate follow up. This format is felt to be most appropriate for this patient at this time. Physical exam was limited by quality of the video and audio technology used for the visit. S Chism, CMA was able to get the patient set up on a video visit.  Patient location: home, patient and provider in visit Provider location: work  I discussed the limitations of evaluation and management by telemedicine and the availability of in person appointments. The patient expressed understanding and agreed to proceed.  Visit Date: 07/17/2020  Today's healthcare provider: Penni Homans, MD     Subjective:    Patient ID: Ronald Griffin, male    DOB: July 25, 1965, 55 y.o.   MRN: [id number]  No chief complaint on file.   HPI Patient is in today for follow up on chronic medical concerns. No recent febrile illness or hospitalizations. He is now working with sports medicine, pain management and physical therapy and is still struggling with low back pain and radiculopathy. Whenever he ups his activity his pain in the back and numbness in his feet get worse again for several days. No c/o incontinence. Denies CP/palp/SOB/HA/congestion/fevers/GI or GU c/o. Taking meds as prescribed  Past Medical History:  Diagnosis Date  . Dizziness   . Elevated antinuclear antibody (ANA) level 11/21/2014  . Erectile dysfunction 11/21/2014  . Frequent headaches   . Hand pain, right 02/16/2017  . Headache 03/29/2014  . History of colonic polyps 11/21/2014  . Hyperlipidemia   . Knee pain, right 11/21/2014  . Low back pain 04/03/2015  . Motion sickness 11/21/2014  . 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 Obesity 02/16/2017  . Post-operative nausea and vomiting   . Preventative health care 11/21/2014    Past Surgical History:  Procedure Laterality Date  . COLONOSCOPY  2012   in [place]    . THORACIC DISCECTOMY Left 02/09/2018   Procedure: Microdiscectomy - Left - Thoracic Seven-Thoracid Eight - Thoracic Eight - Thoracic Nine;  Surgeon: Earnie Larsson, MD;  Location: [facility];  Service: Neurosurgery;  Laterality: Left;  Microdiscectomy - Left - Thoracic Seven-Thoracid Eight - Thoracic Eight - Thoracic Nine  . VARICOCELE EXCISION    . VASECTOMY      Family History  Problem Relation Age of Onset  . Lung cancer Mother   . Breast cancer Mother   . Cancer Mother        breast age 43s, colon late 62s, lung, liver, skin  . Colon cancer Mother 83  . Bipolar disorder Father   . Cancer Father 58       colon cancer  . Dementia Father   . Colon cancer Father 28  . Thyroid disease Daughter   . Cancer Maternal Grandmother        ?  Marland Kitchen Heart disease Maternal Grandfather        MI, sudden  . Stroke Paternal Grandmother 75  . Bipolar disorder Paternal Grandfather   . Dementia Paternal Grandfather   . Cancer Paternal Grandfather        colon  . Colon cancer Paternal Grandfather   . Obesity Brother   .  Colon cancer Cousin 59  . Lung cancer Paternal Uncle   . Esophageal cancer Neg Hx   . Stomach cancer Neg Hx     Social History   Socioeconomic History  . Marital status: Married    Spouse name: [name]  . Number of children: 2  . Years of education: college  . Highest education level: Not on file  Occupational History  . Not on file  Tobacco Use  . Smoking status: Never Smoker  . Smokeless tobacco: Never Used  Vaping Use  . Vaping Use: Never used  Substance and Sexual Activity  . Alcohol use: Yes    Alcohol/week: 3.0 - 5.0 standard drinks    Types: 3 - 5 Standard drinks or equivalent per week    Comment: weekly 3-5 times per pt  . Drug use: No  .  Sexual activity: Yes    Comment: lives with wife, works in [employer], part time at [employer], no dietary restrictions, exercises regularly  Other Topics Concern  . Not on file  Social History Narrative   Patient works full time for [employer]. Patient lives at home with his wife [name]).   Education college.   Right handed.   Caffeine 4-6 cups daily.    Social Determinants of Health   Financial Resource Strain: Not on file  Food Insecurity: Not on file  Transportation Needs: Not on file  Physical Activity: Not on file  Stress: Not on file  Social Connections: Not on file  Intimate Partner Violence: Not on file    Outpatient Medications Prior to Visit  Medication Sig Dispense Refill  . amphetamine-dextroamphetamine (ADDERALL XR) 20 MG 24 hr capsule Take 1 capsule (20 mg total) by mouth every morning. 30 capsule 0  . azelaic acid (AZELEX) 20 % cream Apply topically 2 (two) times daily. After skin is thoroughly washed and patted dry, gently but thoroughly massage a thin film of azelaic acid cream into the affected area twice daily, in the morning and evening. 30 g 0  . DULoxetine (CYMBALTA) 20 MG capsule Take 1 capsule (20 mg total) by mouth daily. 30 capsule 1  . minocycline (DYNACIN) 50 MG tablet Take 1 tablet (50 mg total) by mouth 2 (two) times daily. 30 tablet 3  . sildenafil (VIAGRA) 25 MG tablet Take 1 tablet (25 mg total) by mouth daily as needed for erectile dysfunction. 10 tablet 0  . tiZANidine (ZANAFLEX) 4 MG tablet Take 0.5-1 tablets (2-4 mg total) by mouth at bedtime as needed for muscle spasms. 30 tablet 0  . levothyroxine (SYNTHROID) 25 MCG tablet Take 1 tablet (25 mcg total) by mouth daily before breakfast. 30 tablet 3   No facility-administered medications prior to visit.    No Known Allergies  Review of Systems  Constitutional: Negative for fever and malaise/fatigue.  HENT: Negative for congestion.   Eyes: Negative for blurred vision.   Respiratory: Negative for shortness of breath.   Cardiovascular: Negative for chest pain, palpitations and leg swelling.  Gastrointestinal: Negative for abdominal pain, blood in stool and nausea.  Genitourinary: Negative for dysuria and frequency.  Musculoskeletal: Positive for back pain and myalgias. Negative for falls.  Skin: Negative for rash.  Neurological: Positive for sensory change. Negative for dizziness, loss of consciousness and headaches.  Endo/Heme/Allergies: Negative for environmental allergies.  Psychiatric/Behavioral: Negative for depression. The patient is not nervous/anxious.        Objective:    Physical Exam Constitutional:      Appearance:  Normal appearance. He is not ill-appearing or toxic-appearing.  HENT:     Head: Normocephalic and atraumatic.     Right Ear: External ear normal.     Left Ear: External ear normal.     Nose: Nose normal.  Eyes:     General:        Right eye: No discharge.        Left eye: No discharge.     Conjunctiva/sclera: Conjunctivae normal.  Pulmonary:     Effort: Pulmonary effort is normal.  Neurological:     Mental Status: He is alert and oriented to person, place, and time.  Psychiatric:        Behavior: Behavior normal.     There were no vitals taken for this visit. Wt Readings from Last 3 Encounters:  04/18/20 222 lb (100.7 kg)  04/03/20 222 lb 9.6 oz (101 kg)  02/17/20 222 lb (100.7 kg)    Diabetic Foot Exam - Simple   No data filed    Lab Results  Component Value Date   WBC 4.6 04/03/2020   HGB 14.7 04/03/2020   HCT 43.2 04/03/2020   PLT 202.0 04/03/2020   GLUCOSE 96 07/13/2020   CHOL 244 (H) 07/13/2020   TRIG 95.0 07/13/2020   HDL 65.70 07/13/2020   LDLDIRECT 140.0 02/14/2017   LDLCALC 160 (H) 07/13/2020   ALT 20 07/13/2020   AST 19 07/13/2020   NA 138 07/13/2020   K 4.4 07/13/2020   CL 102 07/13/2020   CREATININE 0.86 07/13/2020   BUN 19 07/13/2020   CO2 28 07/13/2020   TSH 4.18 07/13/2020    PSA 0.61 11/09/2019   HGBA1C 5.6 04/03/2020    Lab Results  Component Value Date   TSH 4.18 07/13/2020   Lab Results  Component Value Date   WBC 4.6 04/03/2020   HGB 14.7 04/03/2020   HCT 43.2 04/03/2020   MCV 91.7 04/03/2020   PLT 202.0 04/03/2020   Lab Results  Component Value Date   NA 138 07/13/2020   K 4.4 07/13/2020   CO2 28 07/13/2020   GLUCOSE 96 07/13/2020   BUN 19 07/13/2020   CREATININE 0.86 07/13/2020   BILITOT 0.9 07/13/2020   ALKPHOS 67 07/13/2020   AST 19 07/13/2020   ALT 20 07/13/2020   PROT 7.0 07/13/2020   ALBUMIN 4.3 07/13/2020   CALCIUM 9.2 07/13/2020   ANIONGAP 7 02/09/2018   GFR 96.81 07/13/2020   Lab Results  Component Value Date   CHOL 244 (H) 07/13/2020   Lab Results  Component Value Date   HDL 65.70 07/13/2020   Lab Results  Component Value Date   LDLCALC 160 (H) 07/13/2020   Lab Results  Component Value Date   TRIG 95.0 07/13/2020   Lab Results  Component Value Date   CHOLHDL 4 07/13/2020   Lab Results  Component Value Date   HGBA1C 5.6 04/03/2020       Assessment & Plan:   Problem List Items Addressed This Visit    Hyperlipidemia - Primary    Encouraged heart healthy diet, increase exercise, avoid trans fats, consider a krill oil cap daily. He has but has not been taking his krill oil. He is aware his total cholesterol has trended up and if it continues to go up we may need to reconsider medications.       Relevant Orders   Lipid panel   Back pain    Is following with sports medicine and pain management now. Dr  Raulkar and he is doing some PT some days he feels it is helping other days not so much. He continues to note whenever he tries to up his activity level his symptoms worsen. He did a little work at the house yesterday and today his back pain is worse and his feet are numb again      Relevant Orders   CBC   PSA   Obesity    Has had some good weight loss with dietary changes.       Hypothyroid     Tolerating Levothyroxine 25 mcg and daily well. Thyroid studies now normal continue to monitor      Relevant Medications   levothyroxine (SYNTHROID) 25 MCG tablet   Rosacea, acne    No current concerns.        Other Visit Diagnoses    Hyperglycemia       Relevant Orders   Hemoglobin A1c   Comprehensive metabolic panel   Nocturia       Relevant Orders   PSA      I am having Pablo Ledger. Haden maintain his Azelex, sildenafil, minocycline, tiZANidine, DULoxetine, amphetamine-dextroamphetamine, and levothyroxine.  Meds ordered this encounter  Medications  . levothyroxine (SYNTHROID) 25 MCG tablet    Sig: Take 1 tablet (25 mcg total) by mouth daily before breakfast.    Dispense:  90 tablet    Refill:  1    Please d/c rx for 22mcg capsules    I discussed the assessment and treatment plan with the patient. The patient was provided an opportunity to ask questions and all were answered. The patient agreed with the plan and demonstrated an understanding of the instructions.   The patient was advised to call back or seek an in-person evaluation if the symptoms worsen or if the condition fails to improve as anticipated.  I provided 20 minutes of non-face-to-face time during this encounter.   Penni Homans, MD [facility] [phone] (phone) [phone] (fax)  [place]

## 2020-07-17 NOTE — Assessment & Plan Note (Signed)
Is following with sports medicine and pain management now. Dr Ranell Patrick and he is doing some PT some days he feels it is helping other days not so much. He continues to note whenever he tries to up his activity level his symptoms worsen. He did a little work at the house yesterday and today his back pain is worse and his feet are numb again

## 2020-07-18 ENCOUNTER — Telehealth: Payer: Self-pay

## 2020-07-18 NOTE — Telephone Encounter (Signed)
Called pt to schedule nurse and lab visit see wrap-up from 07/18/19

## 2020-07-18 NOTE — Telephone Encounter (Signed)
error 

## 2020-07-19 ENCOUNTER — Telehealth (HOSPITAL_COMMUNITY): Payer: 59 | Admitting: Psychiatry

## 2020-07-24 ENCOUNTER — Ambulatory Visit (INDEPENDENT_AMBULATORY_CARE_PROVIDER_SITE_OTHER): Payer: 59 | Admitting: Psychology

## 2020-07-24 DIAGNOSIS — F4322 Adjustment disorder with anxiety: Secondary | ICD-10-CM

## 2020-07-31 ENCOUNTER — Ambulatory Visit (INDEPENDENT_AMBULATORY_CARE_PROVIDER_SITE_OTHER): Payer: 59 | Admitting: Psychology

## 2020-07-31 DIAGNOSIS — F4322 Adjustment disorder with anxiety: Secondary | ICD-10-CM | POA: Diagnosis not present

## 2020-08-14 ENCOUNTER — Ambulatory Visit (INDEPENDENT_AMBULATORY_CARE_PROVIDER_SITE_OTHER): Payer: 59 | Admitting: Psychology

## 2020-08-14 ENCOUNTER — Other Ambulatory Visit: Payer: Self-pay | Admitting: Family Medicine

## 2020-08-14 DIAGNOSIS — F4322 Adjustment disorder with anxiety: Secondary | ICD-10-CM | POA: Diagnosis not present

## 2020-08-14 MED ORDER — AMPHETAMINE-DEXTROAMPHET ER 20 MG PO CP24: 20.0000 mg | ORAL_CAPSULE | ORAL | 0 refills | Status: DC

## 2020-08-14 NOTE — Telephone Encounter (Signed)
Requesting: Adderall XR 20mg  Contract: 04/03/2020 UDS: None Last Visit: 07/17/2020 Next Visit: None Last Refill: 06/27/2020 #30 and 0RF  Please Advise

## 2020-08-16 ENCOUNTER — Ambulatory Visit (INDEPENDENT_AMBULATORY_CARE_PROVIDER_SITE_OTHER): Payer: 59 | Admitting: Psychology

## 2020-08-16 DIAGNOSIS — F4322 Adjustment disorder with anxiety: Secondary | ICD-10-CM

## 2020-08-17 ENCOUNTER — Ambulatory Visit: Payer: 59 | Admitting: Psychology

## 2020-08-21 ENCOUNTER — Ambulatory Visit (INDEPENDENT_AMBULATORY_CARE_PROVIDER_SITE_OTHER): Payer: 59 | Admitting: Psychology

## 2020-08-21 DIAGNOSIS — F4322 Adjustment disorder with anxiety: Secondary | ICD-10-CM

## 2020-08-28 ENCOUNTER — Ambulatory Visit: Payer: 59 | Admitting: Psychology

## 2020-10-08 ENCOUNTER — Other Ambulatory Visit: Payer: Self-pay | Admitting: Family Medicine

## 2020-10-09 ENCOUNTER — Ambulatory Visit (INDEPENDENT_AMBULATORY_CARE_PROVIDER_SITE_OTHER): Payer: 59 | Admitting: Psychology

## 2020-10-09 DIAGNOSIS — F4322 Adjustment disorder with anxiety: Secondary | ICD-10-CM

## 2020-10-09 MED ORDER — AMPHETAMINE-DEXTROAMPHET ER 20 MG PO CP24: 20.0000 mg | ORAL_CAPSULE | ORAL | 0 refills | Status: DC

## 2020-10-09 NOTE — Telephone Encounter (Signed)
Patient is requesting a refill of the following medications: Requested Prescriptions   Pending Prescriptions Disp Refills   amphetamine-dextroamphetamine (ADDERALL XR) 20 MG 24 hr capsule 30 capsule 0    Sig: Take 1 capsule (20 mg total) by mouth every morning.    Date of patient request: 10/08/20 Last office visit: 07/17/20 VV Date of last refill: 08/14/20 Last refill amount: 30 + 0 Follow up time period per chart: None

## 2020-11-23 ENCOUNTER — Telehealth: Payer: Self-pay | Admitting: Family Medicine

## 2020-11-23 NOTE — Telephone Encounter (Signed)
Patient had a note from dec 2021 (work note) left in office I mailed it too him

## 2020-12-17 ENCOUNTER — Encounter: Payer: Self-pay | Admitting: Internal Medicine

## 2020-12-21 ENCOUNTER — Other Ambulatory Visit: Payer: Self-pay | Admitting: Family Medicine

## 2020-12-22 MED ORDER — AMPHETAMINE-DEXTROAMPHET ER 20 MG PO CP24: 20.0000 mg | ORAL_CAPSULE | ORAL | 0 refills | Status: DC

## 2020-12-22 NOTE — Telephone Encounter (Signed)
Requesting: adderall  Contract:04/03/20 UDS: unknown Last Visit:07/17/20 Next Visit:12/26/20 Last Refill:10/09/20  Please Advise

## 2020-12-25 ENCOUNTER — Other Ambulatory Visit: Payer: Self-pay | Admitting: Physical Medicine and Rehabilitation

## 2020-12-26 ENCOUNTER — Telehealth (INDEPENDENT_AMBULATORY_CARE_PROVIDER_SITE_OTHER): Payer: 59 | Admitting: Family Medicine

## 2020-12-26 ENCOUNTER — Other Ambulatory Visit: Payer: Self-pay

## 2020-12-26 DIAGNOSIS — E669 Obesity, unspecified: Secondary | ICD-10-CM

## 2020-12-26 DIAGNOSIS — M549 Dorsalgia, unspecified: Secondary | ICD-10-CM

## 2020-12-26 DIAGNOSIS — G8929 Other chronic pain: Secondary | ICD-10-CM

## 2020-12-26 DIAGNOSIS — E039 Hypothyroidism, unspecified: Secondary | ICD-10-CM

## 2020-12-26 DIAGNOSIS — M5104 Intervertebral disc disorders with myelopathy, thoracic region: Secondary | ICD-10-CM

## 2020-12-26 DIAGNOSIS — F418 Other specified anxiety disorders: Secondary | ICD-10-CM

## 2020-12-26 DIAGNOSIS — E785 Hyperlipidemia, unspecified: Secondary | ICD-10-CM

## 2020-12-26 DIAGNOSIS — R41840 Attention and concentration deficit: Secondary | ICD-10-CM

## 2020-12-26 NOTE — Assessment & Plan Note (Signed)
Encouraged DASH or MIND diet, decrease po intake and increase exercise as tolerated. Needs 7-8 hours of sleep nightly. Avoid trans fats, eat small, frequent meals every 4-5 hours with lean proteins, complex carbs and healthy fats. Minimize simple carbs, high fat foods and processed foods. He has been given permission by his pain management doctor to try exercising and working out again

## 2020-12-26 NOTE — Assessment & Plan Note (Signed)
Using Adderall prn and that has been helpful for days he has to concentrate.

## 2020-12-26 NOTE — Assessment & Plan Note (Signed)
Started Duloxetine for pain but it is helping his mood. Was anxious when he ran out so he is back on

## 2020-12-26 NOTE — Progress Notes (Signed)
MyChart Video Visit    Virtual Visit via Video Note   This visit type was conducted due to national recommendations for restrictions regarding the COVID-19 Pandemic (e.g. social distancing) in an effort to limit this patient's exposure and mitigate transmission in our community. This patient is at least at moderate risk for complications without adequate follow up. This format is felt to be most appropriate for this patient at this time. Physical exam was limited by quality of the video and audio technology used for the visit. S Chism, CMA was able to get the patient set up on a video visit.  Patient location: home Patient and provider in visit Provider location: Office  I discussed the limitations of evaluation and management by telemedicine and the availability of in person appointments. The patient expressed understanding and agreed to proceed.  Visit Date: 12/26/2020  Today's healthcare provider: Penni Homans, MD     Subjective:    Patient ID: Ronald Griffin, male    DOB: 1965/07/06, 55 y.o.   MRN: [id number]  Chief Complaint  Patient presents with   Follow-up    Medication    HPI  Patient is in today for a virtual visit for a follow up on medication management. He reports that Cymbalta 20 mg does not relieve his pain, but it does make the pain more manageable. He states it makes him feel better and is in a better mood. He reports that taking Adderall 20 mg once a day/prn has been working out for him and he states that he is less agitated while on it. He states that he is no experiencing any adverse side effects from Cymbalta or Adderall. Denies CP/palp/SOB/HA/congestion/fevers/GI or GU c/o. Taking meds as prescribed.  He reports that his thoracic back pain radiates to the lower left side of back. He states that he also feels a tingling sensation down his feet. He states that when he sits still, the tingling worsens and as a result he wiggles his feet which improves the  tingling sensation.    Past Medical History:  Diagnosis Date   Dizziness    Elevated antinuclear antibody (ANA) level 11/21/2014   Erectile dysfunction 11/21/2014   Frequent headaches    Hand pain, right 02/16/2017   Headache 03/29/2014   History of colonic polyps 11/21/2014   Hyperlipidemia    Knee pain, right 11/21/2014   Low back pain 04/03/2015   Motion sickness 11/21/2014   Myelopathy ([place])    thoracic   Obesity 02/16/2017   Post-operative nausea and vomiting    Preventative health care 11/21/2014    Past Surgical History:  Procedure Laterality Date   COLONOSCOPY  2012   in [place] DISCECTOMY Left 02/09/2018   Procedure: Microdiscectomy - Left - Thoracic Seven-Thoracid Eight - Thoracic Eight - Thoracic Nine;  Surgeon: Earnie Larsson, MD;  Location: [facility];  Service: Neurosurgery;  Laterality: Left;  Microdiscectomy - Left - Thoracic Seven-Thoracid Eight - Thoracic Eight - Thoracic Nine   VARICOCELE EXCISION     VASECTOMY      Family History  Problem Relation Age of Onset   Lung cancer Mother    Breast cancer Mother    Cancer Mother        breast age 33s, colon late 17s, lung, liver, skin   Colon cancer Mother 69   Bipolar disorder Father    Cancer Father 27       colon cancer  Dementia Father    Colon cancer Father 72   Thyroid disease Daughter    Cancer Maternal Grandmother        ?   Heart disease Maternal Grandfather        MI, sudden   Stroke Paternal Grandmother 46   Bipolar disorder Paternal Grandfather    Dementia Paternal Grandfather    Cancer Paternal Grandfather        colon   Colon cancer Paternal Grandfather    Obesity Brother    Colon cancer Cousin 58   Lung cancer Paternal Uncle    Esophageal cancer Neg Hx    Stomach cancer Neg Hx     Social History   Socioeconomic History   Marital status: Married    Spouse name: [name]   Number of children: 2   Years of education: college   Highest education level: Not on file   Occupational History   Not on file  Tobacco Use   Smoking status: Never   Smokeless tobacco: Never  Vaping Use   Vaping Use: Never used  Substance and Sexual Activity   Alcohol use: Yes    Alcohol/week: 3.0 - 5.0 standard drinks    Types: 3 - 5 Standard drinks or equivalent per week    Comment: weekly 3-5 times per pt   Drug use: No   Sexual activity: Yes    Comment: lives with wife, works in [employer], part time at [employer], no dietary restrictions, exercises regularly  Other Topics Concern   Not on file  Social History Narrative   Patient works full time for [employer]. Center. Patient lives at home with his wife [name]).   Education college.   Right handed.   Caffeine 4-6 cups daily.    Social Determinants of Health   Financial Resource Strain: Not on file  Food Insecurity: Not on file  Transportation Needs: Not on file  Physical Activity: Not on file  Stress: Not on file  Social Connections: Not on file  Intimate Partner Violence: Not on file    Outpatient Medications Prior to Visit  Medication Sig Dispense Refill   amphetamine-dextroamphetamine (ADDERALL XR) 20 MG 24 hr capsule Take 1 capsule (20 mg total) by mouth every morning. June 2022 30 capsule 0   azelaic acid (AZELEX) 20 % cream Apply topically 2 (two) times daily. After skin is thoroughly washed and patted dry, gently but thoroughly massage a thin film of azelaic acid cream into the affected area twice daily, in the morning and evening. 30 g 0   DULoxetine (CYMBALTA) 20 MG capsule Take 1 capsule (20 mg total) by mouth daily. 30 capsule 1   levothyroxine (SYNTHROID) 25 MCG tablet Take 1 tablet (25 mcg total) by mouth daily before breakfast. 90 tablet 1   minocycline (DYNACIN) 50 MG tablet Take 1 tablet (50 mg total) by mouth 2 (two) times daily. 30 tablet 3   sildenafil (VIAGRA) 25 MG tablet Take 1 tablet (25 mg total) by mouth daily as needed for erectile dysfunction. 10 tablet 0    tiZANidine (ZANAFLEX) 4 MG tablet Take 0.5-1 tablets (2-4 mg total) by mouth at bedtime as needed for muscle spasms. 30 tablet 0   No facility-administered medications prior to visit.    No Known Allergies  Review of Systems  Constitutional:  Negative for chills, fever and malaise/fatigue.  HENT:  Negative for congestion, sinus pain and sore throat.   Eyes:  Negative for blurred vision.  Respiratory:  Negative  for cough and shortness of breath.   Cardiovascular:  Negative for chest pain, palpitations and leg swelling.  Gastrointestinal:  Negative for blood in stool, diarrhea, nausea and vomiting.  Genitourinary:  Negative for flank pain and frequency.  Musculoskeletal:  Positive for back pain.  Skin:  Negative for rash.  Neurological:  Positive for tingling. Negative for headaches.      Objective:    Physical Exam Constitutional:      Appearance: Normal appearance.  HENT:     Head: Normocephalic and atraumatic.     Right Ear: External ear normal.     Left Ear: External ear normal.  Pulmonary:     Effort: Pulmonary effort is normal.  Musculoskeletal:     Cervical back: No rigidity.  Neurological:     Mental Status: He is alert and oriented to person, place, and time.  Psychiatric:        Behavior: Behavior normal.    There were no vitals taken for this visit. Wt Readings from Last 3 Encounters:  04/18/20 222 lb (100.7 kg)  04/03/20 222 lb 9.6 oz (101 kg)  02/17/20 222 lb (100.7 kg)    Diabetic Foot Exam - Simple   No data filed    Lab Results  Component Value Date   WBC 4.6 04/03/2020   HGB 14.7 04/03/2020   HCT 43.2 04/03/2020   PLT 202.0 04/03/2020   GLUCOSE 96 07/13/2020   CHOL 244 (H) 07/13/2020   TRIG 95.0 07/13/2020   HDL 65.70 07/13/2020   LDLDIRECT 140.0 02/14/2017   LDLCALC 160 (H) 07/13/2020   ALT 20 07/13/2020   AST 19 07/13/2020   NA 138 07/13/2020   K 4.4 07/13/2020   CL 102 07/13/2020   CREATININE 0.86 07/13/2020   BUN 19 07/13/2020    CO2 28 07/13/2020   TSH 4.18 07/13/2020   PSA 0.61 11/09/2019   HGBA1C 5.6 04/03/2020    Lab Results  Component Value Date   TSH 4.18 07/13/2020   Lab Results  Component Value Date   WBC 4.6 04/03/2020   HGB 14.7 04/03/2020   HCT 43.2 04/03/2020   MCV 91.7 04/03/2020   PLT 202.0 04/03/2020   Lab Results  Component Value Date   NA 138 07/13/2020   K 4.4 07/13/2020   CO2 28 07/13/2020   GLUCOSE 96 07/13/2020   BUN 19 07/13/2020   CREATININE 0.86 07/13/2020   BILITOT 0.9 07/13/2020   ALKPHOS 67 07/13/2020   AST 19 07/13/2020   ALT 20 07/13/2020   PROT 7.0 07/13/2020   ALBUMIN 4.3 07/13/2020   CALCIUM 9.2 07/13/2020   ANIONGAP 7 02/09/2018   GFR 96.81 07/13/2020   Lab Results  Component Value Date   CHOL 244 (H) 07/13/2020   Lab Results  Component Value Date   HDL 65.70 07/13/2020   Lab Results  Component Value Date   LDLCALC 160 (H) 07/13/2020   Lab Results  Component Value Date   TRIG 95.0 07/13/2020   Lab Results  Component Value Date   CHOLHDL 4 07/13/2020   Lab Results  Component Value Date   HGBA1C 5.6 04/03/2020       Assessment & Plan:   Problem List Items Addressed This Visit     Hyperlipidemia    Encourage heart healthy diet such as MIND or DASH diet, increase exercise, avoid trans fats, simple carbohydrates and processed foods, consider a krill or fish or flaxseed oil cap daily.       Relevant Orders  Comprehensive metabolic panel   Lipid panel   Back pain    Thoracic pain is worst but also with lumbar back pain as well. Notes radiculopathy on left>right. Notes tingling and need to move in his feet. If he sits still he gets achy. Continue to follow with pain management and use the Cymbalta.       Obesity    Encouraged DASH or MIND diet, decrease po intake and increase exercise as tolerated. Needs 7-8 hours of sleep nightly. Avoid trans fats, eat small, frequent meals every 4-5 hours with lean proteins, complex carbs and healthy  fats. Minimize simple carbs, high fat foods and processed foods. 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 He is receiving from pain management      Relevant Orders   CBC   Attention and concentration deficit    Using Adderall prn and that has been helpful for days he has to concentrate.       Hypothyroid    On Levothyroxine, continue to monitor      Relevant Orders   TSH   T4, free   Depression with anxiety    Started Duloxetine for pain but it is helping his mood. Was anxious when he ran out so he is back on        No orders of the defined types were placed in this encounter.   I discussed the assessment and treatment plan with the patient. The patient was provided an opportunity to ask questions and all were answered. The patient agreed with the plan and demonstrated an understanding of the instructions.   The patient was advised to call back or seek an in-person evaluation if the symptoms worsen or if the condition fails to improve as anticipated.  I provided 20 minutes of face-to-face time during this encounter.   Penni Homans, MD [facility] at [facility] [phone] (phone) [phone] (fax)  [place], Suezanne Jacquet, acting as a scribe for Penni Homans, MD, have documented all relevent documentation on behalf of Penni Homans, MD, as directed by Penni Homans, MD while in the presence of Penni Homans, MD.   I, Mosie Lukes, MD personally performed the services described in this documentation. All medical record entries made by the scribe were at my direction and in my presence. I have reviewed the chart and agree that the record reflects my personal performance and is accurate and complete

## 2020-12-26 NOTE — Assessment & Plan Note (Signed)
Encourage heart healthy diet such as MIND or DASH diet, increase exercise, avoid trans fats, simple carbohydrates and processed foods, consider a krill or fish or flaxseed oil cap daily.  °

## 2020-12-26 NOTE — Assessment & Plan Note (Signed)
Thoracic pain is worst but also with lumbar back pain as well. Notes radiculopathy on left>right. Notes tingling and need to move in his feet. If he sits still he gets achy. Continue to follow with pain management and use the Cymbalta.

## 2020-12-26 NOTE — Assessment & Plan Note (Signed)
On Levothyroxine, continue to monitor 

## 2020-12-26 NOTE — Assessment & Plan Note (Signed)
Has noted Duloxetine has helped the pain be more manageable. He is receiving from pain management

## 2021-01-25 ENCOUNTER — Other Ambulatory Visit: Payer: Self-pay | Admitting: Physical Medicine and Rehabilitation

## 2021-01-25 NOTE — Telephone Encounter (Signed)
Dr Ranell Patrick note was reviewed.  Cymbalta ordered.

## 2021-02-03 ENCOUNTER — Other Ambulatory Visit: Payer: Self-pay

## 2021-02-03 ENCOUNTER — Other Ambulatory Visit: Payer: Self-pay | Admitting: Family Medicine

## 2021-02-05 ENCOUNTER — Other Ambulatory Visit: Payer: Self-pay | Admitting: Family Medicine

## 2021-02-05 MED ORDER — AMPHETAMINE-DEXTROAMPHET ER 20 MG PO CP24: 20.0000 mg | ORAL_CAPSULE | ORAL | 0 refills | Status: DC

## 2021-02-05 NOTE — Telephone Encounter (Signed)
Requesting: Adderall XR 20mg   Contract: 04/03/2020 UDS: None Last Visit: 12/26/2020 Next Visit: 06/04/2021 Last Refill: 12/22/2020 #30 and 0RF  Please Advise

## 2021-02-05 NOTE — Telephone Encounter (Signed)
Duplicate request.  Please cancel this request.

## 2021-02-09 ENCOUNTER — Encounter: Payer: 59 | Attending: Physical Medicine and Rehabilitation | Admitting: Physical Medicine and Rehabilitation

## 2021-02-09 ENCOUNTER — Encounter: Payer: Self-pay | Admitting: Physical Medicine and Rehabilitation

## 2021-02-09 ENCOUNTER — Other Ambulatory Visit: Payer: Self-pay

## 2021-02-09 VITALS — BP 124/86 | HR 71 | Temp 98.8°F | Ht 70.5 in | Wt 238.2 lb

## 2021-02-09 DIAGNOSIS — G4701 Insomnia due to medical condition: Secondary | ICD-10-CM | POA: Insufficient documentation

## 2021-02-09 DIAGNOSIS — M48062 Spinal stenosis, lumbar region with neurogenic claudication: Secondary | ICD-10-CM | POA: Insufficient documentation

## 2021-02-09 NOTE — Addendum Note (Signed)
Addended by: Izora Ribas on: 02/09/2021 01:56 PM   Modules accepted: Level of Service

## 2021-02-09 NOTE — Progress Notes (Addendum)
Subjective:    Patient ID: Ronald Griffin, male    DOB: [birth year], 55 y.o.   MRN: [id number]  HPI  Ronald Griffin is a 55 year old man who presents for follow-up of lower back pain and sciatica.  1) Lumbar spinal stenosis: He had a fall in 2019 and underwent surgery by Dr. Trenton Gammon. He has since had back pain and neuropathy. He has done physical therapy. He uses a hot tub with a swimming trainer. MRI lumbar spine reviewed and shows the following: No new abnormality since the prior examination. Mild multilevel degenerative disc disease results in very mild central canal stenosis and mild left foraminal narrowing at L2-3.  -He has tried Gabapentin. He went up to 1100mg  and did not feel any benefit or side effects.   -Average pain is 7/10. His pain is constant, sharp, burning, tingling, and aching.   -He had benefit from Cymbalta. He stopped taking it for a day and felt nausea and feeling off after day 5 and 6. He was off it for 10 days. When he restarted the Cymbalta then he fell better.   -he would be interested in trying higher dose of 40mg .   -He has been trying to make positive lifestyle changes as well.   -He is trying to change his diet, decrease red meat to 3-4 times per week. He has been using Dr. Antionette Char shakes.   -He has been minimizing use of milk. He has been using organic.   -He tries to minimize sugar.   -he is currently applying for disability  -pain still greatly affects his quality of life.   -he used to work in [employer].   2) Insomnia: Last visit I prescribed Amitriptyline 10mg . It helped a lot for his sleep, but he had brain fog in the morning. He felt groggy in the morning so has only used it a couple of times.  -cymbalta helps him sleep  Pain Inventory Average Pain 8 Pain Right Now 8 My pain is constant, sharp, burning, tingling, and aching  In the last 24 hours, has pain interfered with the following? General activity 8 Relation with others  6 Enjoyment of life 8 What TIME of day is your pain at its worst? morning , daytime, evening, and night Sleep (in general) Fair  Pain is worse with: walking, bending, sitting, inactivity, standing, and some activites Pain improves with: medication and nothing Relief from Meds: 2    Family History  Problem Relation Age of Onset   Lung cancer Mother    Breast cancer Mother    Cancer Mother        breast age 73s, colon late 38s, lung, liver, skin   Colon cancer Mother 75   Bipolar disorder Father    Cancer Father 28       colon cancer   Dementia Father    Colon cancer Father 20   Thyroid disease Daughter    Cancer Maternal Grandmother        ?   Heart disease Maternal Grandfather        MI, sudden   Stroke Paternal Grandmother 65   Bipolar disorder Paternal Grandfather    Dementia Paternal Grandfather    Cancer Paternal Grandfather        colon   Colon cancer Paternal Grandfather    Obesity Brother    Colon cancer Cousin 41   Lung cancer Paternal Uncle    Esophageal cancer Neg Hx    Stomach cancer Neg  Hx    Social History   Socioeconomic History   Marital status: Married    Spouse name: Ronald Griffin   Number of children: 2   Years of education: college   Highest education level: Not on file  Occupational History   Not on file  Tobacco Use   Smoking status: Never   Smokeless tobacco: Never  Vaping Use   Vaping Use: Never used  Substance and Sexual Activity   Alcohol use: Yes    Alcohol/week: 3.0 - 5.0 standard drinks    Types: 3 - 5 Standard drinks or equivalent per week    Comment: weekly 3-5 times per pt   Drug use: No   Sexual activity: Yes    Comment: lives with wife, works in [employer], part time at [employer], no dietary restrictions, exercises regularly  Other Topics Concern   Not on file  Social History Narrative   Patient works full time for [employer]. [employer]. Patient lives at home with his wife Ronald Griffin).   Education college.    Right handed.   Caffeine 4-6 cups daily.    Social Determinants of Health   Financial Resource Strain: Not on file  Food Insecurity: Not on file  Transportation Needs: Not on file  Physical Activity: Not on file  Stress: Not on file  Social Connections: Not on file   Past Surgical History:  Procedure Laterality Date   COLONOSCOPY  2012   in [place] DISCECTOMY Left 02/09/2018   Procedure: Microdiscectomy - Left - Thoracic Seven-Thoracid Eight - Thoracic Eight - Thoracic Nine;  Surgeon: Earnie Larsson, MD;  Location: [place];  Service: Neurosurgery;  Laterality: Left;  Microdiscectomy - Left - Thoracic Seven-Thoracid Eight - Thoracic Eight - Thoracic Nine   VARICOCELE EXCISION     VASECTOMY     Past Medical History:  Diagnosis Date   Dizziness    Elevated antinuclear antibody (ANA) level 11/21/2014   Erectile dysfunction 11/21/2014   Frequent headaches    Hand pain, right 02/16/2017   Headache 03/29/2014   History of colonic polyps 11/21/2014   Hyperlipidemia    Knee pain, right 11/21/2014   Low back pain 04/03/2015   Motion sickness 11/21/2014   Myelopathy ([facility])    thoracic   Obesity 02/16/2017   Post-operative nausea and vomiting    Preventative health care 11/21/2014   BP 124/86   Pulse 71   Temp 98.8 F (37.1 C)   Ht 5' 10.5" (1.791 m)   Wt 238 lb 3.2 oz (108 kg)   SpO2 96%   BMI 33.70 kg/m   Opioid Risk Score:   Fall Risk Score:  `1  Depression screen PHQ 2/9  Depression screen [facility] 2/9 02/09/2021 04/18/2020 01/07/2020 12/23/2016  Decreased Interest 0 1 0 0  Down, Depressed, Hopeless 0 1 0 0  PHQ - 2 Score 0 2 0 0  Altered sleeping - 3 0 -  Tired, decreased energy - 2 0 -  Change in appetite - 1 0 -  Feeling bad or failure about yourself  - 1 0 -  Trouble concentrating - 3 0 -  Moving slowly or fidgety/restless - 0 0 -  Suicidal thoughts - 0 0 -  PHQ-9 Score - 12 0 -  Difficult doing work/chores - Somewhat difficult - -    Review of  Systems  Constitutional: Negative.   HENT: Negative.    Eyes: Negative.   Respiratory: Negative.  Cardiovascular: Negative.   Gastrointestinal: Negative.   Endocrine: Negative.   Genitourinary: Negative.   Musculoskeletal:  Positive for back pain.  Skin: Negative.   Allergic/Immunologic: Negative.   Neurological: Negative.   Hematological: Negative.   Psychiatric/Behavioral: Negative.    All other systems reviewed and are negative.     Objective:   Physical Exam Gen: no distress, normal appearing HEENT: oral mucosa pink and moist, NCAT Cardio: Reg rate Chest: normal effort, normal rate of breathing Abd: soft, non-distended Ext: no edema Psych: pleasant, normal affect Skin: intact Neuro: Alert  Musculoskeletal: Tenderness to palpation of lower back, worst with extension.     Assessment & Plan:  Ronald Griffin is a 55 year old man who presents for follow-up of the following conditions:  1) Chronic Pain Syndrome secondary to lumbar spinal stenosis, right>left.  -Discussed current symptoms of pain and history of pain.  -MRI reviewed with patient, from 10/21.  -Increase Cymbalta to 40mg  HS daily. Discussed the possible side effect of nausea in the first couple of weeks.  -Discussed current symptoms of pain and history of pain.  -Discussed benefits of exercise in reducing pain. -He is trying to use a shake by Dr. Ardeth Perfect. -Can consider ESI in the future if medications and lifestyle changes are not helping enough.  -He has tried Keto and intermittent fasting. He went from a size 44 to 34 with this.  -Provided advice regarding avoiding meats that are given antibiotics.  -Encouraged exercise and discussed the benefits of increased blood Griffin, endorphins, general health, and pain relief.  -Discussed following foods that may reduce pain: 1) Ginger (especially studied for arthritis)- reduce leukotriene production to decrease inflammation 2) Blueberries- high in phytonutrients that  decrease inflammation 3) Salmon- marine omega-3s reduce joint swelling and pain 4) Pumpkin seeds- reduce inflammation 5) dark chocolate- reduces inflammation 6) turmeric- reduces inflammation 7) tart cherries - reduce pain and stiffness 8) extra virgin olive oil - its compound olecanthal helps to block prostaglandins  9) chili peppers- can be eaten or applied topically via capsaicin 10) mint- helpful for headache, muscle aches, joint pain, and itching 11) garlic- reduces inflammation  Link to further information on diet for chronic pain: http://www.randall.com/   -Discussed Qutenza as an option for neuropathic pain control. Discussed that this is a capsaicin patch, stronger than capsaicin cream. Discussed that it is currently approved for diabetic peripheral neuropathy and post-herpetic neuralgia, but that it has also shown benefit in treating other forms of neuropathy. Provided patient with link to site to learn more about the patch: CinemaBonus.fr. Discussed that the patch would be placed in office and benefits usually last 3 months. Discussed that unintended exposure to capsaicin can cause severe irritation of eyes, mucous membranes, respiratory tract, and skin, but that Qutenza is a local treatment and does not have the systemic side effects of other nerve medications. Discussed that there may be pain, itching, erythema, and decreased sensory function associated with the application of Qutenza. Side effects usually subside within 1 week. A cold pack of analgesic medications can help with these side effects. Blood pressure can also be increased due to pain associated with administration of the patch.   Turmeric to reduce inflammation--can be used in cooking or taken as a supplement.  Benefits of turmeric:  -Highly anti-inflammatory  -Increases antioxidants  -Improves memory, attention, brain disease  -Lowers  risk of heart disease  -May help prevent cancer  -Decreases pain  -Alleviates depression  -Delays aging and decreases risk of chronic  disease  -Consume with black pepper to increase absorption    Turmeric Milk Recipe:  1 cup milk  1 tsp turmeric  1 tsp cinnamon  1 tsp grated ginger (optional)  Black pepper (boosts the anti-inflammatory properties of turmeric).  1 tsp honey   2) Insomnia: -frequent awakenings at night due to pain -Amitriptyline HS 10mg  made him too sleepy.  -Try CBD at night.  -Try to go outside near sunrise -Get exercise during the day.  -Turn off all devices an hour before bedtime.  -Teas that can benefit: chamomile, valerian root, Brahmi (Bacopa) -Can consider over the counter melatonin or magnesium glycinate (latter can also help with constipation) -Pistachios naturally increase the production of melatonin   40 minutes spent in discussion of his pain, response to Cymbalta, trying a higher dose, CBD oil, anti-inflammatory foods, magnetic therapy

## 2021-02-09 NOTE — Patient Instructions (Addendum)
Foods that may reduce pain: 1) Ginger (especially studied for arthritis)- reduce leukotriene production to decrease inflammation 2) Blueberries- high in phytonutrients that decrease inflammation 3) Salmon- marine omega-3s reduce joint swelling and pain 4) Pumpkin seeds- reduce inflammation 5) dark chocolate- reduces inflammation 6) turmeric- reduces inflammation 7) tart cherries - reduce pain and stiffness 8) extra virgin olive oil - its compound olecanthal helps to block prostaglandins  9) chili peppers- can be eaten or applied topically via capsaicin 10) mint- helpful for headache, muscle aches, joint pain, and itching 11) garlic- reduces inflammation  Link to further information on diet for chronic pain: [email or web address].[email or web address]   Sleep: -Try to go outside near sunrise -Get exercise during the day.  -Turn off all devices an hour before bedtime.  -Teas that can benefit: chamomile, valerian root, Brahmi (Bacopa) -Can consider over the counter melatonin or magnesium glycinate (latter can also help with constipation) -Pistachios naturally increase the production of melatonin

## 2021-03-02 ENCOUNTER — Other Ambulatory Visit: Payer: Self-pay | Admitting: Physical Medicine and Rehabilitation

## 2021-03-02 MED ORDER — DULOXETINE HCL 20 MG PO CPEP: 40.0000 mg | ORAL_CAPSULE | Freq: Every day | ORAL | 0 refills | Status: DC

## 2021-03-09 ENCOUNTER — Ambulatory Visit: Payer: 59 | Admitting: Physical Medicine and Rehabilitation

## 2021-03-09 ENCOUNTER — Other Ambulatory Visit: Payer: Self-pay | Admitting: Family Medicine

## 2021-03-09 MED ORDER — AMPHETAMINE-DEXTROAMPHET ER 20 MG PO CP24: 20.0000 mg | ORAL_CAPSULE | ORAL | 0 refills | Status: DC

## 2021-03-09 NOTE — Telephone Encounter (Signed)
Requesting: Adderall XR 20mg  Contract: 04/03/2020 UDS: None Last Visit: 12/26/2020 Next Visit: 06/04/2021 Last Refill: 02/05/21, #30 x 0RF  Please Advise

## 2021-03-25 ENCOUNTER — Other Ambulatory Visit: Payer: Self-pay | Admitting: Physical Medicine and Rehabilitation

## 2021-03-26 MED ORDER — DULOXETINE HCL 20 MG PO CPEP: 40.0000 mg | ORAL_CAPSULE | Freq: Every day | ORAL | 2 refills | Status: DC

## 2021-04-06 ENCOUNTER — Other Ambulatory Visit: Payer: Self-pay | Admitting: Family Medicine

## 2021-04-06 MED ORDER — AMPHETAMINE-DEXTROAMPHET ER 20 MG PO CP24: 20.0000 mg | ORAL_CAPSULE | ORAL | 0 refills | Status: DC

## 2021-04-06 NOTE — Telephone Encounter (Signed)
Supplement and monitor 

## 2021-04-06 NOTE — Telephone Encounter (Signed)
Requesting: Adderall XR 20mg   Contract: 04/03/2020 UDS: None Last Visit: 12/26/2020 Next Visit: 06/04/2021 Last Refill: 03/09/2021 #30 and 0RF  Please Advise

## 2021-05-13 ENCOUNTER — Other Ambulatory Visit: Payer: Self-pay | Admitting: Family Medicine

## 2021-05-14 MED ORDER — SILDENAFIL CITRATE 25 MG PO TABS: 25.0000 mg | ORAL_TABLET | Freq: Every day | ORAL | 0 refills | Status: DC | PRN

## 2021-05-14 NOTE — Telephone Encounter (Signed)
Okay to fill. Was last seen on 12/26/20 and  f/u is on 10/09/21

## 2021-05-16 ENCOUNTER — Other Ambulatory Visit: Payer: Self-pay | Admitting: Family Medicine

## 2021-05-17 ENCOUNTER — Other Ambulatory Visit: Payer: Self-pay | Admitting: Family Medicine

## 2021-05-17 MED ORDER — AMPHETAMINE-DEXTROAMPHET ER 20 MG PO CP24: 20.0000 mg | ORAL_CAPSULE | ORAL | 0 refills | Status: DC

## 2021-05-17 NOTE — Telephone Encounter (Signed)
Requesting: Adderall XR 20mg  Contract: none UDS: none Last Visit: 12/26/20 Next Visit: 10/09/21 Last Refill: 04/06/21  Please Advise

## 2021-06-04 ENCOUNTER — Encounter: Payer: 59 | Admitting: Family Medicine

## 2021-06-07 ENCOUNTER — Encounter: Payer: Self-pay | Admitting: Physical Medicine and Rehabilitation

## 2021-06-07 ENCOUNTER — Other Ambulatory Visit: Payer: Self-pay

## 2021-06-07 ENCOUNTER — Encounter: Payer: Medicare HMO | Attending: Physical Medicine and Rehabilitation | Admitting: Physical Medicine and Rehabilitation

## 2021-06-07 VITALS — BP 130/90 | HR 66 | Temp 98.7°F | Ht 70.5 in | Wt 240.0 lb

## 2021-06-07 DIAGNOSIS — M48062 Spinal stenosis, lumbar region with neurogenic claudication: Secondary | ICD-10-CM | POA: Diagnosis not present

## 2021-06-07 DIAGNOSIS — G4701 Insomnia due to medical condition: Secondary | ICD-10-CM | POA: Insufficient documentation

## 2021-06-07 NOTE — Progress Notes (Signed)
Subjective:    Patient ID: Ronald Griffin, male    DOB: [birth year], 56 y.o.   MRN: [id number]  HPI  Ronald Griffin is a 56 year old man who presents for follow-up of lower back pain and sciatica.  1) Lumbar spinal stenosis: He had a fall in 2019 and underwent surgery by Dr. Trenton Gammon. He has since had back pain and neuropathy. He has done physical therapy. He uses a hot tub with a swimming trainer. MRI lumbar spine reviewed and shows the following: No new abnormality since the prior examination. Mild multilevel degenerative disc disease results in very mild central canal stenosis and mild left foraminal narrowing at L2-3.  -He has tried Gabapentin. He went up to 1100mg  and did not feel any benefit or side effects.   -Average pain is 7/10. His pain is constant, sharp, burning, tingling, and aching.   -He had benefit from Cymbalta. He stopped taking it for a day and felt nausea and feeling off after day 5 and 6. He was off it for 10 days. When he restarted the Cymbalta then he fell better. The Cymblata continues to help him to be able to deal with the pain  -He prefers natural options  -He wishes the agonizing nerve pain would stop  -he would be interested in trying higher dose of 40mg .   -He has been trying to make positive lifestyle changes as well.   -He is trying to change his diet, decrease red meat to 3-4 times per week. He has been using Dr. Antionette Char shakes.   -He has been minimizing use of milk. He has been using organic.   -He tries to minimize sugar.   -He is excited to try the Qutenza today  -His pain is in his lower back and buttock.  -he is currently applying for disability  -pain still greatly affects his quality of life.   -he used to work in [employer].   2) Insomnia: Last visit I prescribed Amitriptyline 10mg . It helped a lot for his sleep, but he had brain fog in the morning. He felt groggy in the morning so has only used it a couple of times.  -cymbalta  helps him sleep -he sometimes feels restless leg syndrome at night.   Pain Inventory Average Pain 8 Pain Right Now 8 My pain is constant, sharp, burning, tingling, and aching  In the last 24 hours, has pain interfered with the following? General activity 8 Relation with others 6 Enjoyment of life 8 What TIME of day is your pain at its worst? morning , daytime, evening, and night Sleep (in general) Fair  Pain is worse with: walking, bending, sitting, inactivity, standing, and some activites Pain improves with: medication and nothing Relief from Meds: 2    Family History  Problem Relation Age of Onset   Lung cancer Mother    Breast cancer Mother    Cancer Mother        breast age 48s, colon late 33s, lung, liver, skin   Colon cancer Mother 26   Bipolar disorder Father    Cancer Father 49       colon cancer   Dementia Father    Colon cancer Father 43   Thyroid disease Daughter    Cancer Maternal Grandmother        ?   Heart disease Maternal Grandfather        MI, sudden   Stroke Paternal Grandmother 61   Bipolar disorder Paternal Grandfather  Dementia Paternal Grandfather    Cancer Paternal Grandfather        colon   Colon cancer Paternal Grandfather    Obesity Brother    Colon cancer Cousin 94   Lung cancer Paternal Uncle    Esophageal cancer Neg Hx    Stomach cancer Neg Hx    Social History   Socioeconomic History   Marital status: Married    Spouse name: Ronald Griffin   Number of children: 2   Years of education: college   Highest education level: Not on file  Occupational History   Not on file  Tobacco Use   Smoking status: Never   Smokeless tobacco: Never  Vaping Use   Vaping Use: Never used  Substance and Sexual Activity   Alcohol use: Yes    Alcohol/week: 3.0 - 5.0 standard drinks    Types: 3 - 5 Standard drinks or equivalent per week    Comment: weekly 3-5 times per pt   Drug use: No   Sexual activity: Yes    Comment: lives with wife, works in  [employer], part time at [employer], no dietary restrictions, exercises regularly  Other Topics Concern   Not on file  Social History Narrative   Patient works full time for [employer]. Patient lives at home with his wife Ronald Griffin).   Education college.   Right handed.   Caffeine 4-6 cups daily.    Social Determinants of Health   Financial Resource Strain: Not on file  Food Insecurity: Not on file  Transportation Needs: Not on file  Physical Activity: Not on file  Stress: Not on file  Social Connections: Not on file   Past Surgical History:  Procedure Laterality Date   COLONOSCOPY  2012   in [place] DISCECTOMY Left 02/09/2018   Procedure: Microdiscectomy - Left - Thoracic Seven-Thoracid Eight - Thoracic Eight - Thoracic Nine;  Surgeon: Earnie Larsson, MD;  Location: [place];  Service: Neurosurgery;  Laterality: Left;  Microdiscectomy - Left - Thoracic Seven-Thoracid Eight - Thoracic Eight - Thoracic Nine   VARICOCELE EXCISION     VASECTOMY     Past Medical History:  Diagnosis Date   Dizziness    Elevated antinuclear antibody (ANA) level 11/21/2014   Erectile dysfunction 11/21/2014   Frequent headaches    Hand pain, right 02/16/2017   Headache 03/29/2014   History of colonic polyps 11/21/2014   Hyperlipidemia    Knee pain, right 11/21/2014   Low back pain 04/03/2015   Motion sickness 11/21/2014   Myelopathy (HCC)    thoracic   Obesity 02/16/2017   Post-operative nausea and vomiting    Preventative health care 11/21/2014   BP 130/90    Pulse 66    Temp 98.7 F (37.1 C)    Ht 5' 10.5" (1.791 m)    Wt 240 lb (108.9 kg)    SpO2 95%    BMI 33.95 kg/m   Opioid Risk Score:   Fall Risk Score:  `1  Depression screen PHQ 2/9  Depression screen [facility] 2/9 02/09/2021 04/18/2020 01/07/2020 12/23/2016  Decreased Interest 0 1 0 0  Down, Depressed, Hopeless 0 1 0 0  PHQ - 2 Score 0 2 0 0  Altered sleeping - 3 0 -  Tired, decreased energy - 2 0 -   Change in appetite - 1 0 -  Feeling bad or failure about yourself  - 1 0 -  Trouble concentrating -  3 0 -  Moving slowly or fidgety/restless - 0 0 -  Suicidal thoughts - 0 0 -  PHQ-9 Score - 12 0 -  Difficult doing work/chores - Somewhat difficult - -    Review of Systems  Constitutional: Negative.   HENT: Negative.    Eyes: Negative.   Respiratory: Negative.    Cardiovascular: Negative.   Gastrointestinal: Negative.   Endocrine: Negative.   Genitourinary: Negative.   Musculoskeletal:  Positive for back pain.  Skin: Negative.   Allergic/Immunologic: Negative.   Neurological: Negative.   Hematological: Negative.   Psychiatric/Behavioral: Negative.    All other systems reviewed and are negative.     Objective:   Physical Exam Gen: no distress, normal appearing HEENT: oral mucosa pink and moist, NCAT Cardio: Reg rate Chest: normal effort, normal rate of breathing Abd: soft, non-distended Ext: no edema Psych: pleasant, normal affect Skin: intact Musculoskeletal: Tenderness to palpation of lower back, worst with extension.     Assessment & Plan:  Mr. Mazzola is a 56 year old man who presents for follow-up of the following conditions:  1) Chronic Pain Syndrome secondary to lumbar spinal stenosis, right>left.  -Discussed current symptoms of pain and history of pain.  -MRI reviewed with patient, from 10/21.  -Increase Cymbalta to 40mg  HS daily. Discussed the possible side effect of nausea in the first couple of weeks.  -Discussed current symptoms of pain and history of pain.  -Discussed benefits of exercise in reducing pain. -He is trying to use a shake by Dr. Ardeth Perfect. -Discussed Qutenza as an option for neuropathic pain control. Discussed that this is a capsaicin patch, stronger than capsaicin cream. Discussed that it is currently approved for diabetic peripheral neuropathy and post-herpetic neuralgia, but that it has also shown benefit in treating other forms of  neuropathy. Provided patient with link to site to learn more about the patch: CinemaBonus.fr. Discussed that the patch would be placed in office and benefits usually last 3 months. Discussed that unintended exposure to capsaicin can cause severe irritation of eyes, mucous membranes, respiratory tract, and skin, but that Qutenza is a local treatment and does not have the systemic side effects of other nerve medications. Discussed that there may be pain, itching, erythema, and decreased sensory function associated with the application of Qutenza. Side effects usually subside within 1 week. A cold pack of analgesic medications can help with these side effects. Blood pressure can also be increased due to pain associated with administration of the patch.   2 patches of Qutenza was applied to the area of pain. Ice packs were applied during the procedure to ensure patient comfort. Blood pressure was monitored every 15 minutes. The patient tolerated the procedure well. Post-procedure instructions were given and follow-up has been scheduled.   -Can consider ESI in the future if medications and lifestyle changes are not helping enough.  -He has tried Keto and intermittent fasting. He went from a size 44 to 34 with this.  -Provided advice regarding avoiding meats that are given antibiotics.  -Encouraged exercise and discussed the benefits of increased blood Griffin, endorphins, general health, and pain relief.  -Discussed following foods that may reduce pain: 1) Ginger (especially studied for arthritis)- reduce leukotriene production to decrease inflammation 2) Blueberries- high in phytonutrients that decrease inflammation 3) Salmon- marine omega-3s reduce joint swelling and pain 4) Pumpkin seeds- reduce inflammation 5) dark chocolate- reduces inflammation 6) turmeric- reduces inflammation 7) tart cherries - reduce pain and stiffness 8) extra virgin olive oil -  its compound olecanthal helps to block  prostaglandins  9) chili peppers- can be eaten or applied topically via capsaicin 10) mint- helpful for headache, muscle aches, joint pain, and itching 11) garlic- reduces inflammation  Link to further information on diet for chronic pain: http://www.randall.com/   -Discussed Qutenza as an option for neuropathic pain control. Discussed that this is a capsaicin patch, stronger than capsaicin cream. Discussed that it is currently approved for diabetic peripheral neuropathy and post-herpetic neuralgia, but that it has also shown benefit in treating other forms of neuropathy. Provided patient with link to site to learn more about the patch: CinemaBonus.fr. Discussed that the patch would be placed in office and benefits usually last 3 months. Discussed that unintended exposure to capsaicin can cause severe irritation of eyes, mucous membranes, respiratory tract, and skin, but that Qutenza is a local treatment and does not have the systemic side effects of other nerve medications. Discussed that there may be pain, itching, erythema, and decreased sensory function associated with the application of Qutenza. Side effects usually subside within 1 week. A cold pack of analgesic medications can help with these side effects. Blood pressure can also be increased due to pain associated with administration of the patch.   -Provided with a pain relief journal and discussed that it contains foods and lifestyle tips to naturally help to improve pain. Discussed that these lifestyle strategies are also very good for health unlike some medications which can have negative side effects. Discussed that the act of keeping a journal can be therapeutic and helpful to realize patterns what helps to trigger and alleviate pain.    Turmeric to reduce inflammation--can be used in cooking or taken as a supplement.  Benefits of turmeric:  -Highly  anti-inflammatory  -Increases antioxidants  -Improves memory, attention, brain disease  -Lowers risk of heart disease  -May help prevent cancer  -Decreases pain  -Alleviates depression  -Delays aging and decreases risk of chronic disease  -Consume with black pepper to increase absorption    Turmeric Milk Recipe:  1 cup milk  1 tsp turmeric  1 tsp cinnamon  1 tsp grated ginger (optional)  Black pepper (boosts the anti-inflammatory properties of turmeric).  1 tsp honey   2) Insomnia: -frequent awakenings at night due to pain -Amitriptyline HS 10mg  made him too sleepy.  -Try CBD at night. Discussed its efficacy for him. Continue prn.  -Try to go outside near sunrise -Get exercise during the day.  -Turn off all devices an hour before bedtime.  -Teas that can benefit: chamomile, valerian root, Brahmi (Bacopa) -Can consider over the counter melatonin or magnesium glycinate (latter can also help with constipation) -Pistachios naturally increase the production of melatonin   >40 minutes spent in discussion of risks and benefits of Qutenza and obtaining informed consent, discussion of q90 day follow-up and expectation of improvement in pain with each repeat application, discussion of CBD to help him sleep

## 2021-07-06 ENCOUNTER — Encounter: Payer: Self-pay | Admitting: Family Medicine

## 2021-07-06 ENCOUNTER — Other Ambulatory Visit: Payer: Self-pay | Admitting: Family Medicine

## 2021-07-06 MED ORDER — LEVOTHYROXINE SODIUM 25 MCG PO TABS: 25.0000 ug | ORAL_TABLET | Freq: Every day | ORAL | 0 refills | Status: DC

## 2021-10-08 NOTE — Progress Notes (Unsigned)
Subjective:    Patient ID: Ronald Griffin, male    DOB: May 09, 1965, 56 y.o.   MRN: [id number]  No chief complaint on file.   HPI Patient is in today for his annual physical exam.  Past Medical History:  Diagnosis Date   Dizziness    Elevated antinuclear antibody (ANA) level 11/21/2014   Erectile dysfunction 11/21/2014   Frequent headaches    Hand pain, right 02/16/2017   Headache 03/29/2014   History of colonic polyps 11/21/2014   Hyperlipidemia    Knee pain, right 11/21/2014   Low back pain 04/03/2015   Motion sickness 11/21/2014   Myelopathy ([place])    thoracic   Obesity 02/16/2017   Post-operative nausea and vomiting    Preventative health care 11/21/2014    Past Surgical History:  Procedure Laterality Date   COLONOSCOPY  2012   in [place] DISCECTOMY Left 02/09/2018   Procedure: Microdiscectomy - Left - Thoracic Seven-Thoracid Eight - Thoracic Eight - Thoracic Nine;  Surgeon: Earnie Larsson, MD;  Location: [place];  Service: Neurosurgery;  Laterality: Left;  Microdiscectomy - Left - Thoracic Seven-Thoracid Eight - Thoracic Eight - Thoracic Nine   VARICOCELE EXCISION     VASECTOMY      Family History  Problem Relation Age of Onset   Lung cancer Mother    Breast cancer Mother    Cancer Mother        breast age 48s, colon late 36s, lung, liver, skin   Colon cancer Mother 20   Bipolar disorder Father    Cancer Father 80       colon cancer   Dementia Father    Colon cancer Father 4   Thyroid disease Daughter    Cancer Maternal Grandmother        ?   Heart disease Maternal Grandfather        MI, sudden   Stroke Paternal Grandmother 73   Bipolar disorder Paternal Grandfather    Dementia Paternal Grandfather    Cancer Paternal Grandfather        colon   Colon cancer Paternal Grandfather    Obesity Brother    Colon cancer Cousin 73   Lung cancer Paternal Uncle    Esophageal cancer Neg Hx    Stomach cancer Neg Hx     Social History    Socioeconomic History   Marital status: Married    Spouse name: [name]   Number of children: 2   Years of education: college   Highest education level: Not on file  Occupational History   Not on file  Tobacco Use   Smoking status: Never   Smokeless tobacco: Never  Vaping Use   Vaping Use: Never used  Substance and Sexual Activity   Alcohol use: Yes    Alcohol/week: 3.0 - 5.0 standard drinks of alcohol    Types: 3 - 5 Standard drinks or equivalent per week    Comment: weekly 3-5 times per pt   Drug use: No   Sexual activity: Yes    Comment: lives with wife, works in [employer], part time at [employer], no dietary restrictions, exercises regularly  Other Topics Concern   Not on file  Social History Narrative   Patient works full time for [employer]. [employer]. Patient lives at home with his wife [name]).   Education college.   Right handed.   Caffeine 4-6 cups daily.    Social Determinants of Health  Financial Resource Strain: Not on file  Food Insecurity: Not on file  Transportation Needs: Not on file  Physical Activity: Not on file  Stress: Not on file  Social Connections: Not on file  Intimate Partner Violence: Not on file    Outpatient Medications Prior to Visit  Medication Sig Dispense Refill   amphetamine-dextroamphetamine (ADDERALL XR) 20 MG 24 hr capsule Take 1 capsule (20 mg total) by mouth every morning. January 2023 30 capsule 0   DULoxetine (CYMBALTA) 20 MG capsule Take 2 capsules (40 mg total) by mouth daily. 60 capsule 2   levothyroxine (SYNTHROID) 25 MCG tablet Take 1 tablet (25 mcg total) by mouth daily before breakfast. 30 tablet 0   sildenafil (VIAGRA) 25 MG tablet Take 1 tablet (25 mg total) by mouth daily as needed for erectile dysfunction. 10 tablet 0   tiZANidine (ZANAFLEX) 4 MG capsule Take by mouth.     No facility-administered medications prior to visit.    No Known Allergies  ROS     Objective:    Physical  Exam  There were no vitals taken for this visit. Wt Readings from Last 3 Encounters:  06/07/21 240 lb (108.9 kg)  02/09/21 238 lb 3.2 oz (108 kg)  04/18/20 222 lb (100.7 kg)    Diabetic Foot Exam - Simple   No data filed    Lab Results  Component Value Date   WBC 4.6 04/03/2020   HGB 14.7 04/03/2020   HCT 43.2 04/03/2020   PLT 202.0 04/03/2020   GLUCOSE 96 07/13/2020   CHOL 244 (H) 07/13/2020   TRIG 95.0 07/13/2020   HDL 65.70 07/13/2020   LDLDIRECT 140.0 02/14/2017   LDLCALC 160 (H) 07/13/2020   ALT 20 07/13/2020   AST 19 07/13/2020   NA 138 07/13/2020   K 4.4 07/13/2020   CL 102 07/13/2020   CREATININE 0.86 07/13/2020   BUN 19 07/13/2020   CO2 28 07/13/2020   TSH 4.18 07/13/2020   PSA 0.61 11/09/2019   HGBA1C 5.6 04/03/2020    Lab Results  Component Value Date   TSH 4.18 07/13/2020   Lab Results  Component Value Date   WBC 4.6 04/03/2020   HGB 14.7 04/03/2020   HCT 43.2 04/03/2020   MCV 91.7 04/03/2020   PLT 202.0 04/03/2020   Lab Results  Component Value Date   NA 138 07/13/2020   K 4.4 07/13/2020   CO2 28 07/13/2020   GLUCOSE 96 07/13/2020   BUN 19 07/13/2020   CREATININE 0.86 07/13/2020   BILITOT 0.9 07/13/2020   ALKPHOS 67 07/13/2020   AST 19 07/13/2020   ALT 20 07/13/2020   PROT 7.0 07/13/2020   ALBUMIN 4.3 07/13/2020   CALCIUM 9.2 07/13/2020   ANIONGAP 7 02/09/2018   GFR 96.81 07/13/2020   Lab Results  Component Value Date   CHOL 244 (H) 07/13/2020   Lab Results  Component Value Date   HDL 65.70 07/13/2020   Lab Results  Component Value Date   LDLCALC 160 (H) 07/13/2020   Lab Results  Component Value Date   TRIG 95.0 07/13/2020   Lab Results  Component Value Date   CHOLHDL 4 07/13/2020   Lab Results  Component Value Date   HGBA1C 5.6 04/03/2020       Assessment & Plan:   COLONOSCOPY: 09/15/17, ordered today PAP: n/a PSA:  11/09/19, ordered today DEXA: No record of DEXA scan   Problem List Items Addressed This  Visit   None   I  am having Pablo Ledger. Ferris maintain his DULoxetine, sildenafil, amphetamine-dextroamphetamine, tiZANidine, and levothyroxine.  No orders of the defined types were placed in this encounter.

## 2021-10-09 ENCOUNTER — Ambulatory Visit (INDEPENDENT_AMBULATORY_CARE_PROVIDER_SITE_OTHER): Payer: Medicare HMO | Admitting: Family Medicine

## 2021-10-09 ENCOUNTER — Encounter: Payer: Self-pay | Admitting: Family Medicine

## 2021-10-09 VITALS — BP 132/80 | HR 64 | Resp 20 | Ht 70.5 in | Wt 243.0 lb

## 2021-10-09 DIAGNOSIS — R739 Hyperglycemia, unspecified: Secondary | ICD-10-CM | POA: Diagnosis not present

## 2021-10-09 DIAGNOSIS — R41840 Attention and concentration deficit: Secondary | ICD-10-CM | POA: Diagnosis not present

## 2021-10-09 DIAGNOSIS — Z0000 Encounter for general adult medical examination without abnormal findings: Secondary | ICD-10-CM

## 2021-10-09 DIAGNOSIS — E785 Hyperlipidemia, unspecified: Secondary | ICD-10-CM

## 2021-10-09 DIAGNOSIS — Z86010 Personal history of colonic polyps: Secondary | ICD-10-CM

## 2021-10-09 DIAGNOSIS — Z1211 Encounter for screening for malignant neoplasm of colon: Secondary | ICD-10-CM

## 2021-10-09 DIAGNOSIS — Z125 Encounter for screening for malignant neoplasm of prostate: Secondary | ICD-10-CM

## 2021-10-09 DIAGNOSIS — M549 Dorsalgia, unspecified: Secondary | ICD-10-CM

## 2021-10-09 DIAGNOSIS — Z79899 Other long term (current) drug therapy: Secondary | ICD-10-CM

## 2021-10-09 DIAGNOSIS — E039 Hypothyroidism, unspecified: Secondary | ICD-10-CM

## 2021-10-09 DIAGNOSIS — E669 Obesity, unspecified: Secondary | ICD-10-CM | POA: Diagnosis not present

## 2021-10-09 DIAGNOSIS — G8929 Other chronic pain: Secondary | ICD-10-CM

## 2021-10-09 DIAGNOSIS — R69 Illness, unspecified: Secondary | ICD-10-CM | POA: Diagnosis not present

## 2021-10-09 DIAGNOSIS — F418 Other specified anxiety disorders: Secondary | ICD-10-CM

## 2021-10-09 NOTE — Assessment & Plan Note (Signed)
Encouraged DASH or MIND diet, decrease po intake and increase exercise as tolerated. Needs 7-8 hours of sleep nightly. Avoid trans fats, eat small, frequent meals every 4-5 hours with lean proteins, complex carbs and healthy fats. Minimize simple carbs, high fat foods and processed foods 

## 2021-10-09 NOTE — Assessment & Plan Note (Signed)
Encourage heart healthy diet such as MIND or DASH diet, increase exercise, avoid trans fats, simple carbohydrates and processed foods, consider a krill or fish or flaxseed oil cap daily.  °

## 2021-10-09 NOTE — Assessment & Plan Note (Signed)
Has tried numerous modalities but has not any relief. Eats more, sleeps less and mood drops. Encouraged moist heat and gentle stretching as tolerated. May try NSAIDs and prescription meds as directed and report if symptoms worsen or seek immediate care

## 2021-10-09 NOTE — Assessment & Plan Note (Addendum)
On Levothyroxine, continue to monitor. Patient acknowledges he has not been taking 

## 2021-10-09 NOTE — Patient Instructions (Addendum)
2 meds for weight loss Ronald Griffin is weekly and Saxenda is daily, let us know if you would like to start it.    Preventive Care 36-58 Years Old, Male Preventive care refers to lifestyle choices and visits with your health care provider that can promote health and wellness. Preventive care visits are also called wellness exams. What can I expect for my preventive care visit? Counseling During your preventive care visit, your health care provider may ask about your: Medical history, including: Past medical problems. Family medical history. Current health, including: Emotional well-being. Home life and relationship well-being. Sexual activity. Lifestyle, including: Alcohol, nicotine or tobacco, and drug use. Access to firearms. Diet, exercise, and sleep habits. Safety issues such as seatbelt and bike helmet use. Sunscreen use. Work and work Statistician. Physical exam Your health care provider will check your: Height and weight. These may be used to calculate your BMI (body mass index). BMI is a measurement that tells if you are at a healthy weight. Waist circumference. This measures the distance around your waistline. This measurement also tells if you are at a healthy weight and may help predict your risk of certain diseases, such as type 2 diabetes and high blood pressure. Heart rate and blood pressure. Body temperature. Skin for abnormal spots. What immunizations do I need?  Vaccines are usually given at various ages, according to a schedule. Your health care provider will recommend vaccines for you based on your age, medical history, and lifestyle or other factors, such as travel or where you work. What tests do I need? Screening Your health care provider may recommend screening tests for certain conditions. This may include: Lipid and cholesterol levels. Diabetes screening. This is done by checking your blood sugar (glucose) after you have not eaten for a while (fasting). Hepatitis  B test. Hepatitis C test. HIV (human immunodeficiency virus) test. STI (sexually transmitted infection) testing, if you are at risk. Lung cancer screening. Prostate cancer screening. Colorectal cancer screening. Talk with your health care provider about your test results, treatment options, and if necessary, the need for more tests. Follow these instructions at home: Eating and drinking  Eat a diet that includes fresh fruits and vegetables, whole grains, lean protein, and low-fat dairy products. Take vitamin and mineral supplements as recommended by your health care provider. Do not drink alcohol if your health care provider tells you not to drink. If you drink alcohol: Limit how much you have to 0-2 drinks a day. Know how much alcohol is in your drink. In the [place] one drink equals one 12 oz bottle of beer (355 mL), one 5 oz glass of wine (148 mL), or one 1 oz glass of hard liquor (44 mL). Lifestyle Brush your teeth every morning and night with fluoride toothpaste. Floss one time each day. Exercise for at least 30 minutes 5 or more days each week. Do not use any products that contain nicotine or tobacco. These products include cigarettes, chewing tobacco, and vaping devices, such as e-cigarettes. If you need help quitting, ask your health care provider. Do not use drugs. If you are sexually active, practice safe sex. Use a condom or other form of protection to prevent STIs. Take aspirin only as told by your health care provider. Make sure that you understand how much to take and what form to take. Work with your health care provider to find out whether it is safe and beneficial for you to take aspirin daily. Find healthy ways to manage stress, such as:  Meditation, yoga, or listening to music. Journaling. Talking to a trusted person. Spending time with friends and family. Minimize exposure to UV radiation to reduce your risk of skin cancer. Safety Always wear your seat belt while  driving or riding in a vehicle. Do not drive: If you have been drinking alcohol. Do not ride with someone who has been drinking. When you are tired or distracted. While texting. If you have been using any mind-altering substances or drugs. Wear a helmet and other protective equipment during sports activities. If you have firearms in your house, make sure you follow all gun safety procedures. What's next? Go to your health care provider once a year for an annual wellness visit. Ask your health care provider how often you should have your eyes and teeth checked. Stay up to date on all vaccines. This information is not intended to replace advice given to you by your health care provider. Make sure you discuss any questions you have with your health care provider. Document Revised: 10/11/2020 Document Reviewed: 10/11/2020 Elsevier Patient Education  Ronald Griffin.

## 2021-10-09 NOTE — Assessment & Plan Note (Signed)
Patient encouraged to maintain heart healthy diet, regular exercise, adequate sleep. Consider daily probiotics. Take medications as prescribed. Labs ordered and reviewed 

## 2021-10-09 NOTE — Assessment & Plan Note (Signed)
Is not taking Adderall routinely

## 2021-10-10 NOTE — Assessment & Plan Note (Signed)
Overall he is doing well and does not require medication changes. Adderall has been helpful

## 2021-10-10 NOTE — Assessment & Plan Note (Signed)
Patient is overdue for follow up colonoscopy given his history. Referral is placed for him to return to gastroenterology for further surveillance

## 2021-10-22 ENCOUNTER — Encounter: Payer: Self-pay | Admitting: Family Medicine

## 2021-11-14 ENCOUNTER — Other Ambulatory Visit: Payer: Self-pay | Admitting: Family Medicine

## 2021-11-14 MED ORDER — SILDENAFIL CITRATE 25 MG PO TABS: 25.0000 mg | ORAL_TABLET | Freq: Every day | ORAL | 2 refills | Status: DC | PRN

## 2021-11-14 MED ORDER — TIZANIDINE HCL 4 MG PO CAPS: 4.0000 mg | ORAL_CAPSULE | Freq: Three times a day (TID) | ORAL | 1 refills | Status: AC | PRN

## 2021-11-14 MED ORDER — AMPHETAMINE-DEXTROAMPHET ER 20 MG PO CP24
20.0000 mg | ORAL_CAPSULE | ORAL | 0 refills | Status: DC
Start: 2021-11-14 — End: 2022-05-07

## 2021-11-14 MED ORDER — LEVOTHYROXINE SODIUM 25 MCG PO TABS
25.0000 ug | ORAL_TABLET | Freq: Every day | ORAL | 2 refills | Status: DC
Start: 2021-11-14 — End: 2022-02-07

## 2021-11-19 ENCOUNTER — Encounter: Payer: Medicare HMO | Admitting: Physical Medicine and Rehabilitation

## 2021-12-03 DIAGNOSIS — R079 Chest pain, unspecified: Secondary | ICD-10-CM | POA: Diagnosis not present

## 2021-12-27 ENCOUNTER — Encounter: Payer: Self-pay | Admitting: Family Medicine

## 2021-12-27 MED ORDER — SILDENAFIL CITRATE 50 MG PO TABS: 50.0000 mg | ORAL_TABLET | Freq: Every day | ORAL | 2 refills | Status: DC | PRN

## 2022-01-16 ENCOUNTER — Ambulatory Visit (AMBULATORY_SURGERY_CENTER): Payer: Medicare HMO | Admitting: *Deleted

## 2022-01-16 VITALS — Ht 70.0 in | Wt 230.0 lb

## 2022-01-16 DIAGNOSIS — Z800 Family history of malignant neoplasm of digestive organs: Secondary | ICD-10-CM

## 2022-01-16 DIAGNOSIS — Z86010 Personal history of colonic polyps: Secondary | ICD-10-CM

## 2022-01-16 MED ORDER — NA SULFATE-K SULFATE-MG SULF 17.5-3.13-1.6 GM/177ML PO SOLN: 1.0000 | Freq: Once | ORAL | 0 refills | Status: AC

## 2022-01-16 NOTE — Progress Notes (Signed)
No egg or soy allergy known to patient  No issues known to pt with past sedation with any surgeries or procedures Patient denies ever being told they had issues or difficulty with intubation  No FH of Malignant Hyperthermia Pt is not on diet pills Pt is not on home 02  Pt is not on blood thinners  Pt denies issues with constipation  No A fib or A flutter Have any cardiac testing pending--NO Pt instructed to use Singlecare.com or GoodRx for a price reduction on prep   

## 2022-01-25 ENCOUNTER — Encounter: Payer: Self-pay | Admitting: Internal Medicine

## 2022-02-07 ENCOUNTER — Other Ambulatory Visit: Payer: Self-pay | Admitting: Family Medicine

## 2022-02-11 ENCOUNTER — Encounter: Payer: Medicare HMO | Admitting: Internal Medicine

## 2022-02-11 ENCOUNTER — Ambulatory Visit: Payer: Medicare HMO | Admitting: Internal Medicine

## 2022-02-11 ENCOUNTER — Encounter: Payer: Self-pay | Admitting: Internal Medicine

## 2022-02-11 VITALS — BP 118/72 | HR 50 | Temp 99.1°F | Resp 13 | Ht 70.5 in | Wt 230.0 lb

## 2022-02-11 DIAGNOSIS — D120 Benign neoplasm of cecum: Secondary | ICD-10-CM

## 2022-02-11 DIAGNOSIS — Z800 Family history of malignant neoplasm of digestive organs: Secondary | ICD-10-CM | POA: Diagnosis not present

## 2022-02-11 DIAGNOSIS — D125 Benign neoplasm of sigmoid colon: Secondary | ICD-10-CM

## 2022-02-11 DIAGNOSIS — E039 Hypothyroidism, unspecified: Secondary | ICD-10-CM | POA: Diagnosis not present

## 2022-02-11 DIAGNOSIS — Z86010 Personal history of colonic polyps: Secondary | ICD-10-CM

## 2022-02-11 DIAGNOSIS — K6289 Other specified diseases of anus and rectum: Secondary | ICD-10-CM | POA: Diagnosis not present

## 2022-02-11 MED ORDER — SODIUM CHLORIDE 0.9 % IV SOLN: 500.0000 mL | Freq: Once | INTRAVENOUS | Status: DC

## 2022-02-11 NOTE — Progress Notes (Unsigned)
GASTROENTEROLOGY PROCEDURE H&P NOTE   Primary Care Physician: Mosie Lukes, MD    Reason for Procedure:  Personal history of adenomatous colon polyps and family history of colon cancer  Plan:    Colonoscopy  Patient is appropriate for endoscopic procedure(s) in the ambulatory ([place]) setting.  The nature of the procedure, as well as the risks, benefits, and alternatives were carefully and thoroughly reviewed with the patient. Ample time for discussion and questions allowed. The patient understood, was satisfied, and agreed to proceed.     HPI: Ronald Griffin is a 56 y.o. male who presents for surveillance colonoscopy.  Medical history as below.  Tolerated the prep.  No recent chest pain or shortness of breath.  No abdominal pain today.  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   Obesity 02/16/2017   Post-operative nausea and vomiting    Preventative health care 11/21/2014    Past Surgical History:  Procedure Laterality Date   COLONOSCOPY  2012   in [place] DISCECTOMY Left 02/09/2018   Procedure: Microdiscectomy - Left - Thoracic Seven-Thoracid Eight - Thoracic Eight - Thoracic Nine;  Surgeon: Earnie Larsson, MD;  Location: [facility];  Service: Neurosurgery;  Laterality: Left;  Microdiscectomy - Left - Thoracic Seven-Thoracid Eight - Thoracic Eight - Thoracic Nine   VARICOCELE EXCISION     VASECTOMY      Prior to Admission medications   Medication Sig Start Date End Date Taking? Authorizing Provider  amphetamine-dextroamphetamine (ADDERALL XR) 20 MG 24 hr capsule Take 1 capsule (20 mg total) by  mouth every morning. July 2023 11/14/21  Yes Mosie Lukes, MD  levothyroxine (SYNTHROID) 25 MCG tablet Take 1 tablet (25 mcg total) by mouth daily before breakfast. 02/07/22  Yes Mosie Lukes, MD  amoxicillin-clavulanate (AUGMENTIN) 875-125 MG tablet Take 1 tablet by mouth 2 (two) times daily. 01/07/22   [provider]  OVER THE COUNTER MEDICATION daily. [facility] TAKE ONE DAILY    [provider]  sildenafil (VIAGRA) 50 MG tablet Take 1-2 tablets (50-100 mg total) by mouth daily as needed for erectile dysfunction. 12/27/21   Mosie Lukes, MD  tiZANidine (ZANAFLEX) 4 MG capsule Take 1 capsule (4 mg total) by mouth 3 (three) times daily as needed for muscle spasms. Patient not taking: Reported on 01/16/2022 11/14/21   Mosie Lukes, MD    Current Outpatient Medications  Medication Sig Dispense Refill   amphetamine-dextroamphetamine (ADDERALL XR) 20 MG 24 hr capsule Take 1 capsule (20 mg total) by mouth every morning. July 2023 30 capsule 0   levothyroxine (SYNTHROID) 25 MCG tablet Take 1 tablet (25 mcg total) by mouth daily before breakfast. 90 tablet 0   amoxicillin-clavulanate (AUGMENTIN) 875-125 MG tablet Take 1 tablet by mouth 2 (two) times daily.     OVER THE COUNTER MEDICATION daily. [facility] TAKE ONE DAILY     sildenafil (VIAGRA) 50 MG tablet Take 1-2 tablets (50-100 mg total) by mouth daily as needed for erectile dysfunction. 20 tablet  2   tiZANidine (ZANAFLEX) 4 MG capsule Take 1 capsule (4 mg total) by mouth 3 (three) times daily as needed for muscle spasms. (Patient not taking: Reported on 01/16/2022) 30 capsule 1   Current Facility-Administered Medications  Medication Dose Route Frequency Provider Last Rate Last Admin   0.9 %  sodium chloride infusion  500 mL Intravenous Once Farrie Sann, Lajuan Lines, MD        Allergies as of 02/11/2022   (No Known Allergies)    Family History  Problem Relation Age of Onset   Lung cancer Mother    Breast cancer  Mother    Cancer Mother        breast age 31s, colon late 36s, lung, liver, skin   Colon cancer Mother 25   Bipolar disorder Father    Cancer Father 39       colon cancer   Dementia Father    Colon cancer Father 45   Obesity Brother    Lung cancer Paternal Uncle    Cancer Maternal Grandmother        ?   Heart disease Maternal Grandfather        MI, sudden   Stroke Paternal Grandmother 71   Bipolar disorder Paternal Grandfather    Dementia Paternal Grandfather    Cancer Paternal Grandfather        colon   Colon cancer Paternal Grandfather    Thyroid disease Daughter    Colon cancer Cousin 45   Esophageal cancer Neg Hx    Stomach cancer Neg Hx    Crohn's disease Neg Hx    Rectal cancer Neg Hx    Ulcerative colitis Neg Hx     Social History   Socioeconomic History   Marital status: Married    Spouse name: [name]   Number of children: 2   Years of education: college   Highest education level: Not on file  Occupational History   Not on file  Tobacco Use   Smoking status: Never    Passive exposure: Past (YEARS AGO,MOTHER USE TO SMOKE)   Smokeless tobacco: Never  Vaping Use   Vaping Use: Never used  Substance and Sexual Activity   Alcohol use: Yes    Alcohol/week: 3.0 - 5.0 standard drinks of alcohol    Types: 3 - 5 Standard drinks or equivalent per week    Comment: weekly 3-5 times per pt   Drug use: No   Sexual activity: Yes    Comment: lives with wife, works in [employer], part time at [employer], no dietary restrictions, exercises regularly  Other Topics Concern   Not on file  Social History Narrative   Patient works full time for [employer]. Patient lives at home with his wife [name]).   Education college.   Right handed.   Caffeine 4-6 cups daily.    Social Determinants of Health   Financial Resource Strain: Not on file  Food Insecurity: Not on file  Transportation Needs: Not on file  Physical Activity: Not on file  Stress:  Not on file  Social Connections: Not on file  Intimate Partner Violence: Not on file    Physical Exam: Vital signs in last 24 hours: '@BP'$  137/78   Pulse 67   Temp 99.1 F (37.3 C) (Skin)   Ht 5' 10.5" (1.791 m)   Wt 230 lb (104.3 kg)   SpO2 97%   BMI 32.54 kg/m  GEN: NAD EYE: Sclerae anicteric ENT: MMM CV: Non-tachycardic Pulm: CTA  b/l GI: Soft, NT/ND NEURO:  Alert & Oriented x 3   Zenovia Jarred, MD [facility]  02/11/2022 3:22 PM

## 2022-02-11 NOTE — Progress Notes (Signed)
Pt resting comfortably. VSS. Airway intact. SBAR complete to RN. All questions answered.   

## 2022-02-11 NOTE — Progress Notes (Signed)
Called to room to assist during endoscopic procedure.  Patient ID and intended procedure confirmed with present staff. Received instructions for my participation in the procedure from the performing physician.  

## 2022-02-11 NOTE — Patient Instructions (Signed)
Information on polyps given to you today.  Await pathology results.  Resume previous diet and medications.    YOU HAD AN ENDOSCOPIC PROCEDURE TODAY AT THE [facility]:   Refer to the procedure report that was given to you for any specific questions about what was found during the examination.  If the procedure report does not answer your questions, please call your gastroenterologist to clarify.  If you requested that your care partner not be given the details of your procedure findings, then the procedure report has been included in a sealed envelope for you to review at your convenience later.  YOU SHOULD EXPECT: Some feelings of bloating in the abdomen. Passage of more gas than usual.  Walking can help get rid of the air that was put into your GI tract during the procedure and reduce the bloating. If you had a lower endoscopy (such as a colonoscopy or flexible sigmoidoscopy) you may notice spotting of blood in your stool or on the toilet paper. If you underwent a bowel prep for your procedure, you may not have a normal bowel movement for a few days.  Please Note:  You might notice some irritation and congestion in your nose or some drainage.  This is from the oxygen used during your procedure.  There is no need for concern and it should clear up in a day or so.  SYMPTOMS TO REPORT IMMEDIATELY:  Following lower endoscopy (colonoscopy or flexible sigmoidoscopy):  Excessive amounts of blood in the stool  Significant tenderness or worsening of abdominal pains  Swelling of the abdomen that is new, acute  Fever of 100F or higher  For urgent or emergent issues, a gastroenterologist can be reached at any hour by calling [phone]. Do not use MyChart messaging for urgent concerns.    DIET:  We do recommend a small meal at first, but then you may proceed to your regular diet.  Drink plenty of fluids but you should avoid alcoholic beverages for 24 hours.  ACTIVITY:  You should  plan to take it easy for the rest of today and you should NOT DRIVE or use heavy machinery until tomorrow (because of the sedation medicines used during the test).    FOLLOW UP: Our staff will call the number listed on your records the next business day following your procedure.  We will call around 7:15- 8:00 am to check on you and address any questions or concerns that you may have regarding the information given to you following your procedure. If we do not reach you, we will leave a message.     If any biopsies were taken you will be contacted by phone or by letter within the next 1-3 weeks.  Please call us at [phone] if you have not heard about the biopsies in 3 weeks.    SIGNATURES/CONFIDENTIALITY: You and/or your care partner have signed paperwork which will be entered into your electronic medical record.  These signatures attest to the fact that that the information above on your After Visit Summary has been reviewed and is understood.  Full responsibility of the confidentiality of this discharge information lies with you and/or your care-partner.  

## 2022-02-11 NOTE — Op Note (Signed)
[facility] Patient Name: Ronald Griffin Procedure Date: 02/11/2022 3:12 PM MRN: [id number] Endoscopist: Jerene Bears , MD Age: 55 Referring MD:  Date of Birth: 04/16/1966 Gender: Male Account #: [id number] Procedure:                Colonoscopy Indications:              High risk colon cancer surveillance: Personal                            history of multiple adenomas at last colonoscopy                            May 2019 (5 adenomas) Family history of colon                            cancer in multiple first-degree relatives Medicines:                Monitored Anesthesia Care Procedure:                Pre-Anesthesia Assessment:                           - Prior to the procedure, a History and Physical                            was performed, and patient medications and                            allergies were reviewed. The patient's tolerance of                            previous anesthesia was also reviewed. The risks                            and benefits of the procedure and the sedation                            options and risks were discussed with the patient.                            All questions were answered, and informed consent                            was obtained. Prior Anticoagulants: The patient has                            taken no previous anticoagulant or antiplatelet                            agents. ASA Grade Assessment: II - A patient with                            mild systemic disease. After reviewing the risks  and benefits, the patient was deemed in                            satisfactory condition to undergo the procedure.                           After obtaining informed consent, the colonoscope                            was passed under direct vision. Throughout the                            procedure, the patient's blood pressure, pulse, and                            oxygen saturations were  monitored continuously. The                            CF [id number] #[id number] was introduced through the anus                            and advanced to the cecum, identified by                            appendiceal orifice and ileocecal valve. The                            colonoscopy was performed without difficulty. The                            patient tolerated the procedure well. The quality                            of the bowel preparation was good. The ileocecal                            valve, appendiceal orifice, and rectum were                            photographed. Scope In: 3:31:11 PM Scope Out: 3:50:49 PM Scope Withdrawal Time: 0 hours 14 minutes 49 seconds  Total Procedure Duration: 0 hours 19 minutes 38 seconds  Findings:                 The digital rectal exam was normal.                           A 5 mm polyp was found in the cecum. The polyp was                            sessile. The polyp was removed with a cold snare.                            Resection and retrieval were complete.  A 5 mm polyp was found in the sigmoid colon. The                            polyp was sessile. The polyp was removed with a                            cold snare. Resection and retrieval were complete.                           Internal hemorrhoids were found during                            retroflexion. The hemorrhoids were medium-sized.                           A 4 mm polyp versus hypertrophied anal papilla                            versus skin tag was found in the anus. The polyp                            was pedunculated. Biopsies were taken with a cold                            forceps for histology to exclude AIN. Complications:            No immediate complications. Estimated Blood Loss:     Estimated blood loss was minimal. Impression:               - One 5 mm polyp in the cecum, removed with a cold                            snare. Resected  and retrieved.                           - One 5 mm polyp in the sigmoid colon, removed with                            a cold snare. Resected and retrieved.                           - One 4 mm polyp at the anus. Biopsied.                           - Internal hemorrhoids. Recommendation:           - Patient has a contact number available for                            emergencies. The signs and symptoms of potential                            delayed complications were discussed with the  patient. Return to normal activities tomorrow.                            Written discharge instructions were provided to the                            patient.                           - Resume previous diet.                           - Continue present medications.                           - Await pathology results.                           - Repeat colonoscopy is recommended for                            surveillance. The colonoscopy date will be                            determined after pathology results from today's                            exam become available for review. Jerene Bears, MD 02/11/2022 3:58:56 PM This report has been signed electronically.

## 2022-02-12 ENCOUNTER — Telehealth: Payer: Self-pay

## 2022-02-12 NOTE — Telephone Encounter (Signed)
No answer, left message to call if having any issues or concerns, B.Kallan Bischoff RN 

## 2022-02-14 ENCOUNTER — Encounter: Payer: Self-pay | Admitting: Internal Medicine

## 2022-04-14 NOTE — Assessment & Plan Note (Signed)
Uses meds infrequently

## 2022-04-14 NOTE — Assessment & Plan Note (Signed)
Encourage heart healthy diet such as MIND or DASH diet, increase exercise, avoid trans fats, simple carbohydrates and processed foods, consider a krill or fish or flaxseed oil cap daily.  °

## 2022-04-14 NOTE — Assessment & Plan Note (Signed)
Encouraged DASH or MIND diet, decrease po intake and increase exercise as tolerated. Needs 7-8 hours of sleep nightly. Avoid trans fats, eat small, frequent meals every 4-5 hours with lean proteins, complex carbs and healthy fats. Minimize simple carbs, high fat foods and processed foods 

## 2022-04-14 NOTE — Assessment & Plan Note (Signed)
On Levothyroxine, continue to monitor. Patient acknowledges he has not been taking

## 2022-04-15 ENCOUNTER — Ambulatory Visit (INDEPENDENT_AMBULATORY_CARE_PROVIDER_SITE_OTHER): Payer: Medicare HMO | Admitting: Family Medicine

## 2022-04-15 VITALS — BP 122/80 | HR 64 | Temp 98.0°F | Resp 16 | Ht 70.0 in | Wt 247.4 lb

## 2022-04-15 DIAGNOSIS — R41840 Attention and concentration deficit: Secondary | ICD-10-CM

## 2022-04-15 DIAGNOSIS — K429 Umbilical hernia without obstruction or gangrene: Secondary | ICD-10-CM | POA: Insufficient documentation

## 2022-04-15 DIAGNOSIS — E669 Obesity, unspecified: Secondary | ICD-10-CM

## 2022-04-15 DIAGNOSIS — E785 Hyperlipidemia, unspecified: Secondary | ICD-10-CM

## 2022-04-15 DIAGNOSIS — E039 Hypothyroidism, unspecified: Secondary | ICD-10-CM | POA: Diagnosis not present

## 2022-04-15 DIAGNOSIS — M5104 Intervertebral disc disorders with myelopathy, thoracic region: Secondary | ICD-10-CM

## 2022-04-15 DIAGNOSIS — L719 Rosacea, unspecified: Secondary | ICD-10-CM

## 2022-04-15 LAB — CBC WITH DIFFERENTIAL/PLATELET
Basophils Absolute: 0 10*3/uL (ref 0.0–0.1)
Basophils Relative: 0.6 % (ref 0.0–3.0)
Eosinophils Absolute: 0.3 10*3/uL (ref 0.0–0.7)
Eosinophils Relative: 4.8 % (ref 0.0–5.0)
HCT: 46.7 % (ref 39.0–52.0)
Hemoglobin: 15.8 g/dL (ref 13.0–17.0)
Lymphocytes Relative: 26.6 % (ref 12.0–46.0)
Lymphs Abs: 1.5 10*3/uL (ref 0.7–4.0)
MCHC: 33.9 g/dL (ref 30.0–36.0)
MCV: 91.1 fl (ref 78.0–100.0)
Monocytes Absolute: 0.5 10*3/uL (ref 0.1–1.0)
Monocytes Relative: 9.6 % (ref 3.0–12.0)
Neutro Abs: 3.3 10*3/uL (ref 1.4–7.7)
Neutrophils Relative %: 58.4 % (ref 43.0–77.0)
Platelets: 242 10*3/uL (ref 150.0–400.0)
RBC: 5.12 Mil/uL (ref 4.22–5.81)
RDW: 13.1 % (ref 11.5–15.5)
WBC: 5.6 10*3/uL (ref 4.0–10.5)

## 2022-04-15 LAB — COMPREHENSIVE METABOLIC PANEL
ALT: 20 U/L (ref 0–53)
AST: 17 U/L (ref 0–37)
Albumin: 4.7 g/dL (ref 3.5–5.2)
Alkaline Phosphatase: 72 U/L (ref 39–117)
BUN: 17 mg/dL (ref 6–23)
CO2: 25 mEq/L (ref 19–32)
Calcium: 9.3 mg/dL (ref 8.4–10.5)
Chloride: 102 mEq/L (ref 96–112)
Creatinine, Ser: 1.02 mg/dL (ref 0.40–1.50)
GFR: 81.17 mL/min (ref >60.00)
Glucose, Bld: 99 mg/dL (ref 70–99)
Potassium: 4.4 mEq/L (ref 3.5–5.1)
Sodium: 138 mEq/L (ref 135–145)
Total Bilirubin: 0.7 mg/dL (ref 0.2–1.2)
Total Protein: 7.2 g/dL (ref 6.0–8.3)

## 2022-04-15 LAB — LIPID PANEL
Cholesterol: 223 mg/dL — ABNORMAL HIGH (ref 0–200)
HDL: 56.7 mg/dL (ref >39.00)
LDL Cholesterol: 137 mg/dL — ABNORMAL HIGH (ref 0–99)
NonHDL: 166.69
Total CHOL/HDL Ratio: 4
Triglycerides: 148 mg/dL (ref 0.0–149.0)
VLDL: 29.6 mg/dL (ref 0.0–40.0)

## 2022-04-15 LAB — TSH: TSH: 4.41 u[IU]/mL (ref 0.35–5.50)

## 2022-04-15 MED ORDER — AZELAIC ACID 15 % EX GEL: 1.0000 | Freq: Two times a day (BID) | CUTANEOUS | 3 refills | Status: AC

## 2022-04-15 NOTE — Assessment & Plan Note (Signed)
Asymptomatic and reducible, warned what to watch for as an emergency but will not refer for surgery unless he becomes symptomatic

## 2022-04-15 NOTE — Assessment & Plan Note (Signed)
Given a refill on Azelaic Acid gel 15 % which works well for him when he is flared

## 2022-04-15 NOTE — Patient Instructions (Addendum)
[facility] do not drink Tetanus and shingles Covid and flu  Try CBD Daily extra strength, menthol cream   Dyslipidemia Dyslipidemia is an imbalance of waxy, fat-like substances (lipids) in the blood. The body needs lipids in small amounts. Dyslipidemia often involves a high level of cholesterol or triglycerides, which are types of lipids. Common forms of dyslipidemia include: High levels of LDL cholesterol. LDL is the type of cholesterol that causes fatty deposits (plaques) to build up in the blood vessels that carry blood away from the heart (arteries). Low levels of HDL cholesterol. HDL cholesterol is the type of cholesterol that protects against heart disease. High levels of HDL remove the LDL buildup from arteries. High levels of triglycerides. Triglycerides are a fatty substance in the blood that is linked to a buildup of plaques in the arteries. What are the causes? There are two main types of dyslipidemia: primary and secondary. Primary dyslipidemia is caused by changes (mutations) in genes that are passed down through families (inherited). These mutations cause several types of dyslipidemia. Secondary dyslipidemia may be caused by various risk factors that can lead to the disease, such as lifestyle choices and certain medical conditions. What increases the risk? You are more likely to develop this condition if you are an older man or if you are a woman who has gone through menopause. Other risk factors include: Having a family history of dyslipidemia. Taking certain medicines, including birth control pills, steroids, some diuretics, and beta-blockers. Eating a diet high in saturated fat. Smoking cigarettes or excessive alcohol intake. Having certain medical conditions such as diabetes, polycystic ovary syndrome (PCOS), kidney disease, liver disease, or hypothyroidism. Not exercising regularly. Being overweight or obese with too much belly fat. What are the signs or symptoms? In  most cases, dyslipidemia does not usually cause any symptoms. In severe cases, very high lipid levels can cause: Fatty bumps under the skin (xanthomas). A white or gray ring around the black center (pupil) of the eye. Very high triglyceride levels can cause inflammation of the pancreas (pancreatitis). How is this diagnosed? Your health care provider may diagnose dyslipidemia based on a routine blood test (fasting blood test). Because most people do not have symptoms of the condition, this blood testing (lipid profile) is done on adults age 27 and older and is repeated every 4-6 years. This test checks: Total cholesterol. This measures the total amount of cholesterol in your blood, including LDL cholesterol, HDL cholesterol, and triglycerides. A healthy number is below 200 mg/dL (5.17 mmol/L). LDL cholesterol. The target number for LDL cholesterol is different for each person, depending on individual risk factors. A healthy number is usually below 100 mg/dL (2.59 mmol/L). Ask your health care provider what your LDL cholesterol should be. HDL cholesterol. An HDL level of 60 mg/dL (1.55 mmol/L) or higher is best because it helps to protect against heart disease. A number below 40 mg/dL (1.03 mmol/L) for men or below 50 mg/dL (1.29 mmol/L) for women increases the risk for heart disease. Triglycerides. A healthy triglyceride number is below 150 mg/dL (1.69 mmol/L). If your lipid profile is abnormal, your health care provider may do other blood tests. How is this treated? Treatment depends on the type of dyslipidemia that you have and your other risk factors for heart disease and stroke. Your health care provider will have a target range for your lipid levels based on this information. Treatment for dyslipidemia starts with lifestyle changes, such as diet and exercise. Your health care provider may  recommend that you: Get regular exercise. Make changes to your diet. Quit smoking if you smoke. Limit your  alcohol intake. If diet changes and exercise do not help you reach your goals, your health care provider may also prescribe medicine to lower lipids. The most commonly prescribed type of medicine lowers your LDL cholesterol (statin drug). If you have a high triglyceride level, your provider may prescribe another type of drug (fibrate) or an omega-3 fish oil supplement, or both. Follow these instructions at home: Eating and drinking  Follow instructions from your health care provider or dietitian about eating or drinking restrictions. Eat a healthy diet as told by your health care provider. This can help you reach and maintain a healthy weight, lower your LDL cholesterol, and raise your HDL cholesterol. This may include: Limiting your calories, if you are overweight. Eating more fruits, vegetables, whole grains, fish, and lean meats. Limiting saturated fat, trans fat, and cholesterol. Do not drink alcohol if: Your health care provider tells you not to drink. You are pregnant, may be pregnant, or are planning to become pregnant. If you drink alcohol: Limit how much you have to: 0-1 drink a day for women. 0-2 drinks a day for men. Know how much alcohol is in your drink. In the [place] one drink equals one 12 oz bottle of beer (355 mL), one 5 oz glass of wine (148 mL), or one 1 oz glass of hard liquor (44 mL). Activity Get regular exercise. Start an exercise and strength training program as told by your health care provider. Ask your health care provider what activities are safe for you. Your health care provider may recommend: 30 minutes of aerobic activity 4-6 days a week. Brisk walking is an example of aerobic activity. Strength training 2 days a week. General instructions Do not use any products that contain nicotine or tobacco. These products include cigarettes, chewing tobacco, and vaping devices, such as e-cigarettes. If you need help quitting, ask your health care provider. Take  over-the-counter and prescription medicines only as told by your health care provider. This includes supplements. Keep all follow-up visits. This is important. Contact a health care provider if: You are having trouble sticking to your exercise or diet plan. You are struggling to quit smoking or to control your use of alcohol. Summary Dyslipidemia often involves a high level of cholesterol or triglycerides, which are types of lipids. Treatment depends on the type of dyslipidemia that you have and your other risk factors for heart disease and stroke. Treatment for dyslipidemia starts with lifestyle changes, such as diet and exercise. Your health care provider may prescribe medicine to lower lipids. This information is not intended to replace advice given to you by your health care provider. Make sure you discuss any questions you have with your health care provider. Document Revised: 11/16/2021 Document Reviewed: 06/19/2020 Elsevier Patient Education  [place].

## 2022-04-15 NOTE — Progress Notes (Signed)
Subjective:    Patient ID: Ronald Griffin, male    DOB: 11-13-1965, 56 y.o.   MRN: [id number]  Chief Complaint  Patient presents with   Follow-up    Follow up    HPI Patient is in today for follow up on chronic medical concerns. No recent febrile illness or acute concerns.he has been hydrating well and staying active. Denies CP/palp/SOB/HA/congestion/fevers/GI or GU c/o. Taking meds as prescribed. Requests a refill on his Azelaic Acid gel that helps his rosacea when it flares up.  Past Medical History:  Diagnosis Date   Dizziness    Elevated antinuclear antibody (ANA) level 11/21/2014   Erectile dysfunction 11/21/2014   Frequent headaches    Hand pain, right 02/16/2017   Headache 03/29/2014   History of colonic polyps 11/21/2014   Hyperlipidemia    Knee pain, right 11/21/2014   Low back pain 04/03/2015   Motion sickness 11/21/2014   Myelopathy ([place])    thoracic   Obesity 02/16/2017   Post-operative nausea and vomiting    Preventative health care 11/21/2014    Past Surgical History:  Procedure Laterality Date   COLONOSCOPY  2012   in [place] DISCECTOMY Left 02/09/2018   Procedure: Microdiscectomy - Left - Thoracic Seven-Thoracid Eight - Thoracic Eight - Thoracic Nine;  Surgeon: Earnie Larsson, MD;  Location: [facility];  Service: Neurosurgery;  Laterality: Left;  Microdiscectomy - Left - Thoracic Seven-Thoracid Eight - Thoracic Eight - Thoracic Nine   VARICOCELE EXCISION     VASECTOMY      Family History  Problem Relation Age of Onset   Lung cancer Mother    Breast cancer Mother    Cancer Mother        breast age 32s, colon late 33s, lung, liver, skin   Colon cancer Mother 68   Bipolar disorder Father    Cancer Father 29       colon cancer   Dementia Father    Colon cancer Father 88   Obesity Brother    Lung cancer Paternal Uncle    Cancer Maternal Grandmother        ?   Heart disease Maternal Grandfather        MI, sudden    Stroke Paternal Grandmother 31   Bipolar disorder Paternal Grandfather    Dementia Paternal Grandfather    Cancer Paternal Grandfather        colon   Colon cancer Paternal Grandfather    Thyroid disease Daughter    Colon cancer Cousin 64   Esophageal cancer Neg Hx    Stomach cancer Neg Hx    Crohn's disease Neg Hx    Rectal cancer Neg Hx    Ulcerative colitis Neg Hx     Social History   Socioeconomic History   Marital status: Married    Spouse name: [name]   Number of children: 2   Years of education: college   Highest education level: Not on file  Occupational History   Not on file  Tobacco Use   Smoking status: Never    Passive exposure: Past (YEARS AGO,MOTHER USE TO SMOKE)   Smokeless tobacco: Never  Vaping Use   Vaping Use: Never used  Substance and Sexual Activity   Alcohol use: Yes    Alcohol/week: 3.0 - 5.0 standard drinks of alcohol    Types: 3 - 5 Standard drinks or equivalent per week    Comment:  weekly 3-5 times per pt   Drug use: No   Sexual activity: Yes    Comment: lives with wife, works in [employer], part time at [employer], no dietary restrictions, exercises regularly  Other Topics Concern   Not on file  Social History Narrative   Patient works full time for [employer]. Patient lives at home with his wife [name]).   Education college.   Right handed.   Caffeine 4-6 cups daily.    Social Determinants of Health   Financial Resource Strain: Not on file  Food Insecurity: Not on file  Transportation Needs: Not on file  Physical Activity: Not on file  Stress: Not on file  Social Connections: Not on file  Intimate Partner Violence: Not on file    Outpatient Medications Prior to Visit  Medication Sig Dispense Refill   amoxicillin-clavulanate (AUGMENTIN) 875-125 MG tablet Take 1 tablet by mouth 2 (two) times daily.     amphetamine-dextroamphetamine (ADDERALL XR) 20 MG 24 hr capsule Take 1 capsule (20 mg total) by mouth  every morning. July 2023 30 capsule 0   levothyroxine (SYNTHROID) 25 MCG tablet Take 1 tablet (25 mcg total) by mouth daily before breakfast. 90 tablet 0   OVER THE COUNTER MEDICATION daily. [facility] TAKE ONE DAILY     sildenafil (VIAGRA) 50 MG tablet Take 1-2 tablets (50-100 mg total) by mouth daily as needed for erectile dysfunction. 20 tablet 2   tiZANidine (ZANAFLEX) 4 MG capsule Take 1 capsule (4 mg total) by mouth 3 (three) times daily as needed for muscle spasms. 30 capsule 1   No facility-administered medications prior to visit.    No Known Allergies  Review of Systems  Constitutional:  Negative for fever and malaise/fatigue.  HENT:  Negative for congestion.   Eyes:  Negative for blurred vision.  Respiratory:  Negative for shortness of breath.   Cardiovascular:  Negative for chest pain, palpitations and leg swelling.  Gastrointestinal:  Negative for abdominal pain, blood in stool and nausea.  Genitourinary:  Negative for dysuria and frequency.  Musculoskeletal:  Negative for falls.  Skin:  Negative for rash.  Neurological:  Negative for dizziness, loss of consciousness and headaches.  Endo/Heme/Allergies:  Negative for environmental allergies.  Psychiatric/Behavioral:  Negative for depression. The patient is not nervous/anxious.        Objective:    Physical Exam Constitutional:      General: He is not in acute distress.    Appearance: Normal appearance. He is not ill-appearing or toxic-appearing.  HENT:     Head: Normocephalic and atraumatic.     Right Ear: External ear normal.     Left Ear: External ear normal.     Nose: Nose normal.  Eyes:     General:        Right eye: No discharge.        Left eye: No discharge.  Cardiovascular:     Heart sounds: Normal heart sounds. No murmur heard. Pulmonary:     Effort: Pulmonary effort is normal.     Breath sounds: No wheezing.  Abdominal:     General: Bowel sounds are normal.     Tenderness: There is no  abdominal tenderness.  Musculoskeletal:     Right lower leg: No edema.     Left lower leg: No edema.  Skin:    Findings: No rash.  Neurological:     General: No focal deficit present.     Mental Status: He is  alert and oriented to person, place, and time.  Psychiatric:        Behavior: Behavior normal.     BP 122/80 (BP Location: Right Arm, Patient Position: Sitting, Cuff Size: Normal)   Pulse 64   Temp 98 F (36.7 C) (Oral)   Resp 16   Ht '5\' 10"'$  (1.778 m)   Wt 247 lb 6.4 oz (112.2 kg)   SpO2 98%   BMI 35.50 kg/m  Wt Readings from Last 3 Encounters:  04/15/22 247 lb 6.4 oz (112.2 kg)  02/11/22 230 lb (104.3 kg)  01/16/22 230 lb (104.3 kg)    Diabetic Foot Exam - Simple   No data filed    Lab Results  Component Value Date   WBC 4.6 04/03/2020   HGB 14.7 04/03/2020   HCT 43.2 04/03/2020   PLT 202.0 04/03/2020   GLUCOSE 96 07/13/2020   CHOL 244 (H) 07/13/2020   TRIG 95.0 07/13/2020   HDL 65.70 07/13/2020   LDLDIRECT 140.0 02/14/2017   LDLCALC 160 (H) 07/13/2020   ALT 20 07/13/2020   AST 19 07/13/2020   NA 138 07/13/2020   K 4.4 07/13/2020   CL 102 07/13/2020   CREATININE 0.86 07/13/2020   BUN 19 07/13/2020   CO2 28 07/13/2020   TSH 4.18 07/13/2020   PSA 0.61 11/09/2019   HGBA1C 5.6 04/03/2020    Lab Results  Component Value Date   TSH 4.18 07/13/2020   Lab Results  Component Value Date   WBC 4.6 04/03/2020   HGB 14.7 04/03/2020   HCT 43.2 04/03/2020   MCV 91.7 04/03/2020   PLT 202.0 04/03/2020   Lab Results  Component Value Date   NA 138 07/13/2020   K 4.4 07/13/2020   CO2 28 07/13/2020   GLUCOSE 96 07/13/2020   BUN 19 07/13/2020   CREATININE 0.86 07/13/2020   BILITOT 0.9 07/13/2020   ALKPHOS 67 07/13/2020   AST 19 07/13/2020   ALT 20 07/13/2020   PROT 7.0 07/13/2020   ALBUMIN 4.3 07/13/2020   CALCIUM 9.2 07/13/2020   ANIONGAP 7 02/09/2018   GFR 96.81 07/13/2020   Lab Results  Component Value Date   CHOL 244 (H) 07/13/2020    Lab Results  Component Value Date   HDL 65.70 07/13/2020   Lab Results  Component Value Date   LDLCALC 160 (H) 07/13/2020   Lab Results  Component Value Date   TRIG 95.0 07/13/2020   Lab Results  Component Value Date   CHOLHDL 4 07/13/2020   Lab Results  Component Value Date   HGBA1C 5.6 04/03/2020       Assessment & Plan:  Hyperlipidemia, unspecified hyperlipidemia type Assessment & Plan: Encourage heart healthy diet such as MIND or DASH diet, increase exercise, avoid trans fats, simple carbohydrates and processed foods, consider a krill or fish or flaxseed oil cap daily.   Orders: -     Comprehensive metabolic panel -     Lipid panel  Hypothyroidism, unspecified type Assessment & Plan: On Levothyroxine, continue to monitor. Patient acknowledges he has not been taking  Orders: -     TSH  Obesity, unspecified classification, unspecified obesity type, unspecified whether serious comorbidity present Assessment & Plan: Encouraged DASH or MIND diet, decrease po intake and increase exercise as tolerated. Needs 7-8 hours of sleep nightly. Avoid trans fats, eat small, frequent meals every 4-5 hours with lean proteins, complex carbs and healthy fats. 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 Try topical rubs and stay active without over doing   Rosacea, acne Assessment & Plan: Given a refill on Azelaic Acid gel 15 % which works well for him when  he is flared   Other orders -     Azelaic Acid; Apply 1 Dose topically 2 (two) times daily. After skin is thoroughly washed and patted dry, gently but thoroughly massage a thin film of azelaic acid cream into the affected area twice daily, in the morning and evening.  Dispense: 50 g; Refill: 3    Penni Homans, MD

## 2022-04-15 NOTE — Assessment & Plan Note (Signed)
Has numbness and pain into legs at the end of day are worse if he is overly active. Try topical rubs and stay active without over doing

## 2022-05-07 ENCOUNTER — Other Ambulatory Visit: Payer: Self-pay | Admitting: Family Medicine

## 2022-05-08 MED ORDER — AMPHETAMINE-DEXTROAMPHET ER 20 MG PO CP24: 20.0000 mg | ORAL_CAPSULE | ORAL | 0 refills | Status: DC

## 2022-05-08 NOTE — Telephone Encounter (Signed)
Requesting: Adderall XR '20mg'$   Contract: 10/09/21 UDS: None Last Visit: 04/15/22 Next Visit: None Last Refill: 11/14/21 #30 and 0RF   Please Advise

## 2022-06-24 ENCOUNTER — Telehealth: Payer: Self-pay | Admitting: Family Medicine

## 2022-06-24 NOTE — Telephone Encounter (Signed)
Contacted Irie Merker Wichert to schedule their annual wellness visit. Appointment made for 06/28/2022.  Sherol Dade; [facility] Direct Dial: [phone]

## 2022-06-28 ENCOUNTER — Ambulatory Visit (INDEPENDENT_AMBULATORY_CARE_PROVIDER_SITE_OTHER): Payer: Medicare HMO | Admitting: *Deleted

## 2022-06-28 DIAGNOSIS — Z0000 Encounter for general adult medical examination without abnormal findings: Secondary | ICD-10-CM

## 2022-06-28 NOTE — Progress Notes (Signed)
Subjective:   Ronald Griffin is a 57 y.o. male who presents for an Initial Medicare Annual Wellness Visit.  I connected with  Ronald Griffin on 06/28/22 by a audio enabled telemedicine application and verified that I am speaking with the correct person using two identifiers.  Patient Location: Home  Provider Location: Office/Clinic  I discussed the limitations of evaluation and management by telemedicine. The patient expressed understanding and agreed to proceed.   Review of Systems     Cardiac Risk Factors include: advanced age (>20mn, >>72women);male gender;dyslipidemia;obesity (BMI >30kg/m2)     Objective:    There were no vitals filed for this visit. There is no height or weight on file to calculate BMI.     06/28/2022    3:01 PM 12/08/2019    8:11 AM 01/16/2018   11:26 AM 09/15/2017    1:04 PM 12/31/2016   11:04 PM 03/15/2014    1:55 PM  Advanced Directives  Does Patient Have a Medical Advance Directive? Yes Yes Yes No No No  Type of AParamed[place]of [place]alling [place] will [place] will      Does patient want to make changes to medical advance directive? No - Patient declined       Copy of [place]? No - copy requested No - copy requested      Would patient like information on creating a medical advance directive?      No - patient declined information    Current Medications (verified) Outpatient Encounter Medications as of 06/28/2022  Medication Sig   amoxicillin-clavulanate (AUGMENTIN) 875-125 MG tablet Take 1 tablet by mouth 2 (two) times daily.   amphetamine-dextroamphetamine (ADDERALL XR) 20 MG 24 hr capsule Take 1 capsule (20 mg total) by mouth every morning. January 2024   Azelaic Acid 15 % gel Apply 1 Dose topically 2 (two) times daily. After skin is thoroughly washed and patted dry, gently but thoroughly massage a thin film of azelaic acid cream into the affected area twice daily, in the  morning and evening.   levothyroxine (SYNTHROID) 25 MCG tablet Take 1 tablet (25 mcg total) by mouth daily before breakfast.   OVER THE COUNTER MEDICATION daily. [facility] TAKE ONE DAILY   sildenafil (VIAGRA) 50 MG tablet Take 1-2 tablets (50-100 mg total) by mouth daily as needed for erectile dysfunction.   tiZANidine (ZANAFLEX) 4 MG capsule Take 1 capsule (4 mg total) by mouth 3 (three) times daily as needed for muscle spasms.   No facility-administered encounter medications on file as of 06/28/2022.    Allergies (verified) Patient has no known allergies.   History: Past Medical History:  Diagnosis Date   Depression 2019   Dizziness    Elevated antinuclear antibody (ANA) level 11/21/2014   Erectile dysfunction 11/21/2014   Frequent headaches    Hand pain, right 02/16/2017   Headache 03/29/2014   History of colonic polyps 11/21/2014   Hyperlipidemia    Knee pain, right 11/21/2014   Low back pain 04/03/2015   Motion sickness 11/21/2014   Myelopathy (HCC)    thoracic   Ne[place]romuscular disorder ([place] 2019   Obesity 02/16/2017   Post-operative nausea and vomiting    Preventative health care 11/21/2014   Sleep apnea    Past Surgical History:  Procedure Laterality Date   COL[place]OSCOPY  2012   [place]  02/09[place]2018   T-7,8,9   THORACIC DISCECTOMY Left 02/09/2018   Procedure: Microdiscectomy - Left - Thoracic Seven-Thoracid Eight - Thoracic Eight - Thoracic Nine;  Surgeon: Earnie Larsson, MD;  Location: [facility];  Service: Neurosurgery;  Laterality: Left;  Microdiscectomy - Left - Thoracic Seven-Thoracid Eight - Thoracic Eight - Thoracic Nine   VARICOCELE EXCISION     VASECTOMY     Family History  Problem Relation Age of Onset   Lung cancer Mother    Breast cancer Mother    Cancer Mother        breast age 16s, colon late 36s, lung, liver, skin   Colon cancer Mother 23   Bipolar disorder Father    Cancer Father 7       colon  cancer   Dementia Father    Colon cancer Father 55   Obesity Brother    Lung cancer Paternal Uncle    Cancer Maternal Grandmother        ?   Heart disease Maternal Grandfather        MI, sudden   Stroke Paternal Grandmother 53   Bipolar disorder Paternal Grandfather    Dementia Paternal Grandfather    Cancer Paternal Grandfather        colon   Colon cancer Paternal Grandfather    Thyroid disease Daughter    Colon cancer Cousin 15   Esophageal cancer Neg Hx    Stomach cancer Neg Hx    Crohn's disease Neg Hx    Rectal cancer Neg Hx    Ulcerative colitis Neg Hx    Social History   Socioeconomic History   Marital status: Married    Spouse name: Ronald Griffin   Number of children: 2   Years of education: college   Highest education level: Not on file  Occupational History   Not on file  Tobacco Use   Smoking status: Never    Passive exposure: Past (YEARS AGO,MOTHER USE TO SMOKE)   Smokeless tobacco: Never  Vaping Use   Vaping Use: Never used  Substance and Sexual Activity   Alcohol use: Yes    Alcohol/week: 3.0 - 5.0 standard drinks of alcohol    Types: 3 - 5 Standard drinks or equivalent per week    Comment: weekly 3-5 times per pt   Drug use: No   Sexual activity: Yes    Comment: lives with wife, works in [employer], part time at [employer], no dietary restrictions, exercises regularly  Other Topics Concern   Not on file  Social History Narrative   Patient works full time for [employer]. [employer]. Patient lives at home with his wife Ronald Griffin).   Education college.   Right handed.   Caffeine 4-6 cups daily.    Social Determinants of Health   Financial Resource Strain: Low Risk  (06/28/2022)   Overall Financial Resource Strain (CARDIA)    Difficulty of Paying Living Expenses: Not very hard  Food Insecurity: Food Insecurity Present (06/28/2022)   Hunger Vital Sign    Worried About Running Out of Food in the Last Year: Sometimes true    Ran Out of Food in the  Last Year: Sometimes true  Transportation Needs: No Transportation Needs (06/28/2022)   PRAPARE - Hydrologist (Medical): No    Lack of Transportation (Non-Medical): No  Physical Activity: Inactive (06/28/2022)   Exercise Vital Sign    Days of Exercise per Week: 0 days    Minutes of Exercise per  Session: 0 min  Stress: Stress Concern Present (06/28/2022)   Antoine    Feeling of Stress : To some extent  Social Connections: Socially Integrated (06/28/2022)   Social Connection and Isolation Panel [NHANES]    Frequency of Communication with Friends and Family: Three times a week    Frequency of Social Gatherings with Friends and Family: Once a week    Attends Religious Services: 1 to 4 times per year    Active Member of [employer] or Organizations: Yes    Attends Archivist Meetings: 1 to 4 times per year    Marital Status: Married    Tobacco Counseling Counseling given: Not Answered   Clinical Intake:  Pre-visit preparation completed: Yes  Pain : No/denies pain  Diabetes: No  How often do you need to have someone help you when you read instructions, pamphlets, or other written materials from your doctor or pharmacy?: 2 - Rarely   Activities of Daily Living    06/28/2022   11:13 AM  In your present state of health, do you have any difficulty performing the following activities:  Hearing? 1  Vision? 1  Difficulty concentrating or making decisions? 1  Walking or climbing stairs? 1  Dressing or bathing? 0  Doing errands, shopping? 0  Preparing Food and eating ? N  Using the Toilet? N  In the past six months, have you accidently leaked urine? Y  Do you have problems with loss of bowel control? N  Managing your Medications? N  Managing your Finances? N  Housekeeping or managing your Housekeeping? Y    Patient Care Team: Mosie Lukes, MD as PCP - General (Family  Medicine) Earnie Larsson, MD as Consulting Physician (Neurosurgery)  Indicate any recent Medical Services you may have received from other than [facility] providers in the past year (date may be approximate).     Assessment:   This is a routine wellness examination for Leandros.  Hearing/Vision screen No results found.  Dietary issues and exercise activities discussed: Current Exercise Habits: The patient does not participate in regular exercise at present, Exercise limited by: orthopedic condition(s)   Goals Addressed   None    Depression Screen    06/28/2022    3:07 PM 04/15/2022    9:13 AM 10/09/2021    2:46 PM 02/09/2021    1:13 PM 04/18/2020   10:00 AM 01/07/2020    8:14 AM 12/23/2016    2:53 PM  PHQ 2/9 Scores  PHQ - 2 Score 0 0 4 0 2 0 0  PHQ- 9 Score  0 17  12 0     Fall Risk    06/28/2022   11:13 AM 04/15/2022    9:13 AM 10/09/2021    2:46 PM 02/09/2021    1:12 PM 04/18/2020   10:00 AM  Fall Risk   Falls in the past year? 1 0 0 0 1  Number falls in past yr: 0 0 0  1  Injury with Fall? 0 0 0  1  Comment     broke rib  Risk for fall due to : History of fall(s)  No Fall Risks    Follow up Falls evaluation completed Falls evaluation completed Falls evaluation completed      [place]:  Any stairs in or around the home? Yes  If so, are there any without handrails? No  Home free of loose throw rugs in  walkways, pet beds, electrical cords, etc? Yes  Adequate lighting in your home to reduce risk of falls? Yes   ASSISTIVE DEVICES UTILIZED TO PREVENT FALLS:  Life alert? No  Use of a cane, walker or w/c? No  Grab bars in the bathroom? No  Shower chair or bench in shower? No  Elevated toilet seat or a handicapped toilet? No   TIMED UP AND GO:  Was the test performed?  No, audio visit .   Cognitive Function:        06/28/2022    3:10 PM  6CIT Screen  What Year? 0 points  What month? 0 points  What time? 0 points  Count back from 20  0 points  Months in reverse 2 points  Repeat phrase 0 points  Total Score 2 points    Immunizations Immunization History  Administered Date(s) Administered   Influenza-Unspecified 01/27/2018, 07/08/2018   Janssen (J&J) SARS-COV-2 Vaccination 07/09/2019, 04/01/2020   Tdap 04/29/2010    TDAP status: Due, Education has been provided regarding the importance of this vaccine. Advised may receive this vaccine at local pharmacy or Health Dept. Aware to provide a copy of the vaccination record if obtained from local pharmacy or Health Dept. Verbalized acceptance and understanding.  Flu Vaccine status: Due, Education has been provided regarding the importance of this vaccine. Advised may receive this vaccine at local pharmacy or Health Dept. Aware to provide a copy of the vaccination record if obtained from local pharmacy or Health Dept. Verbalized acceptance and understanding.  Pneumococcal vaccine status: Due, Education has been provided regarding the importance of this vaccine. Advised may receive this vaccine at local pharmacy or Health Dept. Aware to provide a copy of the vaccination record if obtained from local pharmacy or Health Dept. Verbalized acceptance and understanding.  Covid-19 vaccine status: Information provided on how to obtain vaccines.   Qualifies for Shingles Vaccine? Yes   Zostavax completed No   Shingrix Completed?: No.    Education has been provided regarding the importance of this vaccine. Patient has been advised to call insurance company to determine out of pocket expense if they have not yet received this vaccine. Advised may also receive vaccine at local pharmacy or Health Dept. Verbalized acceptance and understanding.  Screening Tests Health Maintenance  Topic Date Due   Medicare Annual Wellness (AWV)  Never done   Zoster Vaccines- Shingrix (1 of 2) Never done   DTaP/Tdap/Td (2 - Td or Tdap) 04/29/2020   COVID-19 Vaccine (3 - 2023-24 season) 12/28/2021    INFLUENZA VACCINE  07/28/2022 (Originally 11/27/2021)   COLONOSCOPY (Pts 45-53yr Insurance coverage will need to be confirmed)  02/12/2027   Hepatitis C Screening  Completed   HIV Screening  Completed   HPV VACCINES  Aged Out    Health Maintenance  Health Maintenance Due  Topic Date Due   Medicare Annual Wellness (AWV)  Never done   Zoster Vaccines- Shingrix (1 of 2) Never done   DTaP/Tdap/Td (2 - Td or Tdap) 04/29/2020   COVID-19 Vaccine (3 - 2023-24 season) 12/28/2021    Colorectal cancer screening: Type of screening: Colonoscopy. Completed 02/11/22. Repeat every 5 years  Lung Cancer Screening: (Low Dose CT Chest recommended if Age 59-80years, 30 pack-year currently smoking OR have quit w/in 15years.) does not qualify.   Additional Screening:  Hepatitis C Screening: does qualify; Completed 04/29/16  Vision Screening: Recommended annual ophthalmology exams for early detection of glaucoma and other disorders of the eye. Is the patient up  to date with their annual eye exam?  No  Who is the provider or what is the name of the office in which the patient attends annual eye exams? Dr. Teola Bradley If pt is not established with a provider, would they like to be referred to a provider to establish care? No .   Dental Screening: Recommended annual dental exams for proper oral hygiene  Community Resource Referral / Chronic Care Management: CRR required this visit?  No   CCM required this visit?  No      Plan:     I have personally reviewed and noted the following in the patient's chart:   Medical and social history Use of alcohol, tobacco or illicit drugs  Current medications and supplements including opioid prescriptions. Patient is not currently taking opioid prescriptions. Functional ability and status Nutritional status Physical activity Advanced directives List of other physicians Hospitalizations, surgeries, and ER visits in previous 12 months Vitals Screenings to  include cognitive, depression, and falls Referrals and appointments  In addition, I have reviewed and discussed with patient certain preventive protocols, quality metrics, and best practice recommendations. A written personalized care plan for preventive services as well as general preventive health recommendations were provided to patient.   Due to this being a telephonic visit, the after visit summary with patients personalized plan was offered to patient via mail or my-chart. Patient would like to access on my-chart.  Beatris Ship, [place]   06/28/2022   Nurse Notes: None

## 2022-06-28 NOTE — Patient Instructions (Signed)
[name] , Thank you for taking time to come for your Medicare Wellness Visit. I appreciate your ongoing commitment to your health goals. Please review the following plan we discussed and let me know if I can assist you in the future.   These are the goals we discussed:  Goals   None     This is a list of the screening recommended for you and due dates:  Health Maintenance  Topic Date Due   Zoster (Shingles) Vaccine (1 of 2) Never done   DTaP/Tdap/Td vaccine (2 - Td or Tdap) 04/29/2020   COVID-19 Vaccine (3 - 2023-24 season) 12/28/2021   Flu Shot  07/28/2022*   Medicare Annual Wellness Visit  06/28/2023   Colon Cancer Screening  02/12/2027   Hepatitis C Screening: USPSTF Recommendation to screen - Ages 18-79 yo.  Completed   HIV Screening  Completed   HPV Vaccine  Aged Out  *Topic was postponed. The date shown is not the original due date.     Next appointment: Follow up in one year for your annual wellness visit.  Preventive Care 40-64 Years, Male Preventive care refers to lifestyle choices and visits with your health care provider that can promote health and wellness. What does preventive care include? A yearly physical exam. This is also called an annual well check. Dental exams once or twice a year. Routine eye exams. Ask your health care provider how often you should have your eyes checked. Personal lifestyle choices, including: Daily care of your teeth and gums. Regular physical activity. Eating a healthy diet. Avoiding tobacco and drug use. Limiting alcohol use. Practicing safe sex. Taking low-dose aspirin every day starting at age 33. What happens during an annual well check? The services and screenings done by your health care provider during your annual well check will depend on your age, overall health, lifestyle risk factors, and family history of disease. Counseling  Your health care provider may ask you questions about your: Alcohol use. Tobacco  use. Drug use. Emotional well-being. Home and relationship well-being. Sexual activity. Eating habits. Work and work Statistician. Screening  You may have the following tests or measurements: Height, weight, and BMI. Blood pressure. Lipid and cholesterol levels. These may be checked every 5 years, or more frequently if you are over 53 years old. Skin check. Lung cancer screening. You may have this screening every year starting at age 75 if you have a 30-pack-year history of smoking and currently smoke or have quit within the past 15 years. Fecal occult blood test (FOBT) of the stool. You may have this test every year starting at age 5. Flexible sigmoidoscopy or colonoscopy. You may have a sigmoidoscopy every 5 years or a colonoscopy every 10 years starting at age 20. Prostate cancer screening. Recommendations will vary depending on your family history and other risks. Hepatitis C blood test. Hepatitis B blood test. Sexually transmitted disease (STD) testing. Diabetes screening. This is done by checking your blood sugar (glucose) after you have not eaten for a while (fasting). You may have this done every 1-3 years. Discuss your test results, treatment options, and if necessary, the need for more tests with your health care provider. Vaccines  Your health care provider may recommend certain vaccines, such as: Influenza vaccine. This is recommended every year. Tetanus, diphtheria, and acellular pertussis (Tdap, Td) vaccine. You may need a Td booster every 10 years. Zoster vaccine. You may need this after age 59. Pneumococcal 13-valent conjugate (PCV13) vaccine. You may need  this if you have certain conditions and have not been vaccinated. Pneumococcal polysaccharide (PPSV23) vaccine. You may need one or two doses if you smoke cigarettes or if you have certain conditions. Talk to your health care provider about which screenings and vaccines you need and how often you need them. This  information is not intended to replace advice given to you by your health care provider. Make sure you discuss any questions you have with your health care provider. Document Released: 05/12/2015 Document Revised: 01/03/2016 Document Reviewed: 02/14/2015 Elsevier Interactive Patient Education  2017 [place] Prevention in the Home Falls can cause injuries. They can happen to people of all ages. There are many things you can do to make your home safe and to help prevent falls. What can I do on the outside of my home? Regularly fix the edges of walkways and driveways and fix any cracks. Remove anything that might make you trip as you walk through a door, such as a raised step or threshold. Trim any bushes or trees on the path to your home. Use bright outdoor lighting. Clear any walking paths of anything that might make someone trip, such as rocks or tools. Regularly check to see if handrails are loose or broken. Make sure that both sides of any steps have handrails. Any raised decks and porches should have guardrails on the edges. Have any leaves, snow, or ice cleared regularly. Use sand or salt on walking paths during winter. Clean up any spills in your garage right away. This includes oil or grease spills. What can I do in the bathroom? Use night lights. Install grab bars by the toilet and in the tub and shower. Do not use towel bars as grab bars. Use non-skid mats or decals in the tub or shower. If you need to sit down in the shower, use a plastic, non-slip stool. Keep the floor dry. Clean up any water that spills on the floor as soon as it happens. Remove soap buildup in the tub or shower regularly. Attach bath mats securely with double-sided non-slip rug tape. Do not have throw rugs and other things on the floor that can make you trip. What can I do in the bedroom? Use night lights. Make sure that you have a light by your bed that is easy to reach. Do not use any sheets or  blankets that are too big for your bed. They should not hang down onto the floor. Have a firm chair that has side arms. You can use this for support while you get dressed. Do not have throw rugs and other things on the floor that can make you trip. What can I do in the kitchen? Clean up any spills right away. Avoid walking on wet floors. Keep items that you use a lot in easy-to-reach places. If you need to reach something above you, use a strong step stool that has a grab bar. Keep electrical cords out of the way. Do not use floor polish or wax that makes floors slippery. If you must use wax, use non-skid floor wax. Do not have throw rugs and other things on the floor that can make you trip. What can I do with my stairs? Do not leave any items on the stairs. Make sure that there are handrails on both sides of the stairs and use them. Fix handrails that are broken or loose. Make sure that handrails are as long as the stairways. Check any carpeting to make sure that  it is firmly attached to the stairs. Fix any carpet that is loose or worn. Avoid having throw rugs at the top or bottom of the stairs. If you do have throw rugs, attach them to the floor with carpet tape. Make sure that you have a light switch at the top of the stairs and the bottom of the stairs. If you do not have them, ask someone to add them for you. What else can I do to help prevent falls? Wear shoes that: Do not have high heels. Have rubber bottoms. Are comfortable and fit you well. Are closed at the toe. Do not wear sandals. If you use a stepladder: Make sure that it is fully opened. Do not climb a closed stepladder. Make sure that both sides of the stepladder are locked into place. Ask someone to hold it for you, if possible. Clearly mark and make sure that you can see: Any grab bars or handrails. First and last steps. Where the edge of each step is. Use tools that help you move around (mobility aids) if they are  needed. These include: Canes. Walkers. Scooters. Crutches. Turn on the lights when you go into a dark area. Replace any light bulbs as soon as they burn out. Set up your furniture so you have a clear path. Avoid moving your furniture around. If any of your floors are uneven, fix them. If there are any pets around you, be aware of where they are. Review your medicines with your doctor. Some medicines can make you feel dizzy. This can increase your chance of falling. Ask your doctor what other things that you can do to help prevent falls. This information is not intended to replace advice given to you by your health care provider. Make sure you discuss any questions you have with your health care provider. Document Released: 02/09/2009 Document Revised: 09/21/2015 Document Reviewed: 05/20/2014 Elsevier Interactive Patient Education  2017 [employer].

## 2022-07-09 ENCOUNTER — Other Ambulatory Visit: Payer: Self-pay | Admitting: Family Medicine

## 2022-07-10 ENCOUNTER — Encounter: Payer: Self-pay | Admitting: Family Medicine

## 2022-07-15 DIAGNOSIS — Z803 Family history of malignant neoplasm of breast: Secondary | ICD-10-CM | POA: Diagnosis not present

## 2022-07-15 DIAGNOSIS — R69 Illness, unspecified: Secondary | ICD-10-CM | POA: Diagnosis not present

## 2022-07-15 DIAGNOSIS — F901 Attention-deficit hyperactivity disorder, predominantly hyperactive type: Secondary | ICD-10-CM | POA: Diagnosis not present

## 2022-07-15 DIAGNOSIS — E669 Obesity, unspecified: Secondary | ICD-10-CM | POA: Diagnosis not present

## 2022-07-15 DIAGNOSIS — Z801 Family history of malignant neoplasm of trachea, bronchus and lung: Secondary | ICD-10-CM | POA: Diagnosis not present

## 2022-07-15 DIAGNOSIS — N529 Male erectile dysfunction, unspecified: Secondary | ICD-10-CM | POA: Diagnosis not present

## 2022-07-15 DIAGNOSIS — Z6834 Body mass index (BMI) 34.0-34.9, adult: Secondary | ICD-10-CM | POA: Diagnosis not present

## 2022-07-15 DIAGNOSIS — Z800 Family history of malignant neoplasm of digestive organs: Secondary | ICD-10-CM | POA: Diagnosis not present

## 2022-07-15 DIAGNOSIS — R030 Elevated blood-pressure reading, without diagnosis of hypertension: Secondary | ICD-10-CM | POA: Diagnosis not present

## 2022-07-15 DIAGNOSIS — F32A Depression, unspecified: Secondary | ICD-10-CM | POA: Diagnosis not present

## 2022-07-15 DIAGNOSIS — Z818 Family history of other mental and behavioral disorders: Secondary | ICD-10-CM | POA: Diagnosis not present

## 2022-07-15 DIAGNOSIS — M1990 Unspecified osteoarthritis, unspecified site: Secondary | ICD-10-CM | POA: Diagnosis not present

## 2022-07-15 DIAGNOSIS — F419 Anxiety disorder, unspecified: Secondary | ICD-10-CM | POA: Diagnosis not present

## 2022-07-15 MED ORDER — RYBELSUS 7 MG PO TABS: 7.0000 mg | ORAL_TABLET | Freq: Every day | ORAL | 0 refills | Status: AC

## 2022-07-15 MED ORDER — RYBELSUS 3 MG PO TABS: 3.0000 mg | ORAL_TABLET | Freq: Every day | ORAL | 0 refills | Status: AC

## 2022-07-17 ENCOUNTER — Other Ambulatory Visit: Payer: Self-pay | Admitting: Family Medicine

## 2022-08-08 ENCOUNTER — Telehealth: Payer: Self-pay

## 2022-08-08 NOTE — Telephone Encounter (Signed)
ERROR

## 2022-08-08 NOTE — Telephone Encounter (Signed)
Pharmacy Patient Advocate Encounter  Received notification from [facility] that the request for prior authorization for Rybelsus has been denied due to .    Please be advised we currently do not have a Pharmacist to review denials, therefore you will need to process appeals accordingly as needed. Thanks for your support at this time.

## 2022-08-09 ENCOUNTER — Telehealth: Payer: Self-pay

## 2022-08-09 NOTE — Telephone Encounter (Signed)
Called pt lvm I have called his insurance spoke with  [facility] @ [place] and she stated the pt will need to let us know the number for medication plan he has for [place] to call. Told pt to call us back let us know the number so we can  Spoke with his insurance.

## 2022-08-09 NOTE — Telephone Encounter (Signed)
This [place] denial has been documented in separate encounter. Pt has been denied for Ozempic due to pt not having diabetes.

## 2022-08-09 NOTE — Telephone Encounter (Signed)
[employer] for the patient @ [place] his insurance stated pt need to call and give [place] the number we need for his medications plan For [place] @ Ozempic approved for him. Called pt lvm to give [place] a call back, his insurance wanted him to give [place] number to for medications plan so we can help get [place] started.

## 2022-08-14 NOTE — Telephone Encounter (Signed)
Called pt spoke about weight loss  Medication insurance isn't paying, pt stated understand.

## 2022-09-05 ENCOUNTER — Encounter: Payer: Self-pay | Admitting: Family Medicine

## 2022-09-05 ENCOUNTER — Other Ambulatory Visit: Payer: Self-pay | Admitting: Family Medicine

## 2022-09-05 MED ORDER — AMITRIPTYLINE HCL 10 MG PO TABS: 10.0000 mg | ORAL_TABLET | Freq: Every evening | ORAL | 1 refills | Status: DC | PRN

## 2022-09-05 MED ORDER — AMPHETAMINE-DEXTROAMPHET ER 20 MG PO CP24: 20.0000 mg | ORAL_CAPSULE | ORAL | 0 refills | Status: DC

## 2022-09-05 NOTE — Telephone Encounter (Signed)
Requesting: Adderall xr 20mg   Contract: None UDS: None Last Visit: 04/15/22 Next Visit: None Last Refill: 05/08/22 #30 and 0RF   Please Advise

## 2022-09-05 NOTE — Telephone Encounter (Signed)
Are you okay with sending this in?

## 2022-09-28 ENCOUNTER — Other Ambulatory Visit: Payer: Self-pay | Admitting: Family Medicine

## 2022-10-05 ENCOUNTER — Other Ambulatory Visit: Payer: Self-pay | Admitting: Family Medicine

## 2022-11-01 DIAGNOSIS — M25511 Pain in right shoulder: Secondary | ICD-10-CM | POA: Diagnosis not present

## 2022-11-01 DIAGNOSIS — S40219A Abrasion of unspecified shoulder, initial encounter: Secondary | ICD-10-CM | POA: Diagnosis not present

## 2022-11-01 DIAGNOSIS — S46911A Strain of unspecified muscle, fascia and tendon at shoulder and upper arm level, right arm, initial encounter: Secondary | ICD-10-CM | POA: Diagnosis not present

## 2023-01-24 ENCOUNTER — Other Ambulatory Visit: Payer: Self-pay | Admitting: Family Medicine

## 2023-01-27 ENCOUNTER — Other Ambulatory Visit: Payer: Self-pay | Admitting: Family Medicine

## 2023-01-27 ENCOUNTER — Encounter: Payer: Self-pay | Admitting: Family Medicine

## 2023-01-27 MED ORDER — LEVOTHYROXINE SODIUM 25 MCG PO TABS: 25.0000 ug | ORAL_TABLET | Freq: Every day | ORAL | 0 refills | Status: DC

## 2023-04-07 ENCOUNTER — Encounter: Payer: Self-pay | Admitting: Family Medicine

## 2023-12-15 DIAGNOSIS — M5104 Intervertebral disc disorders with myelopathy, thoracic region: Secondary | ICD-10-CM | POA: Diagnosis not present

## 2023-12-15 DIAGNOSIS — Z6833 Body mass index (BMI) 33.0-33.9, adult: Secondary | ICD-10-CM | POA: Diagnosis not present

## 2023-12-15 DIAGNOSIS — E785 Hyperlipidemia, unspecified: Secondary | ICD-10-CM | POA: Diagnosis not present

## 2023-12-15 DIAGNOSIS — Z91148 Patient's other noncompliance with medication regimen for other reason: Secondary | ICD-10-CM | POA: Diagnosis not present

## 2023-12-15 DIAGNOSIS — E669 Obesity, unspecified: Secondary | ICD-10-CM | POA: Diagnosis not present

## 2023-12-15 DIAGNOSIS — Z008 Encounter for other general examination: Secondary | ICD-10-CM | POA: Diagnosis not present

## 2023-12-15 DIAGNOSIS — E039 Hypothyroidism, unspecified: Secondary | ICD-10-CM | POA: Diagnosis not present

## 2024-01-01 ENCOUNTER — Encounter: Payer: Self-pay | Admitting: Family Medicine

## 2024-01-02 ENCOUNTER — Other Ambulatory Visit: Payer: Self-pay | Admitting: Family

## 2024-01-02 MED ORDER — SILDENAFIL CITRATE 50 MG PO TABS: 50.0000 mg | ORAL_TABLET | Freq: Every day | ORAL | 2 refills | Status: AC | PRN

## 2024-01-05 ENCOUNTER — Other Ambulatory Visit: Payer: Self-pay | Admitting: Medical Genetics

## 2024-01-11 NOTE — Assessment & Plan Note (Signed)
On Levothyroxine, continue to monitor. Patient acknowledges he has not been taking 

## 2024-01-11 NOTE — Assessment & Plan Note (Signed)
 Encourage heart healthy diet such as MIND or DASH diet, increase exercise, avoid trans fats, simple carbohydrates and processed foods, consider a krill or fish or flaxseed oil cap daily.

## 2024-01-12 ENCOUNTER — Ambulatory Visit: Admitting: Family Medicine

## 2024-01-12 ENCOUNTER — Encounter: Payer: Self-pay | Admitting: Family Medicine

## 2024-01-12 ENCOUNTER — Ambulatory Visit: Payer: Self-pay | Admitting: Family Medicine

## 2024-01-12 VITALS — BP 136/88 | HR 57 | Resp 16 | Ht 70.0 in | Wt 250.8 lb

## 2024-01-12 DIAGNOSIS — R351 Nocturia: Secondary | ICD-10-CM | POA: Diagnosis not present

## 2024-01-12 DIAGNOSIS — Z23 Encounter for immunization: Secondary | ICD-10-CM | POA: Diagnosis not present

## 2024-01-12 DIAGNOSIS — E039 Hypothyroidism, unspecified: Secondary | ICD-10-CM

## 2024-01-12 DIAGNOSIS — E785 Hyperlipidemia, unspecified: Secondary | ICD-10-CM

## 2024-01-12 DIAGNOSIS — S8992XA Unspecified injury of left lower leg, initial encounter: Secondary | ICD-10-CM

## 2024-01-12 LAB — CBC WITH DIFFERENTIAL/PLATELET
Basophils Absolute: 0 K/uL (ref 0.0–0.1)
Basophils Relative: 0.6 % (ref 0.0–3.0)
Eosinophils Absolute: 0.2 K/uL (ref 0.0–0.7)
Eosinophils Relative: 3.6 % (ref 0.0–5.0)
HCT: 46.9 % (ref 39.0–52.0)
Hemoglobin: 15.8 g/dL (ref 13.0–17.0)
Lymphocytes Relative: 21.1 % (ref 12.0–46.0)
Lymphs Abs: 1.3 K/uL (ref 0.7–4.0)
MCHC: 33.7 g/dL (ref 30.0–36.0)
MCV: 91.1 fl (ref 78.0–100.0)
Monocytes Absolute: 0.6 K/uL (ref 0.1–1.0)
Monocytes Relative: 9.7 % (ref 3.0–12.0)
Neutro Abs: 4 K/uL (ref 1.4–7.7)
Neutrophils Relative %: 65 % (ref 43.0–77.0)
Platelets: 230 K/uL (ref 150.0–400.0)
RBC: 5.14 Mil/uL (ref 4.22–5.81)
RDW: 13.6 % (ref 11.5–15.5)
WBC: 6.1 K/uL (ref 4.0–10.5)

## 2024-01-12 LAB — COMPREHENSIVE METABOLIC PANEL WITH GFR
ALT: 26 U/L (ref 0–53)
AST: 19 U/L (ref 0–37)
Albumin: 4.8 g/dL (ref 3.5–5.2)
Alkaline Phosphatase: 69 U/L (ref 39–117)
BUN: 17 mg/dL (ref 6–23)
CO2: 26 meq/L (ref 19–32)
Calcium: 9.4 mg/dL (ref 8.4–10.5)
Chloride: 103 meq/L (ref 96–112)
Creatinine, Ser: 0.81 mg/dL (ref 0.40–1.50)
GFR: 96.19 mL/min (ref >60.00)
Glucose, Bld: 97 mg/dL (ref 70–99)
Potassium: 4.1 meq/L (ref 3.5–5.1)
Sodium: 137 meq/L (ref 135–145)
Total Bilirubin: 1 mg/dL (ref 0.2–1.2)
Total Protein: 7.3 g/dL (ref 6.0–8.3)

## 2024-01-12 LAB — LIPID PANEL
Cholesterol: 238 mg/dL — ABNORMAL HIGH (ref 0–200)
HDL: 53.5 mg/dL (ref >39.00)
LDL Cholesterol: 157 mg/dL — ABNORMAL HIGH (ref 0–99)
NonHDL: 184.71
Total CHOL/HDL Ratio: 4
Triglycerides: 137 mg/dL (ref 0.0–149.0)
VLDL: 27.4 mg/dL (ref 0.0–40.0)

## 2024-01-12 LAB — PSA: PSA: 0.69 ng/mL (ref 0.10–4.00)

## 2024-01-12 LAB — T4, FREE: Free T4: 0.69 ng/dL (ref 0.60–1.60)

## 2024-01-12 LAB — TSH: TSH: 4.7 u[IU]/mL (ref 0.35–5.50)

## 2024-01-12 NOTE — Progress Notes (Signed)
 Subjective:    Patient ID: Ronald Griffin, male    DOB: [birth year], 58 y.o.   MRN: [id number]  Chief Complaint  Patient presents with   Medical Management of Chronic Issues    Patient presents today for a follow-up.   Quality Metric Gaps    AWV, TDAP, zoster, pneumococcal, Hep B vaccines    HPI Discussed the use of AI scribe software for clinical note transcription with the patient, who gave verbal consent to proceed.  History of Present Illness Ronald Griffin is a 58 year old male with neuropathy who presents with a follow-up on neuropathy management and recent fall.  He has ongoing neuropathy primarily affecting his feet. He has been receiving treatment at a chirop[facility] clinic called [facility], where a prick test and an electrode test were conducted. He felt the electrode sensation at a level of eight, whereas a typical response would be at a level of three. He uses a TENS unit and EMS therapy, which involves placing his feet in water with electrodes to increase blood flow and nerve regeneration. He has noticed some improvement, with the sensation level decreasing from seven or eight to six. He also uses red light therapy on his feet, calves, and back.  He experienced a fall yesterday and had discomfort in his right knee. He iced the knee and reports it feels better today. He continues to have balance issues but is not experiencing frequent falls.  He has a history of sciatica, which is exacerbated by using a vibration board intended to improve balance. He has tried various treatments, including tizanidine  and amitriptyline , to manage symptoms and aid sleep.  He has not taken his thyroid  medication recently and is due for blood work to assess his thyroid  function. No chest pain, constipation, or diarrhea recently.  He has a history of a hernia, which is not currently symptomatic, and a past bicep injury that causes cramping during arm exercises.  He sustained a gash on his left  anterior lower leg about a week ago, which is healing and not showing signs of infection. He has been cleaning it with soap and water and applying triple antibiotic ointment.    Past Medical History:  Diagnosis Date   Depression 2019   Dizziness    Elevated antinuclear antibody (ANA) level 11/21/2014   Erectile dysfunction 11/21/2014   Frequent headaches    Hand pain, right 02/16/2017   Headache 03/29/2014   History of colonic polyps 11/21/2014   Hyperlipidemia    Knee pain, right 11/21/2014   Low back pain 04/03/2015   Motion sickness 11/21/2014   Myelopathy (HCC)    thoracic   Neuromuscular disorder (HCC) 2019   Obesity 02/16/2017   Post-operative nausea and vomiting    Preventative health care 11/21/2014   Sleep apnea     Past Surgical History:  Procedure Laterality Date   COLONOSCOPY  2012   in [place]   POLYPECTOMY     SKIN BIOPSY     SPINE SURGERY  02/09/2018   T-7,8,9   THORACIC DISCECTOMY Left 02/09/2018   Procedure: Microdiscectomy - Left - Thoracic Seven-Thoracid Eight - Thoracic Eight - Thoracic Nine;  Surgeon: Louis Shove, MD;  Location: [facility] OR;  Service: Neurosurgery;  Laterality: Left;  Microdiscectomy - Left - Thoracic Seven-Thoracid Eight - Thoracic Eight - Thoracic Nine   VARICOCELE EXCISION     VASECTOMY      Family History  Problem Relation Age of Onset   Lung cancer Mother  Breast cancer Mother    Cancer Mother        breast age 54s, colon late 82s, lung, liver, skin   Colon cancer Mother 74   Bipolar disorder Father    Cancer Father 31       colon cancer   Dementia Father    Colon cancer Father 40   Depression Father    Obesity Brother    Lung cancer Paternal Uncle    Cancer Maternal Grandmother        ?   Heart disease Maternal Grandfather        MI, sudden   Stroke Paternal Grandmother 27   Bipolar disorder Paternal Grandfather    Dementia Paternal Grandfather    Cancer Paternal Grandfather        colon   Colon cancer Paternal  Grandfather    Thyroid  disease Daughter    Colon cancer Cousin 45   Esophageal cancer Neg Hx    Stomach cancer Neg Hx    Crohn's disease Neg Hx    Rectal cancer Neg Hx    Ulcerative colitis Neg Hx     Social History   Socioeconomic History   Marital status: Married    Spouse name: Berwyn   Number of children: 2   Years of education: college   Highest education level: Associate degree: academic program  Occupational History   Not on file  Tobacco Use   Smoking status: Never    Passive exposure: Past (YEARS AGO,MOTHER USE TO SMOKE)   Smokeless tobacco: Never  Vaping Use   Vaping status: Never Used  Substance and Sexual Activity   Alcohol use: Yes    Alcohol/week: 3.0 - 5.0 standard drinks of alcohol    Types: 3 - 5 Standard drinks or equivalent per week    Comment: weekly 3-5 times per pt   Drug use: No   Sexual activity: Yes    Comment: lives with wife, works in [employer], part time at [employer], no dietary restrictions, exercises regularly  Other Topics Concern   Not on file  Social History Narrative   Patient works full time for [employer]. Patient lives at home with his wife Marceil).   Education college.   Right handed.   Caffeine  4-6 cups daily.    Social Drivers of Health   Financial Resource Strain: Medium Risk (01/11/2024)   Overall Financial Resource Strain (CARDIA)    Difficulty of Paying Living Expenses: Somewhat hard  Food Insecurity: No Food Insecurity (01/11/2024)   Hunger Vital Sign    Worried About Running Out of Food in the Last Year: Never true    Ran Out of Food in the Last Year: Never true  Transportation Needs: No Transportation Needs (01/11/2024)   PRAPARE - Administrator, Civil Service (Medical): No    Lack of Transportation (Non-Medical): No  Physical Activity: Insufficiently Active (01/11/2024)   Exercise Vital Sign    Days of Exercise per Week: 2 days    Minutes of Exercise per Session: 20 min   Stress: Stress Concern Present (01/11/2024)   [employer] of Occupational Health - Occupational Stress Questionnaire    Feeling of Stress: To some extent  Social Connections: Moderately Isolated (01/11/2024)   Social Connection and Isolation Panel    Frequency of Communication with Friends and Family: Three times a week    Frequency of Social Gatherings with Friends and Family: Once a week    Attends Religious Services: Never  Active Member of Clubs or Organizations: No    Attends Banker Meetings: Not on file    Marital Status: Married  Intimate Partner Violence: Not At Risk (06/28/2022)   Humiliation, Afraid, Rape, and Kick questionnaire    Fear of Current or Ex-Partner: No    Emotionally Abused: No    Physically Abused: No    Sexually Abused: No    Outpatient Medications Prior to Visit  Medication Sig Dispense Refill   amitriptyline  (ELAVIL ) 10 MG tablet TAKE 1 TABLET BY MOUTH AT BEDTIME AS NEEDED FOR SLEEP. 90 tablet 1   amphetamine -dextroamphetamine (ADDERALL XR) 20 MG 24 hr capsule Take 1 capsule (20 mg total) by mouth every morning. May 2024 30 capsule 0   Azelaic Acid  15 % gel Apply 1 Dose topically 2 (two) times daily. After skin is thoroughly washed and patted dry, gently but thoroughly massage a thin film of azelaic acid  cream into the affected area twice daily, in the morning and evening. 50 g 3   levothyroxine  (SYNTHROID ) 25 MCG tablet Take 1 tablet (25 mcg total) by mouth daily before breakfast. 30 tablet 0   OVER THE COUNTER MEDICATION daily. [facility] TAKE ONE DAILY     Semaglutide  (RYBELSUS ) 3 MG TABS Take 1 tablet (3 mg total) by mouth daily. 30 tablet 0   Semaglutide  (RYBELSUS ) 7 MG TABS Take 1 tablet (7 mg total) by mouth daily. 30 tablet 0   sildenafil  (VIAGRA ) 50 MG tablet Take 1-2 tablets (50-100 mg total) by mouth daily as needed for erectile dysfunction. 20 tablet 2   tiZANidine  (ZANAFLEX ) 4 MG capsule Take 1 capsule (4 mg total) by  mouth 3 (three) times daily as needed for muscle spasms. 30 capsule 1   No facility-administered medications prior to visit.    No Known Allergies  Review of Systems  Constitutional:  Negative for fever and malaise/fatigue.  HENT:  Negative for congestion.   Eyes:  Negative for blurred vision.  Respiratory:  Negative for shortness of breath.   Cardiovascular:  Negative for chest pain, palpitations and leg swelling.  Gastrointestinal:  Negative for abdominal pain, blood in stool and nausea.  Genitourinary:  Negative for dysuria and frequency.  Musculoskeletal:  Positive for back pain, falls and myalgias.  Skin:  Negative for rash.  Neurological:  Positive for dizziness, tingling and sensory change. Negative for loss of consciousness and headaches.  Endo/Heme/Allergies:  Negative for environmental allergies.  Psychiatric/Behavioral:  Negative for depression. The patient is not nervous/anxious.        Objective:    Physical Exam Vitals reviewed.  Constitutional:      Appearance: Normal appearance. He is not ill-appearing.  HENT:     Head: Normocephalic and atraumatic.     Nose: Nose normal.  Eyes:     Conjunctiva/sclera: Conjunctivae normal.  Cardiovascular:     Rate and Rhythm: Normal rate.     Pulses: Normal pulses.     Heart sounds: Normal heart sounds. No murmur heard. Pulmonary:     Effort: Pulmonary effort is normal.     Breath sounds: Normal breath sounds. No wheezing.  Abdominal:     Palpations: Abdomen is soft. There is no mass.     Tenderness: There is no abdominal tenderness.  Musculoskeletal:     Cervical back: Normal range of motion.     Right lower leg: No edema.     Left lower leg: No edema.  Skin:    General: Skin is warm  and dry.     Findings: Laceration present.      Neurological:     General: No focal deficit present.     Mental Status: He is alert and oriented to person, place, and time.  Psychiatric:        Mood and Affect: Mood normal.      BP 136/88   Pulse (!) 57   Resp 16   Ht 5' 10 (1.778 m)   Wt 250 lb 12.8 oz (113.8 kg)   SpO2 96%   BMI 35.99 kg/m  Wt Readings from Last 3 Encounters:  01/12/24 250 lb 12.8 oz (113.8 kg)  04/15/22 247 lb 6.4 oz (112.2 kg)  02/11/22 230 lb (104.3 kg)    Diabetic Foot Exam - Simple   No data filed    Lab Results  Component Value Date   WBC 5.6 04/15/2022   HGB 15.8 04/15/2022   HCT 46.7 04/15/2022   PLT 242.0 04/15/2022   GLUCOSE 99 04/15/2022   CHOL 223 (H) 04/15/2022   TRIG 148.0 04/15/2022   HDL 56.70 04/15/2022   LDLDIRECT 140.0 02/14/2017   LDLCALC 137 (H) 04/15/2022   ALT 20 04/15/2022   AST 17 04/15/2022   NA 138 04/15/2022   K 4.4 04/15/2022   CL 102 04/15/2022   CREATININE 1.02 04/15/2022   BUN 17 04/15/2022   CO2 25 04/15/2022   TSH 4.41 04/15/2022   PSA 0.61 11/09/2019   HGBA1C 5.6 04/03/2020    Lab Results  Component Value Date   TSH 4.41 04/15/2022   Lab Results  Component Value Date   WBC 5.6 04/15/2022   HGB 15.8 04/15/2022   HCT 46.7 04/15/2022   MCV 91.1 04/15/2022   PLT 242.0 04/15/2022   Lab Results  Component Value Date   NA 138 04/15/2022   K 4.4 04/15/2022   CO2 25 04/15/2022   GLUCOSE 99 04/15/2022   BUN 17 04/15/2022   CREATININE 1.02 04/15/2022   BILITOT 0.7 04/15/2022   ALKPHOS 72 04/15/2022   AST 17 04/15/2022   ALT 20 04/15/2022   PROT 7.2 04/15/2022   ALBUMIN 4.7 04/15/2022   CALCIUM  9.3 04/15/2022   ANIONGAP 7 02/09/2018   GFR 81.17 04/15/2022   Lab Results  Component Value Date   CHOL 223 (H) 04/15/2022   Lab Results  Component Value Date   HDL 56.70 04/15/2022   Lab Results  Component Value Date   LDLCALC 137 (H) 04/15/2022   Lab Results  Component Value Date   TRIG 148.0 04/15/2022   Lab Results  Component Value Date   CHOLHDL 4 04/15/2022   Lab Results  Component Value Date   HGBA1C 5.6 04/03/2020       Assessment & Plan:  Hyperlipidemia, unspecified hyperlipidemia  type Assessment & Plan: Encourage heart healthy diet such as MIND or DASH diet, increase exercise, avoid trans fats, simple carbohydrates and processed foods, consider a krill or fish or flaxseed oil cap daily.    Hypothyroidism, unspecified type Assessment & Plan: On Levothyroxine , continue to monitor. Patient acknowledges he has not been taking     Assessment and Plan Assessment & Plan Peripheral neuropathy and sciatica Chronic peripheral neuropathy with associated sciatica, primarily affecting the lower extremities. Reports ongoing issues with balance and occasional falls. Recent treatment includes chiropractic care and use of a TENS unit and EMS for nerve stimulation, with some improvement. Sciatica exacerbated by use of a vibration board. - Obtain records from [facility] for  further evaluation. - Continue use of TENS unit and EMS as per current regimen. - Consider use of tizanidine  before using the vibration board to prevent sciatica exacerbation.  Unspecified injury of left lower leg Recent gash on the left anterior lower leg from a metal locker, approximately one week old. No signs of infection, and the wound is healing. Overdue for tetanus vaccination. - Administer tetanus vaccine today. - Advise cleaning the wound with warm soap and water daily, followed by application of peroxide and Neosporin. - Leave the wound open to air when possible, but cover with a bandage when out in public.  Hypothyroidism Currently not taking levothyroxine . Thyroid  function to be assessed with upcoming blood work. - Order thyroid  function tests including free T4 and TSH.  Umbilical hernia without obstruction or gangrene Umbilical hernia present but not currently symptomatic. No protrusion noted during examination while seated. - Monitor for symptoms such as pain or increased protrusion. - Consider referral to a surgeon if symptoms develop or if he desires surgical consultation.  General  Health Maintenance Due for several vaccinations and screenings. Discussed the importance of vaccinations in preventing serious illnesses and potential complications. - Administer tetanus vaccine today. - Recommend shingles vaccine, available at pharmacy clinics. - Discussed the importance of flu and COVID vaccines, especially as he ages. - Consider hepatitis B titer if insurance covers it, given past vaccination history.  Recording duration: 42 minutes     Harlene Horton, MD

## 2024-01-12 NOTE — Patient Instructions (Addendum)
 Annual Covid and Flu vaccine Shingrix is the new shingles shot, 2 shots over 2-6 months, confirm coverage with insurance and document, then can return here for shots with nurse appt or at pharmacy    Hypothyroidism  Hypothyroidism is when the thyroid  gland does not make enough of certain hormones. This is called an underactive thyroid . The thyroid  gland is a small gland located in the lower front part of the neck, just in front of the windpipe (trachea). This gland makes hormones that help control how the body uses food for energy (metabolism) as well as how the heart and brain function. These hormones also play a role in keeping your bones strong. When the thyroid  is underactive, it produces too little of the hormones thyroxine (T4) and triiodothyronine (T3). What are the causes? This condition may be caused by: Hashimoto's disease. This is a disease in which the body's disease-fighting system (immune system) attacks the thyroid  gland. This is the most common cause. Viral infections. Pregnancy. Certain medicines. Birth defects. Problems with a gland in the center of the brain (pituitary gland). Lack of enough iodine in the diet. Other causes may include: Past radiation treatments to the head or neck for cancer. Past treatment with radioactive iodine. Past exposure to radiation in the environment. Past surgical removal of part or all of the thyroid . What increases the risk? You are more likely to develop this condition if: You are male. You have a family history of thyroid  conditions. You use a medicine called lithium. You take medicines that affect the immune system (immunosuppressants). What are the signs or symptoms? Common symptoms of this condition include: Not being able to tolerate cold. Feeling as though you have no energy (lethargy). Lack of appetite. Constipation. Sadness or depression. Weight gain that is not explained by a change in diet or exercise  habits. Menstrual irregularity. Dry skin, coarse hair, or brittle nails. Other symptoms may include: Muscle pain. Slowing of thought processes. Poor memory. How is this diagnosed? This condition may be diagnosed based on: Your symptoms, your medical history, and a physical exam. Blood tests. You may also have imaging tests, such as an ultrasound or MRI. How is this treated? This condition is treated with medicine that replaces the thyroid  hormones that your body does not make. After you begin treatment, it may take several weeks for symptoms to go away. Follow these instructions at home: Take over-the-counter and prescription medicines only as told by your health care provider. If you start taking any new medicines, tell your health care provider. Keep all follow-up visits as told by your health care provider. This is important. As your condition improves, your dosage of thyroid  hormone medicine may change. You will need to have blood tests regularly so that your health care provider can monitor your condition. Contact a health care provider if: Your symptoms do not get better with treatment. You are taking thyroid  hormone replacement medicine and you: Sweat a lot. Have tremors. Feel anxious. Lose weight rapidly. Cannot tolerate heat. Have emotional swings. Have diarrhea. Feel weak. Get help right away if: You have chest pain. You have an irregular heartbeat. You have a rapid heartbeat. You have difficulty breathing. These symptoms may be an emergency. Get help right away. Call 911. Do not wait to see if the symptoms will go away. Do not drive yourself to the hospital. Summary Hypothyroidism is when the thyroid  gland does not make enough of certain hormones (it is underactive). When the thyroid  is underactive, it produces  too little of the hormones thyroxine (T4) and triiodothyronine (T3). The most common cause is Hashimoto's disease, a disease in which the body's  disease-fighting system (immune system) attacks the thyroid  gland. The condition can also be caused by viral infections, medicine, pregnancy, or past radiation treatment to the head or neck. Symptoms may include weight gain, dry skin, constipation, feeling as though you do not have energy, and not being able to tolerate cold. This condition is treated with medicine to replace the thyroid  hormones that your body does not make. This information is not intended to replace advice given to you by your health care provider. Make sure you discuss any questions you have with your health care provider. Document Revised: 04/17/2021 Document Reviewed: 04/17/2021 Elsevier Patient Education  2024 [employer].

## 2024-01-13 ENCOUNTER — Encounter: Payer: Self-pay | Admitting: Family Medicine

## 2024-01-13 ENCOUNTER — Other Ambulatory Visit: Payer: Self-pay | Admitting: Family Medicine

## 2024-01-13 MED ORDER — LEVOTHYROXINE SODIUM 25 MCG PO TABS: 25.0000 ug | ORAL_TABLET | Freq: Every day | ORAL | 0 refills | Status: DC

## 2024-01-14 ENCOUNTER — Other Ambulatory Visit: Payer: Self-pay | Admitting: Family

## 2024-01-14 MED ORDER — AMPHETAMINE-DEXTROAMPHET ER 20 MG PO CP24: 20.0000 mg | ORAL_CAPSULE | ORAL | 0 refills | Status: DC

## 2024-01-14 NOTE — Telephone Encounter (Signed)
 Requesting: adderall Contract:09/2021 UDS:09/2021 Last Visit:01/12/24 Next Visit:3/3/0/26 Last Refill:08/2022  Please Advise

## 2024-02-12 ENCOUNTER — Other Ambulatory Visit: Payer: Self-pay | Admitting: Family Medicine

## 2024-05-17 ENCOUNTER — Encounter: Payer: Self-pay | Admitting: Family Medicine

## 2024-05-17 ENCOUNTER — Other Ambulatory Visit: Payer: Self-pay

## 2024-05-17 MED ORDER — AMPHETAMINE-DEXTROAMPHET ER 20 MG PO CP24: 20.0000 mg | ORAL_CAPSULE | ORAL | 0 refills | Status: AC

## 2024-05-17 NOTE — Telephone Encounter (Signed)
 Requesting: 01/14/2024 Contract: 01/12/2024 UDS: 10/09/2021 Last Visit: 01/12/2024 Next Visit: 07/26/2024 Last Refill: 01/14/2024  Please Advise

## 2024-06-01 ENCOUNTER — Telehealth: Payer: Self-pay

## 2024-06-01 ENCOUNTER — Other Ambulatory Visit (HOSPITAL_COMMUNITY): Payer: Self-pay

## 2024-06-01 NOTE — Telephone Encounter (Signed)
 Pharmacy Patient Advocate Encounter  Received notification from [facility] that Prior Authorization for amphetamine -dextroamphetamine has been APPROVED from 06/01/2024 to 06/01/2025. Ran test claim, Copay is $82.54. This test claim was processed through [facility]- copay amounts may vary at other pharmacies due to pharmacy/plan contracts, or as the patient moves through the different stages of their insurance plan.   PA #/Case ID/Reference #: [id number]

## 2024-07-26 ENCOUNTER — Encounter: Admitting: Family Medicine
